# Patient Record
Sex: Female | Born: 1935 | Race: White | Hispanic: No | Smoking: Former smoker
Health system: Southern US, Community
[De-identification: ages and names within clinical notes are randomized; demographics above are authoritative.]

## PROBLEM LIST (undated history)

## (undated) DIAGNOSIS — I4891 Unspecified atrial fibrillation: Secondary | ICD-10-CM

## (undated) DIAGNOSIS — M1990 Unspecified osteoarthritis, unspecified site: Secondary | ICD-10-CM

## (undated) DIAGNOSIS — I10 Essential (primary) hypertension: Secondary | ICD-10-CM

## (undated) DIAGNOSIS — C801 Malignant (primary) neoplasm, unspecified: Secondary | ICD-10-CM

## (undated) DIAGNOSIS — F32A Depression, unspecified: Secondary | ICD-10-CM

## (undated) DIAGNOSIS — L900 Lichen sclerosus et atrophicus: Secondary | ICD-10-CM

## (undated) HISTORY — DX: Unspecified osteoarthritis, unspecified site: M19.90

## (undated) HISTORY — PX: APPENDECTOMY: SHX54

## (undated) HISTORY — PX: HERNIA REPAIR: SHX51

## (undated) HISTORY — DX: Malignant (primary) neoplasm, unspecified: C80.1

## (undated) HISTORY — DX: Lichen sclerosus et atrophicus: L90.0

## (undated) HISTORY — PX: TONSILLECTOMY: SUR1361

## (undated) HISTORY — PX: BOWEL RESECTION: SHX1257

---

## 1998-10-13 ENCOUNTER — Ambulatory Visit (HOSPITAL_COMMUNITY): Admission: RE | Admit: 1998-10-13 | Discharge: 1998-10-13 | Payer: Self-pay | Admitting: Obstetrics & Gynecology

## 2015-04-01 DIAGNOSIS — R928 Other abnormal and inconclusive findings on diagnostic imaging of breast: Secondary | ICD-10-CM | POA: Diagnosis not present

## 2015-04-19 DIAGNOSIS — H25813 Combined forms of age-related cataract, bilateral: Secondary | ICD-10-CM | POA: Diagnosis not present

## 2015-04-19 DIAGNOSIS — H3531 Nonexudative age-related macular degeneration: Secondary | ICD-10-CM | POA: Diagnosis not present

## 2015-06-18 DIAGNOSIS — R350 Frequency of micturition: Secondary | ICD-10-CM | POA: Diagnosis not present

## 2015-06-18 DIAGNOSIS — N390 Urinary tract infection, site not specified: Secondary | ICD-10-CM | POA: Diagnosis not present

## 2015-06-18 DIAGNOSIS — A499 Bacterial infection, unspecified: Secondary | ICD-10-CM | POA: Diagnosis not present

## 2015-06-18 DIAGNOSIS — J018 Other acute sinusitis: Secondary | ICD-10-CM | POA: Diagnosis not present

## 2015-08-16 DIAGNOSIS — L900 Lichen sclerosus et atrophicus: Secondary | ICD-10-CM | POA: Diagnosis not present

## 2015-08-16 DIAGNOSIS — L570 Actinic keratosis: Secondary | ICD-10-CM | POA: Diagnosis not present

## 2015-09-04 DIAGNOSIS — C801 Malignant (primary) neoplasm, unspecified: Secondary | ICD-10-CM

## 2015-09-04 HISTORY — PX: BREAST LUMPECTOMY: SHX2

## 2015-09-04 HISTORY — DX: Malignant (primary) neoplasm, unspecified: C80.1

## 2015-09-22 DIAGNOSIS — L570 Actinic keratosis: Secondary | ICD-10-CM | POA: Diagnosis not present

## 2015-09-22 DIAGNOSIS — L821 Other seborrheic keratosis: Secondary | ICD-10-CM | POA: Diagnosis not present

## 2015-11-01 DIAGNOSIS — H353132 Nonexudative age-related macular degeneration, bilateral, intermediate dry stage: Secondary | ICD-10-CM | POA: Diagnosis not present

## 2015-11-01 DIAGNOSIS — H25813 Combined forms of age-related cataract, bilateral: Secondary | ICD-10-CM | POA: Diagnosis not present

## 2015-11-30 DIAGNOSIS — H2512 Age-related nuclear cataract, left eye: Secondary | ICD-10-CM | POA: Diagnosis not present

## 2015-12-26 DIAGNOSIS — H2512 Age-related nuclear cataract, left eye: Secondary | ICD-10-CM | POA: Diagnosis not present

## 2015-12-26 DIAGNOSIS — H25812 Combined forms of age-related cataract, left eye: Secondary | ICD-10-CM | POA: Diagnosis not present

## 2016-01-03 DIAGNOSIS — H2511 Age-related nuclear cataract, right eye: Secondary | ICD-10-CM | POA: Diagnosis not present

## 2016-01-23 DIAGNOSIS — H2511 Age-related nuclear cataract, right eye: Secondary | ICD-10-CM | POA: Diagnosis not present

## 2016-01-23 DIAGNOSIS — H25811 Combined forms of age-related cataract, right eye: Secondary | ICD-10-CM | POA: Diagnosis not present

## 2016-04-30 DIAGNOSIS — N63 Unspecified lump in breast: Secondary | ICD-10-CM | POA: Diagnosis not present

## 2016-04-30 DIAGNOSIS — N6459 Other signs and symptoms in breast: Secondary | ICD-10-CM | POA: Diagnosis not present

## 2016-04-30 DIAGNOSIS — R928 Other abnormal and inconclusive findings on diagnostic imaging of breast: Secondary | ICD-10-CM | POA: Diagnosis not present

## 2016-04-30 DIAGNOSIS — R921 Mammographic calcification found on diagnostic imaging of breast: Secondary | ICD-10-CM | POA: Diagnosis not present

## 2016-04-30 DIAGNOSIS — Z09 Encounter for follow-up examination after completed treatment for conditions other than malignant neoplasm: Secondary | ICD-10-CM | POA: Diagnosis not present

## 2016-04-30 DIAGNOSIS — N6489 Other specified disorders of breast: Secondary | ICD-10-CM | POA: Diagnosis not present

## 2016-05-11 DIAGNOSIS — C50911 Malignant neoplasm of unspecified site of right female breast: Secondary | ICD-10-CM | POA: Diagnosis not present

## 2016-05-11 DIAGNOSIS — R928 Other abnormal and inconclusive findings on diagnostic imaging of breast: Secondary | ICD-10-CM | POA: Diagnosis not present

## 2016-05-11 DIAGNOSIS — D0511 Intraductal carcinoma in situ of right breast: Secondary | ICD-10-CM | POA: Diagnosis not present

## 2016-05-16 DIAGNOSIS — C50211 Malignant neoplasm of upper-inner quadrant of right female breast: Secondary | ICD-10-CM | POA: Diagnosis not present

## 2016-05-21 DIAGNOSIS — Z803 Family history of malignant neoplasm of breast: Secondary | ICD-10-CM | POA: Diagnosis not present

## 2016-05-21 DIAGNOSIS — C50211 Malignant neoplasm of upper-inner quadrant of right female breast: Secondary | ICD-10-CM | POA: Diagnosis not present

## 2016-05-21 DIAGNOSIS — Z8249 Family history of ischemic heart disease and other diseases of the circulatory system: Secondary | ICD-10-CM | POA: Diagnosis not present

## 2016-05-21 DIAGNOSIS — Z806 Family history of leukemia: Secondary | ICD-10-CM | POA: Diagnosis not present

## 2016-05-21 DIAGNOSIS — Z87891 Personal history of nicotine dependence: Secondary | ICD-10-CM | POA: Diagnosis not present

## 2016-05-21 DIAGNOSIS — Z171 Estrogen receptor negative status [ER-]: Secondary | ICD-10-CM | POA: Diagnosis not present

## 2016-05-21 DIAGNOSIS — Z801 Family history of malignant neoplasm of trachea, bronchus and lung: Secondary | ICD-10-CM | POA: Diagnosis not present

## 2016-05-23 DIAGNOSIS — R928 Other abnormal and inconclusive findings on diagnostic imaging of breast: Secondary | ICD-10-CM | POA: Diagnosis not present

## 2016-05-23 DIAGNOSIS — C50919 Malignant neoplasm of unspecified site of unspecified female breast: Secondary | ICD-10-CM | POA: Diagnosis not present

## 2016-05-23 DIAGNOSIS — C50911 Malignant neoplasm of unspecified site of right female breast: Secondary | ICD-10-CM | POA: Diagnosis not present

## 2016-05-23 DIAGNOSIS — C50211 Malignant neoplasm of upper-inner quadrant of right female breast: Secondary | ICD-10-CM | POA: Diagnosis not present

## 2016-05-24 DIAGNOSIS — D360 Benign neoplasm of lymph nodes: Secondary | ICD-10-CM | POA: Diagnosis not present

## 2016-05-24 DIAGNOSIS — C50911 Malignant neoplasm of unspecified site of right female breast: Secondary | ICD-10-CM | POA: Diagnosis not present

## 2016-05-24 DIAGNOSIS — Z170 Estrogen receptor positive status [ER+]: Secondary | ICD-10-CM | POA: Diagnosis not present

## 2016-06-01 DIAGNOSIS — M79671 Pain in right foot: Secondary | ICD-10-CM | POA: Diagnosis not present

## 2016-06-01 DIAGNOSIS — M19042 Primary osteoarthritis, left hand: Secondary | ICD-10-CM | POA: Diagnosis not present

## 2016-06-01 DIAGNOSIS — M159 Polyosteoarthritis, unspecified: Secondary | ICD-10-CM | POA: Diagnosis not present

## 2016-06-01 DIAGNOSIS — M79641 Pain in right hand: Secondary | ICD-10-CM | POA: Diagnosis not present

## 2016-06-01 DIAGNOSIS — M19041 Primary osteoarthritis, right hand: Secondary | ICD-10-CM | POA: Diagnosis not present

## 2016-06-01 DIAGNOSIS — M19072 Primary osteoarthritis, left ankle and foot: Secondary | ICD-10-CM | POA: Diagnosis not present

## 2016-06-01 DIAGNOSIS — M79642 Pain in left hand: Secondary | ICD-10-CM | POA: Diagnosis not present

## 2016-06-01 DIAGNOSIS — M4004 Postural kyphosis, thoracic region: Secondary | ICD-10-CM | POA: Diagnosis not present

## 2016-06-01 DIAGNOSIS — M19071 Primary osteoarthritis, right ankle and foot: Secondary | ICD-10-CM | POA: Diagnosis not present

## 2016-06-01 DIAGNOSIS — M79672 Pain in left foot: Secondary | ICD-10-CM | POA: Diagnosis not present

## 2016-06-19 DIAGNOSIS — Z170 Estrogen receptor positive status [ER+]: Secondary | ICD-10-CM | POA: Diagnosis not present

## 2016-06-19 DIAGNOSIS — C50411 Malignant neoplasm of upper-outer quadrant of right female breast: Secondary | ICD-10-CM | POA: Diagnosis not present

## 2016-07-04 DIAGNOSIS — C50211 Malignant neoplasm of upper-inner quadrant of right female breast: Secondary | ICD-10-CM | POA: Diagnosis not present

## 2016-07-04 DIAGNOSIS — Z79811 Long term (current) use of aromatase inhibitors: Secondary | ICD-10-CM | POA: Diagnosis not present

## 2016-07-04 DIAGNOSIS — Z87891 Personal history of nicotine dependence: Secondary | ICD-10-CM | POA: Diagnosis not present

## 2016-07-04 DIAGNOSIS — M1990 Unspecified osteoarthritis, unspecified site: Secondary | ICD-10-CM | POA: Diagnosis not present

## 2016-07-04 DIAGNOSIS — Z170 Estrogen receptor positive status [ER+]: Secondary | ICD-10-CM | POA: Diagnosis not present

## 2016-07-04 DIAGNOSIS — Z780 Asymptomatic menopausal state: Secondary | ICD-10-CM | POA: Diagnosis not present

## 2016-07-04 DIAGNOSIS — Z803 Family history of malignant neoplasm of breast: Secondary | ICD-10-CM | POA: Diagnosis not present

## 2016-07-31 DIAGNOSIS — C50211 Malignant neoplasm of upper-inner quadrant of right female breast: Secondary | ICD-10-CM | POA: Diagnosis not present

## 2016-07-31 DIAGNOSIS — M85852 Other specified disorders of bone density and structure, left thigh: Secondary | ICD-10-CM | POA: Diagnosis not present

## 2016-07-31 DIAGNOSIS — Z780 Asymptomatic menopausal state: Secondary | ICD-10-CM | POA: Diagnosis not present

## 2016-07-31 DIAGNOSIS — M810 Age-related osteoporosis without current pathological fracture: Secondary | ICD-10-CM | POA: Diagnosis not present

## 2016-10-09 DIAGNOSIS — Z803 Family history of malignant neoplasm of breast: Secondary | ICD-10-CM | POA: Diagnosis not present

## 2016-10-09 DIAGNOSIS — Z170 Estrogen receptor positive status [ER+]: Secondary | ICD-10-CM | POA: Diagnosis not present

## 2016-10-09 DIAGNOSIS — Z79811 Long term (current) use of aromatase inhibitors: Secondary | ICD-10-CM | POA: Diagnosis not present

## 2016-10-09 DIAGNOSIS — C50211 Malignant neoplasm of upper-inner quadrant of right female breast: Secondary | ICD-10-CM | POA: Diagnosis not present

## 2016-10-09 DIAGNOSIS — Z87891 Personal history of nicotine dependence: Secondary | ICD-10-CM | POA: Diagnosis not present

## 2017-03-26 DIAGNOSIS — H25813 Combined forms of age-related cataract, bilateral: Secondary | ICD-10-CM | POA: Diagnosis not present

## 2017-03-26 DIAGNOSIS — H26493 Other secondary cataract, bilateral: Secondary | ICD-10-CM | POA: Diagnosis not present

## 2017-03-26 DIAGNOSIS — H353132 Nonexudative age-related macular degeneration, bilateral, intermediate dry stage: Secondary | ICD-10-CM | POA: Diagnosis not present

## 2017-06-27 DIAGNOSIS — R928 Other abnormal and inconclusive findings on diagnostic imaging of breast: Secondary | ICD-10-CM | POA: Diagnosis not present

## 2017-06-27 DIAGNOSIS — N6489 Other specified disorders of breast: Secondary | ICD-10-CM | POA: Diagnosis not present

## 2017-06-27 DIAGNOSIS — C50211 Malignant neoplasm of upper-inner quadrant of right female breast: Secondary | ICD-10-CM | POA: Diagnosis not present

## 2017-07-04 DIAGNOSIS — Z79811 Long term (current) use of aromatase inhibitors: Secondary | ICD-10-CM | POA: Diagnosis not present

## 2017-07-04 DIAGNOSIS — C50211 Malignant neoplasm of upper-inner quadrant of right female breast: Secondary | ICD-10-CM | POA: Diagnosis not present

## 2017-07-04 DIAGNOSIS — R232 Flushing: Secondary | ICD-10-CM | POA: Diagnosis not present

## 2017-07-04 DIAGNOSIS — Z483 Aftercare following surgery for neoplasm: Secondary | ICD-10-CM | POA: Diagnosis not present

## 2017-07-04 DIAGNOSIS — Z170 Estrogen receptor positive status [ER+]: Secondary | ICD-10-CM | POA: Diagnosis not present

## 2017-07-04 DIAGNOSIS — M8580 Other specified disorders of bone density and structure, unspecified site: Secondary | ICD-10-CM | POA: Diagnosis not present

## 2017-08-26 DIAGNOSIS — H9191 Unspecified hearing loss, right ear: Secondary | ICD-10-CM | POA: Diagnosis not present

## 2017-08-26 DIAGNOSIS — H6121 Impacted cerumen, right ear: Secondary | ICD-10-CM | POA: Diagnosis not present

## 2017-10-08 DIAGNOSIS — M19011 Primary osteoarthritis, right shoulder: Secondary | ICD-10-CM | POA: Diagnosis not present

## 2017-10-08 DIAGNOSIS — R52 Pain, unspecified: Secondary | ICD-10-CM | POA: Diagnosis not present

## 2018-01-16 DIAGNOSIS — Z79811 Long term (current) use of aromatase inhibitors: Secondary | ICD-10-CM | POA: Diagnosis not present

## 2018-01-16 DIAGNOSIS — M8580 Other specified disorders of bone density and structure, unspecified site: Secondary | ICD-10-CM | POA: Diagnosis not present

## 2018-01-16 DIAGNOSIS — C50211 Malignant neoplasm of upper-inner quadrant of right female breast: Secondary | ICD-10-CM | POA: Diagnosis not present

## 2018-01-16 DIAGNOSIS — N951 Menopausal and female climacteric states: Secondary | ICD-10-CM | POA: Diagnosis not present

## 2018-01-16 DIAGNOSIS — Z170 Estrogen receptor positive status [ER+]: Secondary | ICD-10-CM | POA: Diagnosis not present

## 2018-01-16 DIAGNOSIS — Z853 Personal history of malignant neoplasm of breast: Secondary | ICD-10-CM | POA: Diagnosis not present

## 2018-01-16 DIAGNOSIS — Z87891 Personal history of nicotine dependence: Secondary | ICD-10-CM | POA: Diagnosis not present

## 2018-03-19 NOTE — Progress Notes (Signed)
[place] at [facility] [address] [place], [place] [address] [phone]  Date:  03/20/2018   Name:  Wendy Hebert   DOB:  Nov 30, 1932   MRN:  [id number]  PCP:  Darreld Mclean, MD    Chief Complaint: New Patient (Initial Visit) (mobility issue, trouble walking for about a year now)   History of Present Illness:  Wendy Hebert is a 84 y.o. very pleasant female patient who presents with the following:  She is from [place]. Moved to this area to attend [employer].  Moved away for a few years, but has spent most of her life here  She is married to Wendy Hebert They have 1 son who lives in [place] and a 108 yo grandson She works part time for the [employer]   She had a localized breast cancer and did a lumpectomy in 2017 No radiation or chemo needed   Her oncologist is Dr. Humphrey Rolls She does really see any other specialists  She has been in "super great health" until the last year.  Over the last year she has felt like walking is becoming more difficult for her She has had a hard time taking any estrogen inhibitors due to difficulty walking- she feels "like I just can't move" but has a hard time describing this as stiffness or pain   Apparently she was told that she had spine arthritis years ago Current sx have been going on for 8-9 months. She stopped all oncology meds on 6/10 She has not noted any improvement since she stopped the meds She is not sure if she is getting worse She does yoga once a week, and did a post- cancer fitness program as well in the past but stopped over the winter and never went back  She is not really using NSAIDs or other OTC meds on a regular basis as she is afraid of SE She will use aleve when "I'm really desperate"   She did have some labs done in May of this years- CMP, CBC- minor decrease in her GFR  She did see ortho back in February and had a shot in her shoulder but it did not seem to help    She does have lichen sclerosis on her pubic area - she uses clobetasol topically for this.  She was getting this from her GYN but wonders if I can refill it which is fine   Former smoker Not a heavy drinker   Patient Active Problem List   Diagnosis Date Noted  . Gait difficulty 03/20/2018  . Lichen sclerosus 32/95/1884  . History of right breast cancer 03/20/2018    Past Medical History:  Diagnosis Date  . Arthritis   . Cancer ([place]) 2017   1.6 lumpectomy  . Lichen sclerosus     Past Surgical History:  Procedure Laterality Date  . BOWEL RESECTION    . BREAST LUMPECTOMY  2017  . CESAREAN SECTION    . HERNIA REPAIR    . TONSILLECTOMY      Social History   Tobacco Use  . Smoking status: Former Research scientist (life sciences)  . Smokeless tobacco: Never Used  . Tobacco comment: 30+ years  Substance Use Topics  . Alcohol use: Yes    Comment: occasionally  . Drug use: Never    History reviewed. No pertinent family history.  Not on File  Medication list has been reviewed and updated.  Current Outpatient Medications on File  Prior to Visit  Medication Sig Dispense Refill  . Calcium 600-200 MG-UNIT tablet Take 1 tablet by mouth daily.    . Cholecalciferol (D3-50 PO) Take by mouth.    . DOCOSAHEXAENOIC ACID PO Take 1 g by mouth.    . Multiple Vitamin (MULTI-VITAMINS) TABS Take by mouth.    . Multiple Vitamins-Minerals (ICAPS AREDS 2 PO) Take by mouth.     No current facility-administered medications on file prior to visit.     Review of Systems:  As per HPI- otherwise negative. No fever or chills No CP or sob Denies any falls or any serious fear of falling    Physical Examination: Vitals:   03/20/18 1408  BP: 136/78  Pulse: 79  Resp: 16  SpO2: 98%   Vitals:   03/20/18 1408  Weight: 148 lb (67.1 kg)  Height: 5' 1.75" (1.568 m)   Body mass index is 27.29 kg/m. Ideal Body Weight: Weight in (lb) to have BMI = 25: 135.3  GEN: WDWN, NAD, Non-toxic, A & O x 3, well  appearing older lady HEENT: Atraumatic, Normocephalic. Neck supple. No masses, No LAD.  Bilateral TM wnl, oropharynx normal.  PEERL,EOMI.   Ears and Nose: No external deformity. CV: RRR, No M/G/R. No JVD. No thrill. No extra heart sounds. PULM: CTA B, no wheezes, crackles, rhonchi. No retractions. No resp. distress. No accessory muscle use. ABD: S, NT, ND EXTR: No c/c/e NEURO her gait is slow but otherwise appears normal Normal strength of her extremities She has difficulty abducting her right shoulder due to shoulder injury  PSYCH: Normally interactive. Conversant. Not depressed or anxious appearing.  Calm demeanor.    Assessment and Plan: Gait difficulty - Plan: Ambulatory referral to Neurology  Lichen sclerosus  History of right breast cancer  Encounter for medical examination to establish care  Establishing care today History of breast cancer s/p treatment.  Pt attributes onset of her current gait difficulty with use of post- cancer medications.  We are not sure if there is really a connection. May be due to deconditioning and concern about falls.  In any case will refer to neurology for an opinion regarding her gait.  In the meantime encouraged her to be as active and mobile as possible and she will try   Signed Lamar Blinks, MD

## 2018-03-20 ENCOUNTER — Ambulatory Visit: Payer: Medicare Other | Admitting: Family Medicine

## 2018-03-20 ENCOUNTER — Encounter: Payer: Self-pay | Admitting: Family Medicine

## 2018-03-20 VITALS — BP 136/78 | HR 79 | Resp 16 | Ht 61.75 in | Wt 148.0 lb

## 2018-03-20 DIAGNOSIS — Z0000 Encounter for general adult medical examination without abnormal findings: Secondary | ICD-10-CM

## 2018-03-20 DIAGNOSIS — Z853 Personal history of malignant neoplasm of breast: Secondary | ICD-10-CM | POA: Insufficient documentation

## 2018-03-20 DIAGNOSIS — L900 Lichen sclerosus et atrophicus: Secondary | ICD-10-CM | POA: Diagnosis not present

## 2018-03-20 DIAGNOSIS — R269 Unspecified abnormalities of gait and mobility: Secondary | ICD-10-CM | POA: Diagnosis not present

## 2018-03-20 NOTE — Patient Instructions (Addendum)
I am going to refer you to neurology to help [place] figure out why your gait has slowed.  Also, I think you are ok to use tylenol as needed, and also naprosyn for more severe pain- be more sparing with this med  Please go back to your cancer fit exercise program  Also continue to do your yoga program and try to keep moving as much as you are able   Please come and see me in about 4 months to check in, sooner if you need anything

## 2018-03-21 ENCOUNTER — Encounter: Payer: Self-pay | Admitting: Family Medicine

## 2018-03-26 ENCOUNTER — Encounter: Payer: Self-pay | Admitting: Neurology

## 2018-04-01 DIAGNOSIS — H353132 Nonexudative age-related macular degeneration, bilateral, intermediate dry stage: Secondary | ICD-10-CM | POA: Diagnosis not present

## 2018-04-01 DIAGNOSIS — H25813 Combined forms of age-related cataract, bilateral: Secondary | ICD-10-CM | POA: Diagnosis not present

## 2018-04-01 DIAGNOSIS — H26493 Other secondary cataract, bilateral: Secondary | ICD-10-CM | POA: Diagnosis not present

## 2018-05-21 NOTE — Progress Notes (Signed)
NEUROLOGY CONSULTATION NOTE  Wendy Hebert MRN: [id number] DOB: 1933/01/21  Referring provider: Lamar Blinks, MD Primary care provider: Lamar Blinks, MD  Reason for consult:  Abnormal gait  HISTORY OF PRESENT ILLNESS: Wendy Hebert is an 84 year old female with arthritis and history of breast cancer who presents for abnormal gait.  She is accompanied by her husband who supplements history.  History also supplemented by referring provider's note.  She was treated for breast cancer in late 2017 and started anastrozole.  She reports difficulty with ambulating since .  She just couldn't move her legs.  She reports associated pain but she couldn't move due to immobility,not due to the pain.  She denies dizziness, visual disturbance, muscle weakness or fatigue.  She denies tremor.  She thinks it is due to her hormone therapy.  PAST MEDICAL HISTORY: Past Medical History:  Diagnosis Date  . Arthritis   . Cancer ([place]) 2017   1.6 lumpectomy  . Lichen sclerosus     PAST SURGICAL HISTORY: Past Surgical History:  Procedure Laterality Date  . BOWEL RESECTION    . BREAST LUMPECTOMY  2017  . CESAREAN SECTION    . HERNIA REPAIR    . TONSILLECTOMY      MEDICATIONS: Current Outpatient Medications on File Prior to Visit  Medication Sig Dispense Refill  . Calcium 600-200 MG-UNIT tablet Take 1 tablet by mouth daily.    . Cholecalciferol (D3-50 PO) Take by mouth.    . DOCOSAHEXAENOIC ACID PO Take 1 g by mouth.    . Multiple Vitamin (MULTI-VITAMINS) TABS Take by mouth.    . Multiple Vitamins-Minerals (ICAPS AREDS 2 PO) Take by mouth.     No current facility-administered medications on file prior to visit.     ALLERGIES: Not on File  FAMILY HISTORY: No family history on file.   SOCIAL HISTORY: Social History   Socioeconomic History  . Marital status: Married    Spouse name: Not on file  . Number of children: Not on file  . Years of education: Not on file  . Highest  education level: Not on file  Occupational History  . Not on file  Social Needs  . Financial resource strain: Not on file  . Food insecurity:    Worry: Not on file    Inability: Not on file  . Transportation needs:    Medical: Not on file    Non-medical: Not on file  Tobacco Use  . Smoking status: Former Research scientist (life sciences)  . Smokeless tobacco: Never Used  . Tobacco comment: 30+ years  Substance and Sexual Activity  . Alcohol use: Yes    Comment: occasionally  . Drug use: Never  . Sexual activity: Not on file  Lifestyle  . Physical activity:    Days per week: Not on file    Minutes per session: Not on file  . Stress: Not on file  Relationships  . Social connections:    Talks on phone: Not on file    Gets together: Not on file    Attends religious service: Not on file    Active member of club or organization: Not on file    Attends meetings of clubs or organizations: Not on file    Relationship status: Not on file  . Intimate partner violence:    Fear of current or ex partner: Not on file    Emotionally abused: Not on file    Physically abused: Not on file    Forced sexual  activity: Not on file  Other Topics Concern  . Not on file  Social History Narrative  . Not on file    REVIEW OF SYSTEMS: Constitutional: No fevers, chills, or sweats, no generalized fatigue, change in appetite Eyes: No visual changes, double vision, eye pain Ear, nose and throat: No hearing loss, ear pain, nasal congestion, sore throat Cardiovascular: No chest pain, palpitations Respiratory:  No shortness of breath at rest or with exertion, wheezes GastrointestinaI: No nausea, vomiting, diarrhea, abdominal pain, fecal incontinence Genitourinary:  No dysuria, urinary retention or frequency Musculoskeletal:  No neck pain, back pain Integumentary: No rash, pruritus, skin lesions Neurological: as above Psychiatric: No depression, insomnia, anxiety Endocrine: No palpitations, fatigue, diaphoresis, mood  swings, change in appetite, change in weight, increased thirst Hematologic/Lymphatic:  No purpura, petechiae. Allergic/Immunologic: no itchy/runny eyes, nasal congestion, recent allergic reactions, rashes  PHYSICAL EXAM: Blood pressure 132/70, pulse 84, height 5' 1.5" (1.562 m), weight 148 lb (67.1 kg), SpO2 97 %. General: No acute distress.  Patient appears well-groomed.   Head:  Normocephalic/atraumatic Eyes:  fundi examined but not visualized Neck: supple, no paraspinal tenderness, full range of motion Back: No paraspinal tenderness Heart: regular rate and rhythm Lungs: Clear to auscultation bilaterally. Vascular: No carotid bruits. Neurological Exam: Mental status: alert and oriented to person, place, and time, recent and remote memory intact, fund of knowledge intact, attention and concentration intact, speech fluent and not dysarthric, language intact. Cranial nerves: CN I: not tested CN II: pupils equal, round and reactive to light, visual fields intact CN III, IV, VI:  full range of motion, no nystagmus, no ptosis CN V: facial sensation intact CN VII: upper and lower face symmetric CN VIII: hearing intact CN IX, X: gag intact, uvula midline CN XI: sternocleidomastoid and trapezius muscles intact CN XII: tongue midline Bulk & Tone: normal, no fasciculations. Motor:  5/5 throughout, no bradykinesia Sensation:  pinprick and vibration sensation intact. Deep Tendon Reflexes:  2+ throughout, toes downgoing.  Finger to nose testing:  Without dysmetria.  Heel to shin:  Without dysmetria.  Gait:  Mildly wide-based but overall normal station and stride.  Able to turn and tandem walk. Romberg negative.  IMPRESSION: Abnormal gait.  Specifically she reports at times not able to move her legs, possibly describing freezing.  She does not exhibit any freezing or parkinsonism on exam.  She does not exhibit any focal or symmetric weakness, neuropathy, radiculopathy, lumbar stenosis, or signs  of myelopathy.  Her neurologic exam is unremarkable.  PLAN: As her exam is unremarkable, I have no further recommendations.  She participates in yoga and I encourage her to continue.  If she should have any worsening symptoms, I advised her to make a follow up appointment for re-evaluation.  Thank you for allowing me to take part in the care of this patient.  40 minutes spent face to face with patient, over 50% spent discussing management.  Metta Clines, DO  CC: Lamar Blinks, MD

## 2018-05-22 ENCOUNTER — Ambulatory Visit: Payer: Medicare Other | Admitting: Neurology

## 2018-05-22 ENCOUNTER — Encounter

## 2018-05-22 ENCOUNTER — Encounter: Payer: Self-pay | Admitting: Neurology

## 2018-05-22 VITALS — BP 132/70 | HR 84 | Ht 61.5 in | Wt 148.0 lb

## 2018-05-22 DIAGNOSIS — R269 Unspecified abnormalities of gait and mobility: Secondary | ICD-10-CM

## 2018-05-22 NOTE — Patient Instructions (Signed)
I don't appreciate any concerns on your exam.  If symptoms worsen, please follow up for re-evaluation

## 2018-06-04 DIAGNOSIS — Z170 Estrogen receptor positive status [ER+]: Secondary | ICD-10-CM | POA: Diagnosis not present

## 2018-06-04 DIAGNOSIS — M8580 Other specified disorders of bone density and structure, unspecified site: Secondary | ICD-10-CM | POA: Diagnosis not present

## 2018-06-04 DIAGNOSIS — C50211 Malignant neoplasm of upper-inner quadrant of right female breast: Secondary | ICD-10-CM | POA: Diagnosis not present

## 2018-06-04 DIAGNOSIS — M791 Myalgia, unspecified site: Secondary | ICD-10-CM | POA: Diagnosis not present

## 2018-06-04 DIAGNOSIS — R52 Pain, unspecified: Secondary | ICD-10-CM | POA: Diagnosis not present

## 2018-06-12 DIAGNOSIS — M47816 Spondylosis without myelopathy or radiculopathy, lumbar region: Secondary | ICD-10-CM | POA: Diagnosis not present

## 2018-06-12 DIAGNOSIS — M2550 Pain in unspecified joint: Secondary | ICD-10-CM | POA: Diagnosis not present

## 2018-06-12 DIAGNOSIS — M1990 Unspecified osteoarthritis, unspecified site: Secondary | ICD-10-CM | POA: Diagnosis not present

## 2018-06-12 DIAGNOSIS — R29898 Other symptoms and signs involving the musculoskeletal system: Secondary | ICD-10-CM | POA: Diagnosis not present

## 2018-07-03 LAB — CBC AND DIFFERENTIAL
HEMATOCRIT: 41 (ref 36–46)
Hemoglobin: 14.1 (ref 12.0–16.0)
Platelets: 271 (ref 150–399)
WBC: 6.7

## 2018-07-03 LAB — LIPID PANEL
CHOLESTEROL: 188 (ref 0–200)
HDL: 71 — AB (ref 35–70)
LDL Cholesterol: 93
TRIGLYCERIDES: 120 (ref 40–160)

## 2018-07-03 LAB — BASIC METABOLIC PANEL
BUN: 16 (ref 4–21)
CREATININE: 1.1 (ref 0.5–1.1)
Glucose: 100
POTASSIUM: 4.5 (ref 3.4–5.3)
SODIUM: 141 (ref 137–147)

## 2018-07-03 LAB — HEPATIC FUNCTION PANEL
ALT: 20 (ref 7–35)
AST: 21 (ref 13–35)
Alkaline Phosphatase: 83 (ref 25–125)
BILIRUBIN, TOTAL: 0.5

## 2018-07-03 LAB — VITAMIN D 25 HYDROXY (VIT D DEFICIENCY, FRACTURES): Vit D, 25-Hydroxy: 38.5

## 2018-07-03 LAB — VITAMIN B12: VITAMIN B 12: 1501

## 2018-07-15 ENCOUNTER — Encounter: Payer: Self-pay | Admitting: Family Medicine

## 2018-07-15 DIAGNOSIS — Z9889 Other specified postprocedural states: Secondary | ICD-10-CM | POA: Diagnosis not present

## 2018-07-15 DIAGNOSIS — Z79811 Long term (current) use of aromatase inhibitors: Secondary | ICD-10-CM | POA: Diagnosis not present

## 2018-07-15 DIAGNOSIS — Z170 Estrogen receptor positive status [ER+]: Secondary | ICD-10-CM | POA: Diagnosis not present

## 2018-07-15 DIAGNOSIS — C50211 Malignant neoplasm of upper-inner quadrant of right female breast: Secondary | ICD-10-CM | POA: Diagnosis not present

## 2018-07-15 DIAGNOSIS — R928 Other abnormal and inconclusive findings on diagnostic imaging of breast: Secondary | ICD-10-CM | POA: Diagnosis not present

## 2018-07-15 DIAGNOSIS — Z0000 Encounter for general adult medical examination without abnormal findings: Secondary | ICD-10-CM | POA: Diagnosis not present

## 2018-07-20 NOTE — Progress Notes (Signed)
[place] at [facility] [address], [place], [place] [address] [phone]  Date:  07/21/2018   Name:  Wendy Hebert   DOB:  07/09/1933   MRN:  [id number]  PCP:  Darreld Mclean, MD    Chief Complaint: Gait Difficulty (4 month follow up)   History of Present Illness:  Wendy Hebert is a 84 y.o. very pleasant female patient who presents with the following:  Periodic follow-up visit today Last seen here in July as a new patient at which time she was having a hard time walking: She had a localized breast cancer and did a lumpectomy in 2017 No radiation or chemo needed Her oncologist is Dr. Humphrey Rolls She does really see any other specialists  She has been in "super great health" until the last year.  Over the last year she has felt like walking is becoming more difficult for her She has had a hard time taking any estrogen inhibitors due to difficulty walking- she feels "like I just can't move" but has a hard time describing this as stiffness or pain  Apparently she was told that she had spine arthritis years ago Current sx have been going on for 8-9 months. She stopped all oncology meds on 6/10 She has not noted any improvement since she stopped the meds She is not sure if she is getting worse She does yoga once a week, and did a post- cancer fitness program as well in the past but stopped over the winter and never went back//////////////////////////////////////// History of breast cancer s/p treatment.  Pt attributes onset of her current gait difficulty with use of post- cancer medications.  We are not sure if there is really a connection. May be due to deconditioning and concern about falls.  In any case will refer to neurology for an opinion regarding her gait.  In the meantime encouraged her to be as active and mobile as possible and she will try   She did see neurology, Dr. Tomi Likens- from their visit in  September: IMPRESSION: Abnormal gait.  Specifically she reports at times not able to move her legs, possibly describing freezing.  She does not exhibit any freezing or parkinsonism on exam.  She does not exhibit any focal or symmetric weakness, neuropathy, radiculopathy, lumbar stenosis, or signs of myelopathy.  Her neurologic exam is unremarkable. PLAN: As her exam is unremarkable, I have no further recommendations.  She participates in yoga and I encourage her to continue.  If she should have any worsening symptoms, I advised her to make a follow up appointment for re-evaluation.  shingrix- she is not interested in doing this   She is part of a study regarding her mobility through [place] She takes naprosyn as needed  She does have pains in her joints, but this is not the entire problem - she may also just fee like "my legs are made of lead"   Recent mammogram was normal  She is not taking any estrogen inhibitor currently - she is not interested in doing any further therapy at this time Per most recent oncology note: Assessment and Plan: 84 year old female with 1. 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 Final pathology revealed a T1 disease. Nodes were negative. Patient's tumor was ER +99% PR +99% HER-2/neu was equivocal. Oncotype DX revealed a low risk score. Patient was offered adjuvant radiation therapy but she has declined. Patient had been  on tamoxifen 20 mg daily. But now she is declining any further therapy. She was having vaginal discharge because of the tamoxifen. She did not notice any blood. But she is very concerned about the discharge and all of the side effects associated. Extensive discussion regarding the need for adjuvant therapy since she has not had adjuvant radiation therapy and her risk of recurrence. 2. Myalgias and arthralgias: Unclear etiology. We will refer her to rheumatology 3. Bone health: Bone density scan in November 2017 showed  osteopenia. Patient declines bisphosphonates. 4. Follow-up: Per patient wishes she would like to come in once a year. She will call us if she needs to be seen sooner Patient states she understands and agrees with today's plan. I answered all questions. She knows to call with any questions or concerns should they arise prior to the next scheduled appointment. Time spent in appointment 25 minutes with greater than 50% of the time spent in counseling and coordination of care. Thank you very much for referral of this delightful lady. I look forward to working with you.  She is also seeing rheumatology at [facility]  Flu shot is done  Bone density:  Done in 2017, she does not wish to repeat today   She did have pneumonia vaccine 4-5 years ago   [place]- nothing  Patient Active Problem List   Diagnosis Date Noted  . Gait difficulty 03/20/2018  . Lichen sclerosus 82/42/3536  . History of right breast cancer 03/20/2018    Past Medical History:  Diagnosis Date  . Arthritis   . Cancer ([place]) 2017   1.6 lumpectomy  . Lichen sclerosus     Past Surgical History:  Procedure Laterality Date  . BOWEL RESECTION    . BREAST LUMPECTOMY  2017  . CESAREAN SECTION    . HERNIA REPAIR    . TONSILLECTOMY      Social History   Tobacco Use  . Smoking status: Former Research scientist (life sciences)  . Smokeless tobacco: Never Used  . Tobacco comment: 30+ years  Substance Use Topics  . Alcohol use: Yes    Comment: occasionally  . Drug use: Never    History reviewed. No pertinent family history.  Not on File  Medication list has been reviewed and updated.  Current Outpatient Medications on File Prior to Visit  Medication Sig Dispense Refill  . Ascorbic Acid (VITAMIN C) 1000 MG tablet Take 1,000 mg by mouth daily.    . Calcium 600-200 MG-UNIT tablet Take 1 tablet by mouth daily.    . Cholecalciferol (D3-50 PO) Take by mouth.    . Multiple Vitamin (MULTI-VITAMINS) TABS Take by mouth.    . Omega-3 Fatty Acids (RA FISH OIL)  1000 MG CAPS Take 3 capsules by mouth daily.      No current facility-administered medications on file prior to visit.     Review of Systems:  As per HPI- otherwise negative.   Physical Examination: Vitals:   07/21/18 1411  BP: 136/60  Pulse: 86  Resp: 16  SpO2: 99%   Vitals:   07/21/18 1411  Weight: 150 lb (68 kg)  Height: 5' 1.5" (1.562 m)   Body mass index is 27.88 kg/m. Ideal Body Weight: Weight in (lb) to have BMI = 25: 134.2  GEN: WDWN, NAD, Non-toxic, A & O x 3, overweight, looks well  HEENT: Atraumatic, Normocephalic. Neck supple. No masses, No LAD. Ears and Nose: No external deformity. CV: RRR, No M/G/R. No JVD. No thrill. No extra heart sounds. PULM: CTA  B, no wheezes, crackles, rhonchi. No retractions. No resp. distress. No accessory muscle use. ABD: S, NT, ND EXTR: No c/c/e NEURO Normal gait PSYCH: Normally interactive. Conversant. Not depressed or anxious appearing.  Calm demeanor.  Pt also mentiones vaginal discharge thought due to tamoxifen use- no bleeding Non- speculum exam today is normal for age. Does show atrophy  AK on her left shin x2 and on her left brow x1 VC obtained LN cryotherapy to AK x3 each Pt tolerated well, no blood loss and no complications   Assessment and Plan: Arthralgia, unspecified joint - Plan: traMADol (ULTRAM) 50 MG tablet, DISCONTINUED: traMADol (ULTRAM) 50 MG tablet  Actinic keratitis, unspecified laterality  Vaginal discharge  Following up today She notes diffuse joint pains and stiffness Will have her try tramadol as needed-cautioned regarding sedation, use sparingly for worst pain days  She plans to see GYN if her vaginal discharge does not resolve now that she is off tamoxifen    Signed Lamar Blinks, MD

## 2018-07-21 ENCOUNTER — Ambulatory Visit (INDEPENDENT_AMBULATORY_CARE_PROVIDER_SITE_OTHER): Payer: Medicare Other | Admitting: Family Medicine

## 2018-07-21 ENCOUNTER — Encounter: Payer: Self-pay | Admitting: Family Medicine

## 2018-07-21 VITALS — BP 136/60 | HR 86 | Resp 16 | Ht 61.5 in | Wt 150.0 lb

## 2018-07-21 DIAGNOSIS — N898 Other specified noninflammatory disorders of vagina: Secondary | ICD-10-CM | POA: Diagnosis not present

## 2018-07-21 DIAGNOSIS — H16139 Photokeratitis, unspecified eye: Secondary | ICD-10-CM | POA: Diagnosis not present

## 2018-07-21 DIAGNOSIS — M2550 Pain in unspecified joint: Secondary | ICD-10-CM

## 2018-07-21 MED ORDER — TRAMADOL HCL 50 MG PO TABS: 50.0000 mg | ORAL_TABLET | Freq: Two times a day (BID) | ORAL | 0 refills | Status: DC | PRN

## 2018-07-21 NOTE — Patient Instructions (Addendum)
It was good to see you today! Take care, let's plan to visit in about 6 months  I gave you an rx for tramadol to try for pain as needed Use this med sparingly as it can be sedating or habit forming

## 2018-07-22 ENCOUNTER — Encounter: Payer: Self-pay | Admitting: Family Medicine

## 2018-07-22 LAB — CHLORIDE: Chloride: 102

## 2018-07-22 LAB — ESTIMATED GFR: EGFR (Non-African Amer.): 47

## 2018-07-22 LAB — CO2, TOTAL: Carbon Dioxide, Total: 23

## 2018-07-22 LAB — CALCIUM: CALCIUM: 9.3

## 2018-07-22 LAB — PROTEIN, TOTAL: Protein: 6.6

## 2018-08-04 DIAGNOSIS — M159 Polyosteoarthritis, unspecified: Secondary | ICD-10-CM | POA: Diagnosis not present

## 2018-08-04 DIAGNOSIS — R29898 Other symptoms and signs involving the musculoskeletal system: Secondary | ICD-10-CM | POA: Diagnosis not present

## 2018-09-17 DIAGNOSIS — Z124 Encounter for screening for malignant neoplasm of cervix: Secondary | ICD-10-CM | POA: Diagnosis not present

## 2018-09-18 DIAGNOSIS — M1612 Unilateral primary osteoarthritis, left hip: Secondary | ICD-10-CM | POA: Diagnosis not present

## 2018-09-18 DIAGNOSIS — M25511 Pain in right shoulder: Secondary | ICD-10-CM | POA: Diagnosis not present

## 2018-09-18 DIAGNOSIS — M1712 Unilateral primary osteoarthritis, left knee: Secondary | ICD-10-CM | POA: Diagnosis not present

## 2018-09-18 DIAGNOSIS — M25562 Pain in left knee: Secondary | ICD-10-CM | POA: Diagnosis not present

## 2018-09-18 DIAGNOSIS — M25552 Pain in left hip: Secondary | ICD-10-CM | POA: Diagnosis not present

## 2018-09-19 DIAGNOSIS — M1712 Unilateral primary osteoarthritis, left knee: Secondary | ICD-10-CM | POA: Diagnosis not present

## 2018-09-21 DIAGNOSIS — M6080 Other myositis, unspecified site: Secondary | ICD-10-CM | POA: Diagnosis not present

## 2018-09-21 DIAGNOSIS — W19XXXA Unspecified fall, initial encounter: Secondary | ICD-10-CM | POA: Diagnosis not present

## 2018-09-21 DIAGNOSIS — M546 Pain in thoracic spine: Secondary | ICD-10-CM | POA: Diagnosis not present

## 2018-09-23 DIAGNOSIS — M5126 Other intervertebral disc displacement, lumbar region: Secondary | ICD-10-CM | POA: Diagnosis not present

## 2018-09-23 DIAGNOSIS — R52 Pain, unspecified: Secondary | ICD-10-CM | POA: Diagnosis not present

## 2018-09-23 DIAGNOSIS — J90 Pleural effusion, not elsewhere classified: Secondary | ICD-10-CM | POA: Diagnosis not present

## 2018-09-23 DIAGNOSIS — S2241XA Multiple fractures of ribs, right side, initial encounter for closed fracture: Secondary | ICD-10-CM | POA: Diagnosis not present

## 2018-09-23 DIAGNOSIS — N390 Urinary tract infection, site not specified: Secondary | ICD-10-CM | POA: Diagnosis not present

## 2018-09-23 DIAGNOSIS — R109 Unspecified abdominal pain: Secondary | ICD-10-CM | POA: Diagnosis not present

## 2018-09-23 DIAGNOSIS — W19XXXA Unspecified fall, initial encounter: Secondary | ICD-10-CM | POA: Diagnosis not present

## 2018-09-23 DIAGNOSIS — S3991XA Unspecified injury of abdomen, initial encounter: Secondary | ICD-10-CM | POA: Diagnosis not present

## 2018-09-23 DIAGNOSIS — R1011 Right upper quadrant pain: Secondary | ICD-10-CM | POA: Diagnosis not present

## 2018-09-25 ENCOUNTER — Ambulatory Visit (INDEPENDENT_AMBULATORY_CARE_PROVIDER_SITE_OTHER): Payer: Medicare Other | Admitting: Family Medicine

## 2018-09-25 ENCOUNTER — Encounter: Payer: Self-pay | Admitting: Family Medicine

## 2018-09-25 ENCOUNTER — Telehealth: Payer: Self-pay | Admitting: Cardiovascular Disease

## 2018-09-25 ENCOUNTER — Ambulatory Visit (HOSPITAL_BASED_OUTPATIENT_CLINIC_OR_DEPARTMENT_OTHER)
Admission: RE | Admit: 2018-09-25 | Discharge: 2018-09-25 | Disposition: A | Discharge status: Home / Self Care | Payer: Medicare Other | Source: Ambulatory Visit | Attending: Family Medicine | Admitting: Family Medicine

## 2018-09-25 VITALS — BP 130/80 | HR 100 | Temp 97.6°F | Resp 16 | Ht 61.5 in | Wt 145.0 lb

## 2018-09-25 DIAGNOSIS — S2241XA Multiple fractures of ribs, right side, initial encounter for closed fracture: Secondary | ICD-10-CM

## 2018-09-25 DIAGNOSIS — K5903 Drug induced constipation: Secondary | ICD-10-CM | POA: Insufficient documentation

## 2018-09-25 DIAGNOSIS — I499 Cardiac arrhythmia, unspecified: Secondary | ICD-10-CM | POA: Diagnosis not present

## 2018-09-25 DIAGNOSIS — S2231XA Fracture of one rib, right side, initial encounter for closed fracture: Secondary | ICD-10-CM | POA: Diagnosis not present

## 2018-09-25 DIAGNOSIS — I4891 Unspecified atrial fibrillation: Secondary | ICD-10-CM

## 2018-09-25 DIAGNOSIS — S3991XA Unspecified injury of abdomen, initial encounter: Secondary | ICD-10-CM | POA: Diagnosis not present

## 2018-09-25 MED ORDER — METOPROLOL TARTRATE 25 MG PO TABS: 25.0000 mg | ORAL_TABLET | Freq: Two times a day (BID) | ORAL | 5 refills | Status: DC

## 2018-09-25 MED ORDER — DOXYCYCLINE HYCLATE 100 MG PO CAPS: 100.0000 mg | ORAL_CAPSULE | Freq: Two times a day (BID) | ORAL | 0 refills | Status: DC

## 2018-09-25 MED ORDER — APIXABAN 5 MG PO TABS: 5.0000 mg | ORAL_TABLET | Freq: Two times a day (BID) | ORAL | 5 refills | Status: DC

## 2018-09-25 NOTE — Patient Instructions (Addendum)
You might try a stool softener such as colace If not effective you might try some miralax Let me know if this does not get better   You are in atrial fibrillation- this means your heart is beating too fast and not in a regular rhythm We are going to use metoprolol to slow down your heart rate eliquis to prevent a clot  We are going to set you up to see cardiology for your atrial fib as well  You may be getting pneumonia- doxycycline to treat this  Try to take deep breaths several times a day to keep your lungs open   We will check your thyroid today to make sure this has not triggered the atrial fibrillation

## 2018-09-25 NOTE — Telephone Encounter (Signed)
Dr Oval Linsey spoke with Dr Edilia Bo regarding patient and new onset Afib. Per Dr Oval Linsey patient to be seen within 1 week. Message has been sent to Afib clinic to see about arranging appointment.

## 2018-09-25 NOTE — Progress Notes (Signed)
[place] at [facility] [address], [place], [place] [address] [phone]  Date:  09/25/2018   Name:  Wendy Hebert   DOB:  1933/02/28   MRN:  [id number]  PCP:  Darreld Mclean, MD    Chief Complaint: Constipation (have no used bathroom since 1/19) and Fall (fell saturday twice, rib pain, went to ER at [place], had CT)   History of Present Illness:  Wendy Hebert is a 85 y.o. very pleasant female patient who presents with the following:  Patient with history of breast cancer status post lumpectomy in 2017.  Otherwise generally in good health  Here today with a concern of recent fall, rib fracture, and constipation Today is Thursday Thursday one week ago she went to [facility] and they gave her oxycodone for knee pain She saw her ortho on Friday and got a shot in her knee-this did seem to help some  On Saturday she had a fall at home. She actually fell twice-  fell the first time in the garage.  She is not really sure what caused her to fall.  She did not have any neurologic symptoms to suggest a TIA or stroke.  She does not think she lost consciousness, she hit her right side/ribs but did not really seem to be hurt.  Then later that afternoon she started to fall while walking up some steps but her husband caught her. Later that night she noted pain in her right side but was not too worried about it, figured she had bruised ribs  The next day-Sunday-she went to [facility] again as the pain seemed to move form her back to her right ribs and intensified She had x-rays and they did not see a fracture on plain film  Then Monday night/early Tuesday she went to the [facility] regional ER because she was having more pain in her side- she got concerned that she might have a "kink in my bowel"  They did a CT which showed rib fractures but not any bowel abnl  IMPRESSION: 1. Nondisplaced right posterior tenth and  eleventh rib fractures. 2. Small, low-density sympathetic right pleural effusion with atelectasis. 3. No evidence of intra-abdominal injury. 4. Large left paracentral to foraminal extrusion at L3-4.  She was given IM Rocephin, Vicodin, asked to see me and also ortho regarding her bulging disc She was also given keflex for possible UTI; she is taking 500 mg TID currently  (She did have a fall about 28 years ago, and then developed "a kink" in her bowel and had a partial bowel resection.  We think this was small bowel, but I don't have records as this was so long ago.  At that time she was told her obstruction developed because of adhesions from a cesarean section)  [place] is concerned that she could develop a bowel obstruction again, she has not had a bowel movement in about 4 days She did take one laxative pill a few days ago but it not sure what it was - did not really seem to help  No belly pain She is eating normally   Creat was 1.06 at the ER  Comprehensive Metabolic Panel  Result Value Ref Range  Sodium 136 135 - 146 MMOL/L  Potassium 4.4 3.5 - 5.3 MMOL/L  Chloride 105 98 - 110 MMOL/L  CO2 22 (L) 23 - 30 MMOL/L  BUN 22 8 - 24 MG/DL  Glucose 122 (H) 70 - 99 MG/DL  Creatinine 1.06 0.50 - 1.50 MG/DL  Calcium 8.8 8.5 - 10.5 MG/DL  Total Protein 7.0 6.0 - 8.3 G/DL  Albumin 4.2 3.5 - 5.0 G/DL  Total Bilirubin 0.6 0.1 - 1.2 MG/DL  Alkaline Phosphatase 87 25 - 125 IU/L or U/L  AST (SGOT) 21 5 - 40 IU/L or U/L  ALT (SGPT) 41 5 - 50 IU/L or U/L  Anion Gap 9 4 - 14 MMOL/L  Est. GFR Non-African American 49 (L) >=60 ML/MIN/1.73 M*2  Est. GFR African American 56 (L) >=60 ML/MIN/1.73 M*2    Patient Active Problem List   Diagnosis Date Noted  . Gait difficulty 03/20/2018  . Lichen sclerosus 35/36/1443  . History of right breast cancer 03/20/2018    Past Medical History:  Diagnosis Date  . Arthritis   . Cancer ([place]) 2017   1.6 lumpectomy  . Lichen sclerosus     Past Surgical  History:  Procedure Laterality Date  . BOWEL RESECTION    . BREAST LUMPECTOMY  2017  . CESAREAN SECTION    . HERNIA REPAIR    . TONSILLECTOMY      Social History   Tobacco Use  . Smoking status: Former Research scientist (life sciences)  . Smokeless tobacco: Never Used  . Tobacco comment: 30+ years  Substance Use Topics  . Alcohol use: Yes    Comment: occasionally  . Drug use: Never    No family history on file.  Not on File  Medication list has been reviewed and updated.  Current Outpatient Medications on File Prior to Visit  Medication Sig Dispense Refill  . Ascorbic Acid (VITAMIN C) 1000 MG tablet Take 1,000 mg by mouth daily.    . Calcium 600-200 MG-UNIT tablet Take 1 tablet by mouth daily.    . Cholecalciferol (D3-50 PO) Take by mouth.    . Multiple Vitamin (MULTI-VITAMINS) TABS Take by mouth.    . Omega-3 Fatty Acids (RA FISH OIL) 1000 MG CAPS Take 3 capsules by mouth daily.     Marland Kitchen oxyCODONE-acetaminophen (PERCOCET/ROXICET) 5-325 MG tablet Take by mouth.    . traMADol (ULTRAM) 50 MG tablet Take 1 tablet (50 mg total) by mouth every 12 (twelve) hours as needed. 30 tablet 0   No current facility-administered medications on file prior to visit.     Review of Systems:  As per HPI- otherwise negative. No fever noted   Physical Examination: Vitals:   09/25/18 1414  BP: 130/80  Pulse: 100  Resp: 16  Temp: 97.6 F (36.4 C)  SpO2: 99%   Vitals:   09/25/18 1414  Weight: 145 lb (65.8 kg)  Height: 5' 1.5" (1.562 m)   Body mass index is 26.95 kg/m. Ideal Body Weight: Weight in (lb) to have BMI = 25: 134.2  GEN: WDWN, NAD, Non-toxic, A & O x 3, minimal overweight, looks well HEENT: Atraumatic, Normocephalic. Neck supple. No masses, No LAD. Bilateral TM wnl, oropharynx normal.  PEERL,EOMI.   Ears and Nose: No external deformity. CV: Irregular rate and rhythm noted today, No M/G/R. No JVD. No thrill. No extra heart sounds. PULM: CTA B, no wheezes, crackles, rhonchi. No retractions. No  resp. distress. No accessory muscle use. ABD: S, NT, ND, +BS. No rebound. No HSM.  Belly is benign to exam She is tender over the right lower ribs, but no bruises noted EXTR: No c/c/e NEURO using a wheelchair today due to pain in her ribs PSYCH: Normally interactive. Conversant. Not depressed or  anxious appearing.  Calm demeanor.   Dg Chest 2 View  Result Date: 09/25/2018 CLINICAL DATA:  Fall.  Right lower sided rib fractures. EXAM: CHEST - 2 VIEW COMPARISON:  None. FINDINGS: The heart, hila, and mediastinum are normal. No pneumothorax. Opacity in the right base may represent atelectasis or infiltrate. There is a small right pleural effusion. The reported rib fractures are not seen on this study. No other acute abnormalities. IMPRESSION: 1. The patient's reported right rib fractures are not identified on this study. 2. There is opacity in the right base which could represent infiltrate such as pneumonia versus atelectasis. Recommend clinical correlation. 3. Small right pleural effusion. 4. No pneumothorax. Electronically Signed   By: Dorise Bullion III M.D   On: 09/25/2018 15:30   Dg Abd 2 Views  Result Date: 09/25/2018 CLINICAL DATA:  Fall several days ago with abdominal distension. EXAM: ABDOMEN - 2 VIEW COMPARISON:  09/23/2018 FINDINGS: Scattered large and small bowel gas is noted. Retained fecal material is noted primarily within the right colon consistent with the given clinical history of constipation. No obstructive changes are seen. No free air is noted. Degenerative changes of lumbar spine are noted. No free air is seen. No mass lesion is noted. IMPRESSION: Changes consistent with mild constipation. Electronically Signed   By: Inez Catalina M.D.   On: 09/25/2018 15:31    Her EKG today also shows a fib with a rate in the lower 100s  She has never had a fib in the past  She does not have a cardiologist  Assessment and Plan: Atrial fibrillation, unspecified type ([place]) - Plan: metoprolol  tartrate (LOPRESSOR) 25 MG tablet, apixaban (ELIQUIS) 5 MG TABS tablet, TSH  Irregularly irregular pulse rhythm - Plan: EKG 12-Lead  Closed fracture of multiple ribs of right side, initial encounter - Plan: DG Chest 2 View, doxycycline (VIBRAMYCIN) 100 MG capsule  Drug-induced constipation - Plan: DG Abd 2 Views  Fairy is here today for a follow-up visit.  She had a fall over the weekend, and broke 2 ribs.  She was seen in the emergency room late Monday/early Tuesday, and diagnosed with a possible UTI.  A CT scan at that time showed broken ribs, but no bowel obstruction.  She apparently had a bowel obstruction many years ago, and required a partial bowel resection.  She is concerned as no bowel movement in the last several days.  Today [place] does not have any abdominal pain, and she has active bowel sounds.  Abdominal film shows constipation but no evidence of obstruction.  I have encouraged her to continue drinking fluids, and she may eat as she feels hungry.  She will use Colace and/or MiraLAX as needed to help herself with a bowel movement.  Constipation is likely caused by narcotic pain medication.  Chest films today did not demonstrate rib fractures, these were already seen on CT scan. She has some fluid in the right lung base which may be a developing pneumonia.  She is on Keflex already, but this will not cover chest infection.  We will add doxycycline to her regimen  Finally, Caelan is noted to be in atrial fibrillation today.  This is a new finding I called and discussed with cardiology Dr. of the day.  She agrees that [place] is in atrial fibrillation.  She suggested metoprolol, and anticoagulation.  She will also help arrange follow-up in the atrial fibrillation clinic Her eliquis dose is 5 BID as her weight is over 60kg and creat <  1.5 Meds ordered this encounter  Medications  . metoprolol tartrate (LOPRESSOR) 25 MG tablet    Sig: Take 1 tablet (25 mg total) by mouth 2 (two)  times daily.    Dispense:  60 tablet    Refill:  5  . doxycycline (VIBRAMYCIN) 100 MG capsule    Sig: Take 1 capsule (100 mg total) by mouth 2 (two) times daily.    Dispense:  20 capsule    Refill:  0  . apixaban (ELIQUIS) 5 MG TABS tablet    Sig: Take 1 tablet (5 mg total) by mouth 2 (two) times daily.    Dispense:  60 tablet    Refill:  [place] wonders what to do if she runs out of pain medication, I have advised her that she may contact me about this issue.  She has an appointment to see me next week for recheck  >60 minutes spent in face to face time with patient, >50% spent in counselling or coordination of care  Signed Lamar Blinks, MD  I was able to locate her urine culture result from the ER, as follows Urine culture ID >3 species present, probable contamination. No work up indicated.   As culture is negative, I will have my assistant call [facility] tomorrow.  She can stop taking the Keflex

## 2018-09-26 ENCOUNTER — Telehealth: Payer: Self-pay

## 2018-09-26 LAB — TSH: TSH: 2.66 u[IU]/mL (ref 0.35–4.50)

## 2018-09-26 NOTE — Telephone Encounter (Signed)
Pt has been scheduled in the afib clinic 09/29/2018 @ 2:00. Directions and parking code has been provided to the pt. Pt thanked me for the call.

## 2018-09-26 NOTE — Telephone Encounter (Signed)
Spoke with patient, Verbalized understanding.

## 2018-09-26 NOTE — Telephone Encounter (Signed)
-----   Message from Darreld Mclean, MD sent at 09/25/2018  9:33 PM EST ----- Hi Azarie Coriz,Can you please call Roby today?  She is on Keflex/cephalexin for a UTI, prescribed by the ER.  However her urine culture came back negative for infection.  Would you please let her know she can stop this antibiotic.  She can continue the doxycycline for her chest  Thank you JC

## 2018-09-26 NOTE — Telephone Encounter (Signed)
-----   Message from Sherran Needs, NP sent at 09/26/2018  8:36 AM EST ----- Please call and schedule appointment

## 2018-09-27 NOTE — Progress Notes (Signed)
[place] at [facility] [address] [place], [place] [address] [phone]  Date:  10/01/2018   Name:  Wendy Hebert   DOB:  August 06, 1933   MRN:  [id number]  PCP:  Darreld Mclean, MD    Chief Complaint: Hospitalization Follow-up (pneumonia, broken ribs) and Leg Pain (herniated disc/sciatica related? left leg, groin to knee)   History of Present Illness:  Wendy Hebert is a 85 y.o. very pleasant female patient who presents with the following:  Here today for hospital follow-up visit-I actually saw her already last week, we had her keep this visit for close recheck. Today is Monday, I saw her last Thursday.  At that time we followed up on 2 broken ribs that had occurred in a fall, with associated pneumonia or pleural effusion.  She was also noted to have new onset of atrial fibrillation with somewhat rapid ventricular response.  We start her on Eliquis and metoprolol, as well as doxycycline for potential pneumonia  She was seen in atrial fibrillation clinic this past Monday To increased her metoprolol dose to 1-1/2 tablets in the morning, 1 tab in the p.m. They continue Eliquis, and ordered an echocardiogram Plan to follow-up in 2 weeks, and have the echo the same day  She is able to go to the bathroom ok She was having a lot of issues with constipation at her last, but this seems to resolved  She is almost out of her pain medication which she is still using for her broken ribs as needed  She is currently using the hydrocodone she got from the emergency room She would like to have more pain medication for her rib pain, would like me to send a stop from Rx  Overall [place] feels like she is making progress.  Her rib pain is minimal, and less she is moving around quite a bit.  She feels that her breathing is no worse than last visit.  She does have a known small pleural effusion  09/23/2018  2   09/23/2018   Hydrocodone-Acetamin 5-325 MG  20.00 5 Carroll Sage   [id number]   Wal ([id number])   0  20.00 MME  Comm Ins   [place]  09/18/2018  2   09/18/2018  Oxycodone-Acetaminophen 5-325  20.00 5 Ni Coo   [id number]   [facility] ([id number])   0  30.00 MME  Comm Ins   [place]  07/23/2018  1   07/21/2018  Tramadol Hcl 50 MG Tablet  14.00 7 Je Cop   [id number]   Opt ([id number])   0  10.00 MME       Dg Chest 2 View  Result Date: 09/25/2018 CLINICAL DATA:  Fall.  Right lower sided rib fractures. EXAM: CHEST - 2 VIEW COMPARISON:  None. FINDINGS: The heart, hila, and mediastinum are normal. No pneumothorax. Opacity in the right base may represent atelectasis or infiltrate. There is a small right pleural effusion. The reported rib fractures are not seen on this study. No other acute abnormalities. IMPRESSION: 1. The patient's reported right rib fractures are not identified on this study. 2. There is opacity in the right base which could represent infiltrate such as pneumonia versus atelectasis. Recommend clinical correlation. 3. Small right pleural effusion. 4. No pneumothorax. Electronically Signed   By: Dorise Bullion III M.D   On: 09/25/2018 15:30   Dg Abd 2 Views  Result Date: 09/25/2018 CLINICAL DATA:  Fall several days ago  with abdominal distension. EXAM: ABDOMEN - 2 VIEW COMPARISON:  09/23/2018 FINDINGS: Scattered large and small bowel gas is noted. Retained fecal material is noted primarily within the right colon consistent with the given clinical history of constipation. No obstructive changes are seen. No free air is noted. Degenerative changes of lumbar spine are noted. No free air is seen. No mass lesion is noted. IMPRESSION: Changes consistent with mild constipation. Electronically Signed   By: Inez Catalina M.D.   On: 09/25/2018 15:31    Patient Active Problem List   Diagnosis Date Noted  . Atrial fibrillation ([place]) 10/01/2018  . Gait difficulty 03/20/2018  . Lichen sclerosus 16/06/9603  . History of right breast cancer 03/20/2018    Past  Medical History:  Diagnosis Date  . Arthritis   . Cancer ([place]) 2017   1.6 lumpectomy  . Lichen sclerosus     Past Surgical History:  Procedure Laterality Date  . BOWEL RESECTION    . BREAST LUMPECTOMY  2017  . CESAREAN SECTION    . HERNIA REPAIR    . TONSILLECTOMY      Social History   Tobacco Use  . Smoking status: Former Research scientist (life sciences)  . Smokeless tobacco: Never Used  . Tobacco comment: 30+ years  Substance Use Topics  . Alcohol use: Yes    Comment: occasionally  . Drug use: Never    History reviewed. No pertinent family history.  No Known Allergies  Medication list has been reviewed and updated.  Current Outpatient Medications on File Prior to Visit  Medication Sig Dispense Refill  . apixaban (ELIQUIS) 5 MG TABS tablet Take 1 tablet (5 mg total) by mouth 2 (two) times daily. 60 tablet 5  . Ascorbic Acid (VITAMIN C) 1000 MG tablet Take 1,000 mg by mouth daily.    . Calcium 600-200 MG-UNIT tablet Take 1 tablet by mouth daily.    . Cholecalciferol (D3-50 PO) Take by mouth.    . doxycycline (VIBRAMYCIN) 100 MG capsule Take 1 capsule (100 mg total) by mouth 2 (two) times daily. 20 capsule 0  . metoprolol tartrate (LOPRESSOR) 25 MG tablet Take 1.5tab in the AM and 1 tab in the PM 60 tablet 5  . Multiple Vitamin (MULTI-VITAMINS) TABS Take by mouth.    . Omega-3 Fatty Acids (RA FISH OIL) 1000 MG CAPS Take 3 capsules by mouth daily.     Marland Kitchen oxyCODONE-acetaminophen (PERCOCET/ROXICET) 5-325 MG tablet Take by mouth.     No current facility-administered medications on file prior to visit.     Review of Systems:  As per HPI- otherwise negative.   Physical Examination: Vitals:   10/01/18 1137  BP: 122/70  Pulse: 81  Resp: 16  Temp: 97.7 F (36.5 C)  SpO2: 96%   Vitals:   10/01/18 1137  Weight: 145 lb (65.8 kg)  Height: 5' 1.5" (1.562 m)   Body mass index is 26.95 kg/m. Ideal Body Weight: Weight in (lb) to have BMI = 25: 134.2  GEN: WDWN, NAD, Non-toxic, A & O x 3,  elderly lady in no acute distress HEENT: Atraumatic, Normocephalic. Neck supple. No masses, No LAD. Ears and Nose: No external deformity. CV: Still in A. fib but rate is now controlled, No M/G/R. No JVD. No thrill. No extra heart sounds. PULM: CTA B, no wheezes, crackles, rhonchi. No retractions. No resp. distress. No accessory muscle use. EXTR: No c/c/e NEURO sitting in wheelchair PSYCH: Normally interactive. Conversant. Not depressed or anxious appearing.  Calm demeanor.  Assessment and Plan: Atrial fibrillation, unspecified type ([place])  Closed fracture of multiple ribs of right side, initial encounter - Plan: HYDROcodone-acetaminophen (NORCO/VICODIN) 5-325 MG tablet, DG Ribs Unilateral Right, DG Chest 2 View Following up on atrial fibrillation, rib fractures today.  She is going to the A. fib clinic and is being treated appropriately.  She will follow-up with them in 2 weeks Her rib fractures are less symptomatic now.  Refilled her pain medication, she is using this judiciously.  She has no longer constipated.  Ordered repeat chest and rib films to be complete in about 1 month.  She does have known pleural effusion associate with her rib fractures.  At this time her breathing is stable, cautioned her that if this gets worse she should have a repeat chest x-ray right away   Signed Lamar Blinks, MD

## 2018-09-29 ENCOUNTER — Ambulatory Visit (HOSPITAL_COMMUNITY)
Admission: RE | Admit: 2018-09-29 | Discharge: 2018-09-29 | Disposition: A | Discharge status: Home / Self Care | Payer: Medicare Other | Source: Ambulatory Visit | Attending: Nurse Practitioner | Admitting: Nurse Practitioner

## 2018-09-29 DIAGNOSIS — I4891 Unspecified atrial fibrillation: Secondary | ICD-10-CM

## 2018-09-29 DIAGNOSIS — Z87891 Personal history of nicotine dependence: Secondary | ICD-10-CM | POA: Diagnosis not present

## 2018-09-29 DIAGNOSIS — Z79899 Other long term (current) drug therapy: Secondary | ICD-10-CM | POA: Insufficient documentation

## 2018-09-29 DIAGNOSIS — Z7901 Long term (current) use of anticoagulants: Secondary | ICD-10-CM | POA: Insufficient documentation

## 2018-09-29 DIAGNOSIS — M25569 Pain in unspecified knee: Secondary | ICD-10-CM | POA: Diagnosis not present

## 2018-09-29 DIAGNOSIS — W19XXXD Unspecified fall, subsequent encounter: Secondary | ICD-10-CM | POA: Insufficient documentation

## 2018-09-29 DIAGNOSIS — M1990 Unspecified osteoarthritis, unspecified site: Secondary | ICD-10-CM | POA: Insufficient documentation

## 2018-09-29 MED ORDER — METOPROLOL TARTRATE 25 MG PO TABS: ORAL_TABLET | ORAL | 5 refills | Status: DC

## 2018-09-29 MED ORDER — METOPROLOL TARTRATE 25 MG PO TABS: 37.5000 mg | ORAL_TABLET | Freq: Two times a day (BID) | ORAL | 5 refills | Status: DC

## 2018-09-29 NOTE — Patient Instructions (Addendum)
Increase metoprolol to 1.5 tab in the Am and 1 tab in the PM

## 2018-09-30 NOTE — Progress Notes (Signed)
Primary Care Physician: Darreld Mclean, MD Referring Physician: as above.    Wendy Hebert is a 85 y.o. female with a h/o recent falls x 2 that was seen at PCP 1/23 for f/u of same and was found to be in afib, asymptomatic She did suffer rib fx x 2 as result of the fall. Was seen and treated at [facility].  She was started on BB and DOAC by PCP on f/u and asked to f/u here. Today she remains in afib. She is not aware. Very sedentary for ambulation/back/ knee issues. She had increased knee pain intitally and she was given oxycodone and a cortisone shot in the knee. No significant lifestyle issues that may be contributing.  Today, she denies symptoms of palpitations, chest pain, shortness of breath, orthopnea, PND, lower extremity edema, dizziness, presyncope, syncope, or neurologic sequela.+ for being sedentary. The patient is tolerating medications without difficulties and is otherwise without complaint today.   Past Medical History:  Diagnosis Date  . Arthritis   . Cancer ([place]) 2017   1.6 lumpectomy  . Lichen sclerosus    Past Surgical History:  Procedure Laterality Date  . BOWEL RESECTION    . BREAST LUMPECTOMY  2017  . CESAREAN SECTION    . HERNIA REPAIR    . TONSILLECTOMY      Current Outpatient Medications  Medication Sig Dispense Refill  . apixaban (ELIQUIS) 5 MG TABS tablet Take 1 tablet (5 mg total) by mouth 2 (two) times daily. 60 tablet 5  . doxycycline (VIBRAMYCIN) 100 MG capsule Take 1 capsule (100 mg total) by mouth 2 (two) times daily. 20 capsule 0  . metoprolol tartrate (LOPRESSOR) 25 MG tablet Take 1.5tab in the AM and 1 tab in the PM 60 tablet 5  . Multiple Vitamin (MULTI-VITAMINS) TABS Take by mouth.    . oxyCODONE-acetaminophen (PERCOCET/ROXICET) 5-325 MG tablet Take by mouth.    . Ascorbic Acid (VITAMIN C) 1000 MG tablet Take 1,000 mg by mouth daily.    . Calcium 600-200 MG-UNIT tablet Take 1 tablet by mouth daily.    . Cholecalciferol (D3-50 PO) Take by  mouth.    . Omega-3 Fatty Acids (RA FISH OIL) 1000 MG CAPS Take 3 capsules by mouth daily.      No current facility-administered medications for this encounter.     No Known Allergies  Social History   Socioeconomic History  . Marital status: Married    Spouse name: joe  . Number of children: 6  . Years of education: Not on file  . Highest education level: Bachelor's degree (e.g., BA, AB, BS)  Occupational History  . 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 Financial resource strain: Not on file  . Food insecurity:    Worry: Not on file    Inability: Not on file  . Transportation needs:    Medical: Not on file    Non-medical: Not on file  Tobacco Use  . Smoking status: Former Research scientist (life sciences)  . Smokeless tobacco: Never Used  . Tobacco comment: 30+ years  Substance and Sexual Activity  . Alcohol use: Yes    Comment: occasionally  . Drug use: Never  . Sexual activity: Not on file  Lifestyle  . Physical activity:    Days per week: Not on file    Minutes per session: Not on file  . Stress: Not on file  Relationships  . Social connections:    Talks  on phone: Not on file    Gets together: Not on file    Attends religious service: Not on file    Active member of club or organization: Not on file    Attends meetings of clubs or organizations: Not on file    Relationship status: Not on file  . Intimate partner violence:    Fear of current or ex partner: Not on file    Emotionally abused: Not on file    Physically abused: Not on file    Forced sexual activity: Not on file  Other Topics Concern  . Not on file  Social History Narrative   Patient is right-handed.She lives with her husband in a one level house, several steps to enter. She drinks 4 cups of coffee and 4-5 glasses of tea a day.    No family history on file.  ROS- All systems are reviewed and negative except as per the HPI above  Physical Exam: Vitals:   09/29/18 1407  BP: 110/66    Pulse: (!) 113  Weight: 65.8 kg  Height: 5' 1.5" (1.562 m)   Wt Readings from Last 3 Encounters:  09/29/18 65.8 kg  09/25/18 65.8 kg  07/21/18 68 kg    Labs: Lab Results  Component Value Date   NA 141 07/03/2018   K 4.5 07/03/2018   CL 102 07/03/2018   CO2 23 07/03/2018   BUN 16 07/03/2018   CREATININE 1.1 07/03/2018   CALCIUM 9.3 07/03/2018   No results found for: INR Lab Results  Component Value Date   CHOL 188 07/03/2018   HDL 71 (A) 07/03/2018   LDLCALC 93 07/03/2018   TRIG 120 07/03/2018     GEN- The patient is well appearing, alert and oriented x 3 today.   Head- normocephalic, atraumatic Eyes-  Sclera clear, conjunctiva pink Ears- hearing intact Oropharynx- clear Neck- supple, no JVP Lymph- no cervical lymphadenopathy Lungs- Clear to ausculation bilaterally, normal work of breathing Heart- irregular rate and rhythm, no murmurs, rubs or gallops, PMI not laterally displaced GI- soft, NT, ND, + BS Extremities- no clubbing, cyanosis, or edema MS- no significant deformity or atrophy Skin- no rash or lesion Psych- euthymic mood, full affect Neuro- strength and sensation are intact  EKG-afib at 113 bpm Echo- pending   Assessment and Plan: 1. New onset afib, pt asymptomatic General education re afib Will increase Metoprolol to 1/1/2 tabs in the am and keep at 1 tab in the pm for better rate control  Echo ordered  2. CHA2DS2VASc score of 3 Even though pt had recent falls there is not a history pf same  Bleeding  precautions discussed  Continue eliquis 5 mg bid  Fall precautions discussed  F/u here in 2 weeks with echo ordered the same day  Butch Penny C. Jahzaria Vary, [facility] [address] [place], [place] [address] [phone]

## 2018-10-01 ENCOUNTER — Encounter: Payer: Self-pay | Admitting: Family Medicine

## 2018-10-01 ENCOUNTER — Ambulatory Visit (INDEPENDENT_AMBULATORY_CARE_PROVIDER_SITE_OTHER): Payer: Medicare Other | Admitting: Family Medicine

## 2018-10-01 VITALS — BP 122/70 | HR 81 | Temp 97.7°F | Resp 16 | Ht 61.5 in | Wt 145.0 lb

## 2018-10-01 DIAGNOSIS — S2241XA Multiple fractures of ribs, right side, initial encounter for closed fracture: Secondary | ICD-10-CM | POA: Diagnosis not present

## 2018-10-01 DIAGNOSIS — I4891 Unspecified atrial fibrillation: Secondary | ICD-10-CM | POA: Diagnosis not present

## 2018-10-01 MED ORDER — HYDROCODONE-ACETAMINOPHEN 5-325 MG PO TABS: 1.0000 | ORAL_TABLET | Freq: Four times a day (QID) | ORAL | 0 refills | Status: DC | PRN

## 2018-10-01 NOTE — Patient Instructions (Signed)
It was good to see you today, it seems that you are making normal progress with your rib fractures I sent a prescription for your pain medication to [facility] for you  I ordered repeat chest and rib films.  Please have these done in about 1 month.  You are welcome to see me for a visit, or you may also have these done at the imaging department here at your convenience without seeing me  As we discussed, you do have some fluid in your lungs.  This is likely from where you broke your ribs.  This will take some time to resolve, but if you feel that you are breathing is getting worse please let me know  I would recommend that you avoid NSAID medications such as ibuprofen or Aleve while you are on the blood thinner

## 2018-10-13 ENCOUNTER — Ambulatory Visit (HOSPITAL_COMMUNITY)
Admission: RE | Admit: 2018-10-13 | Discharge: 2018-10-13 | Disposition: A | Discharge status: Home / Self Care | Payer: Medicare Other | Source: Ambulatory Visit | Attending: Nurse Practitioner | Admitting: Nurse Practitioner

## 2018-10-13 ENCOUNTER — Encounter (HOSPITAL_COMMUNITY): Payer: Self-pay | Admitting: Nurse Practitioner

## 2018-10-13 ENCOUNTER — Other Ambulatory Visit: Payer: Self-pay

## 2018-10-13 ENCOUNTER — Ambulatory Visit (HOSPITAL_BASED_OUTPATIENT_CLINIC_OR_DEPARTMENT_OTHER)
Admission: RE | Admit: 2018-10-13 | Discharge: 2018-10-13 | Disposition: A | Discharge status: Home / Self Care | Payer: Medicare Other | Source: Ambulatory Visit | Attending: Nurse Practitioner | Admitting: Nurse Practitioner

## 2018-10-13 DIAGNOSIS — Z853 Personal history of malignant neoplasm of breast: Secondary | ICD-10-CM | POA: Insufficient documentation

## 2018-10-13 DIAGNOSIS — Z79899 Other long term (current) drug therapy: Secondary | ICD-10-CM | POA: Insufficient documentation

## 2018-10-13 DIAGNOSIS — I4891 Unspecified atrial fibrillation: Secondary | ICD-10-CM

## 2018-10-13 DIAGNOSIS — Z87891 Personal history of nicotine dependence: Secondary | ICD-10-CM | POA: Diagnosis not present

## 2018-10-13 DIAGNOSIS — Z7901 Long term (current) use of anticoagulants: Secondary | ICD-10-CM | POA: Insufficient documentation

## 2018-10-13 MED ORDER — APIXABAN 5 MG PO TABS: 5.0000 mg | ORAL_TABLET | Freq: Two times a day (BID) | ORAL | 2 refills | Status: DC

## 2018-10-13 MED ORDER — METOPROLOL TARTRATE 25 MG PO TABS: ORAL_TABLET | ORAL | 2 refills | Status: DC

## 2018-10-13 NOTE — Progress Notes (Addendum)
Primary Care Physician: Darreld Mclean, MD Referring Physician: as above.    Wendy Hebert is a 85 y.o. female with a h/o recent falls x 2 that was seen at PCP 1/23 for f/u of same and was found to be in afib, asymptomatic She did suffer rib fx x 2 as result of the fall. Was seen and treated at [facility] ER.  She was started on BB and DOAC by PCP on f/u and asked to f/u here. Today she remains in afib. She is not aware. Very sedentary for ambulation/back/ knee issues. She had increased knee pain intitally and she was given oxycodone and a cortisone shot in the knee. No significant lifestyle issues that may be contributing.  F/u in afib clinic, 10/13/18. She is now better rate controlled. She states that she is not aware of the afib. She is having issues with her left knee causing a lot of pain for the last 3 months and is more concerned about this than her afib. She had an echo prior to this appointment but results are not available at this time.   Today, she denies symptoms of palpitations, chest pain, shortness of breath, orthopnea, PND, lower extremity edema, dizziness, presyncope, syncope, or neurologic sequela.+ for being sedentary. The patient is tolerating medications without difficulties and is otherwise without complaint today.   Past Medical History:  Diagnosis Date  . Arthritis   . Cancer ([place]) 2017   1.6 lumpectomy  . Lichen sclerosus    Past Surgical History:  Procedure Laterality Date  . BOWEL RESECTION    . BREAST LUMPECTOMY  2017  . CESAREAN SECTION    . HERNIA REPAIR    . TONSILLECTOMY      Current Outpatient Medications  Medication Sig Dispense Refill  . apixaban (ELIQUIS) 5 MG TABS tablet Take 1 tablet (5 mg total) by mouth 2 (two) times daily. 60 tablet 5  . Cholecalciferol (D3-50 PO) Take by mouth.    . metoprolol tartrate (LOPRESSOR) 25 MG tablet Take 1.5tab in the AM and 1 tab in the PM 60 tablet 5  . Multiple Vitamins-Minerals (EYE VITAMINS & MINERALS PO)  Take by mouth.    . oxyCODONE-acetaminophen (PERCOCET/ROXICET) 5-325 MG tablet Take by mouth.     No current facility-administered medications for this encounter.     No Known Allergies  Social History   Socioeconomic History  . Marital status: Married    Spouse name: joe  . Number of children: 6  . Years of education: Not on file  . Highest education level: Bachelor's degree (e.g., BA, AB, BS)  Occupational History  . 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 Financial resource strain: Not on file  . Food insecurity:    Worry: Not on file    Inability: Not on file  . Transportation needs:    Medical: Not on file    Non-medical: Not on file  Tobacco Use  . Smoking status: Former Research scientist (life sciences)  . Smokeless tobacco: Never Used  . Tobacco comment: 30+ years  Substance and Sexual Activity  . Alcohol use: Yes    Comment: occasionally  . Drug use: Never  . Sexual activity: Not on file  Lifestyle  . Physical activity:    Days per week: Not on file    Minutes per session: Not on file  . Stress: Not on file  Relationships  . Social connections:    Talks on phone: Not on  file    Gets together: Not on file    Attends religious service: Not on file    Active member of club or organization: Not on file    Attends meetings of clubs or organizations: Not on file    Relationship status: Not on file  . Intimate partner violence:    Fear of current or ex partner: Not on file    Emotionally abused: Not on file    Physically abused: Not on file    Forced sexual activity: Not on file  Other Topics Concern  . Not on file  Social History Narrative   Patient is right-handed.She lives with her husband in a one level house, several steps to enter. She drinks 4 cups of coffee and 4-5 glasses of tea a day.    No family history on file.  ROS- All systems are reviewed and negative except as per the HPI above  Physical Exam: Vitals:   10/13/18 1448  Weight:  65.3 kg  Height: 5' 1.5" (1.562 m)   Wt Readings from Last 3 Encounters:  10/13/18 65.3 kg  10/01/18 65.8 kg  09/29/18 65.8 kg    Labs: Lab Results  Component Value Date   NA 141 07/03/2018   K 4.5 07/03/2018   CL 102 07/03/2018   CO2 23 07/03/2018   BUN 16 07/03/2018   CREATININE 1.1 07/03/2018   CALCIUM 9.3 07/03/2018   No results found for: INR Lab Results  Component Value Date   CHOL 188 07/03/2018   HDL 71 (A) 07/03/2018   LDLCALC 93 07/03/2018   TRIG 120 07/03/2018     GEN- The patient is well appearing, alert and oriented x 3 today.   Head- normocephalic, atraumatic Eyes-  Sclera clear, conjunctiva pink Ears- hearing intact Oropharynx- clear Neck- supple, no JVP Lymph- no cervical lymphadenopathy Lungs- Clear to ausculation bilaterally, normal work of breathing Heart- irregular rate and rhythm, no murmurs, rubs or gallops, PMI not laterally displaced GI- soft, NT, ND, + BS Extremities- no clubbing, cyanosis, or edema MS- no significant deformity or atrophy Skin- no rash or lesion Psych- euthymic mood, full affect Neuro- strength and sensation are intact  EKG-afib at 86 bpm Echo- pending   Assessment and Plan: 1. New onset afib, pt asymptomatic General education re afib Continue metoprolol to 1/1/2 tabs in the am and keep at 1 tab in the pm for better rate control  Echo results pending Discussed pursuing cardioversion, pt is unsure at this point if she wants to do this She would be eligible for DCCV after 3 weeks uninterrupted use, after 2/15, states she has not missed any doses of eliquis  She will think about it and when we call results of echo she will let us know if she does want to pursue.  2. CHA2DS2VASc score of 3 Even though pt had recent falls there is not a history pf same  Bleeding  precautions discussed  Continue eliquis 5 mg bid  Fall precautions discussed  3. Left knee pain She states that this is very painful for her  Plans to  discuss further with PCP  Will discuss her desire to pursue cardioversion later this week  Addendum: 11/10/2018- Pt is in today for labs as she called the office after the last appointment and did want to go forward with cardioversion. She states that she has not missed any of her anticoagulation since starting drug early February.  Geroge Baseman Carroll, [facility] [address] [place], [place] [address] [phone]

## 2018-10-13 NOTE — H&P (View-Only) (Signed)
Primary Care Physician: Darreld Mclean, MD Referring Physician: as above.    Wendy Hebert is a 85 y.o. female with a h/o recent falls x 2 that was seen at PCP 1/23 for f/u of same and was found to be in afib, asymptomatic She did suffer rib fx x 2 as result of the fall. Was seen and treated at [facility] ER.  She was started on BB and DOAC by PCP on f/u and asked to f/u here. Today she remains in afib. She is not aware. Very sedentary for ambulation/back/ knee issues. She had increased knee pain intitally and she was given oxycodone and a cortisone shot in the knee. No significant lifestyle issues that may be contributing.  F/u in afib clinic, 10/13/18. She is now better rate controlled. She states that she is not aware of the afib. She is having issues with her left knee causing a lot of pain for the last 3 months and is more concerned about this than her afib. She had an echo prior to this appointment but results are not available at this time.   Today, she denies symptoms of palpitations, chest pain, shortness of breath, orthopnea, PND, lower extremity edema, dizziness, presyncope, syncope, or neurologic sequela.+ for being sedentary. The patient is tolerating medications without difficulties and is otherwise without complaint today.   Past Medical History:  Diagnosis Date  . Arthritis   . Cancer ([place]) 2017   1.6 lumpectomy  . Lichen sclerosus    Past Surgical History:  Procedure Laterality Date  . BOWEL RESECTION    . BREAST LUMPECTOMY  2017  . CESAREAN SECTION    . HERNIA REPAIR    . TONSILLECTOMY      Current Outpatient Medications  Medication Sig Dispense Refill  . apixaban (ELIQUIS) 5 MG TABS tablet Take 1 tablet (5 mg total) by mouth 2 (two) times daily. 60 tablet 5  . Cholecalciferol (D3-50 PO) Take by mouth.    . metoprolol tartrate (LOPRESSOR) 25 MG tablet Take 1.5tab in the AM and 1 tab in the PM 60 tablet 5  . Multiple Vitamins-Minerals (EYE VITAMINS & MINERALS PO)  Take by mouth.    . oxyCODONE-acetaminophen (PERCOCET/ROXICET) 5-325 MG tablet Take by mouth.     No current facility-administered medications for this encounter.     No Known Allergies  Social History   Socioeconomic History  . Marital status: Married    Spouse name: joe  . Number of children: 6  . Years of education: Not on file  . Highest education level: Bachelor's degree (e.g., BA, AB, BS)  Occupational History  . 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 Financial resource strain: Not on file  . Food insecurity:    Worry: Not on file    Inability: Not on file  . Transportation needs:    Medical: Not on file    Non-medical: Not on file  Tobacco Use  . Smoking status: Former Research scientist (life sciences)  . Smokeless tobacco: Never Used  . Tobacco comment: 30+ years  Substance and Sexual Activity  . Alcohol use: Yes    Comment: occasionally  . Drug use: Never  . Sexual activity: Not on file  Lifestyle  . Physical activity:    Days per week: Not on file    Minutes per session: Not on file  . Stress: Not on file  Relationships  . Social connections:    Talks on phone: Not on  file    Gets together: Not on file    Attends religious service: Not on file    Active member of club or organization: Not on file    Attends meetings of clubs or organizations: Not on file    Relationship status: Not on file  . Intimate partner violence:    Fear of current or ex partner: Not on file    Emotionally abused: Not on file    Physically abused: Not on file    Forced sexual activity: Not on file  Other Topics Concern  . Not on file  Social History Narrative   Patient is right-handed.She lives with her husband in a one level house, several steps to enter. She drinks 4 cups of coffee and 4-5 glasses of tea a day.    No family history on file.  ROS- All systems are reviewed and negative except as per the HPI above  Physical Exam: Vitals:   10/13/18 1448  Weight:  65.3 kg  Height: 5' 1.5" (1.562 m)   Wt Readings from Last 3 Encounters:  10/13/18 65.3 kg  10/01/18 65.8 kg  09/29/18 65.8 kg    Labs: Lab Results  Component Value Date   NA 141 07/03/2018   K 4.5 07/03/2018   CL 102 07/03/2018   CO2 23 07/03/2018   BUN 16 07/03/2018   CREATININE 1.1 07/03/2018   CALCIUM 9.3 07/03/2018   No results found for: INR Lab Results  Component Value Date   CHOL 188 07/03/2018   HDL 71 (A) 07/03/2018   LDLCALC 93 07/03/2018   TRIG 120 07/03/2018     GEN- The patient is well appearing, alert and oriented x 3 today.   Head- normocephalic, atraumatic Eyes-  Sclera clear, conjunctiva pink Ears- hearing intact Oropharynx- clear Neck- supple, no JVP Lymph- no cervical lymphadenopathy Lungs- Clear to ausculation bilaterally, normal work of breathing Heart- irregular rate and rhythm, no murmurs, rubs or gallops, PMI not laterally displaced GI- soft, NT, ND, + BS Extremities- no clubbing, cyanosis, or edema MS- no significant deformity or atrophy Skin- no rash or lesion Psych- euthymic mood, full affect Neuro- strength and sensation are intact  EKG-afib at 86 bpm Echo- pending   Assessment and Plan: 1. New onset afib, pt asymptomatic General education re afib Continue metoprolol to 1/1/2 tabs in the am and keep at 1 tab in the pm for better rate control  Echo results pending Discussed pursuing cardioversion, pt is unsure at this point if she wants to do this She would be eligible for DCCV after 3 weeks uninterrupted use, after 2/15, states she has not missed any doses of eliquis  She will think about it and when we call results of echo she will let us know if she does want to pursue.  2. CHA2DS2VASc score of 3 Even though pt had recent falls there is not a history pf same  Bleeding  precautions discussed  Continue eliquis 5 mg bid  Fall precautions discussed  3. Left knee pain She states that this is very painful for her  Plans to  discuss further with PCP  Will discuss her desire to pursue cardioversion later this week  Addendum: 11/10/2018- Pt is in today for labs as she called the office after the last appointment and did want to go forward with cardioversion. She states that she has not missed any of her anticoagulation since starting drug early February.  Geroge Baseman Rayane Gallardo, [facility] [address]  [address] [place], [place] [address] [phone]

## 2018-10-13 NOTE — Progress Notes (Signed)
  Echocardiogram 2D Echocardiogram has been performed.  Wendy Hebert 10/13/2018, 2:50 PM

## 2018-10-16 ENCOUNTER — Telehealth (HOSPITAL_COMMUNITY): Payer: Self-pay | Admitting: *Deleted

## 2018-10-16 NOTE — Telephone Encounter (Signed)
Patient notified of echo results. States she would like to think about cardioversion (pt still unaware of afib) and discuss with PCP before making decision. She will call back with decision if she decides to proceed with cardioversion.

## 2018-10-17 ENCOUNTER — Emergency Department (HOSPITAL_BASED_OUTPATIENT_CLINIC_OR_DEPARTMENT_OTHER)
Admission: EM | Admit: 2018-10-17 | Discharge: 2018-10-18 | Disposition: A | Discharge status: Home / Self Care | Payer: Medicare Other | Attending: Emergency Medicine | Admitting: Emergency Medicine

## 2018-10-17 ENCOUNTER — Other Ambulatory Visit: Payer: Self-pay

## 2018-10-17 ENCOUNTER — Encounter (HOSPITAL_BASED_OUTPATIENT_CLINIC_OR_DEPARTMENT_OTHER): Payer: Self-pay | Admitting: Emergency Medicine

## 2018-10-17 DIAGNOSIS — Z87891 Personal history of nicotine dependence: Secondary | ICD-10-CM | POA: Diagnosis not present

## 2018-10-17 DIAGNOSIS — Z7901 Long term (current) use of anticoagulants: Secondary | ICD-10-CM | POA: Diagnosis not present

## 2018-10-17 DIAGNOSIS — R079 Chest pain, unspecified: Secondary | ICD-10-CM | POA: Insufficient documentation

## 2018-10-17 DIAGNOSIS — Z79899 Other long term (current) drug therapy: Secondary | ICD-10-CM | POA: Diagnosis not present

## 2018-10-17 DIAGNOSIS — Z853 Personal history of malignant neoplasm of breast: Secondary | ICD-10-CM | POA: Insufficient documentation

## 2018-10-17 DIAGNOSIS — R0789 Other chest pain: Secondary | ICD-10-CM | POA: Diagnosis not present

## 2018-10-17 NOTE — ED Triage Notes (Signed)
Pt states she is having some chest tightness and some shortness of breath  Pt states it started about 9pm tonight

## 2018-10-18 ENCOUNTER — Emergency Department (HOSPITAL_BASED_OUTPATIENT_CLINIC_OR_DEPARTMENT_OTHER): Payer: Medicare Other

## 2018-10-18 DIAGNOSIS — R0789 Other chest pain: Secondary | ICD-10-CM | POA: Diagnosis not present

## 2018-10-18 LAB — BASIC METABOLIC PANEL
Anion gap: 12 (ref 5–15)
BUN: 22 mg/dL (ref 8–23)
CALCIUM: 8.6 mg/dL — AB (ref 8.9–10.3)
CO2: 21 mmol/L — ABNORMAL LOW (ref 22–32)
CREATININE: 0.97 mg/dL (ref 0.44–1.00)
Chloride: 103 mmol/L (ref 98–111)
GFR calc Af Amer: >60 mL/min (ref >60)
GFR calc non Af Amer: 54 mL/min — ABNORMAL LOW (ref >60)
Glucose, Bld: 104 mg/dL — ABNORMAL HIGH (ref 70–99)
Potassium: 3.7 mmol/L (ref 3.5–5.1)
Sodium: 136 mmol/L (ref 135–145)

## 2018-10-18 LAB — CBC WITH DIFFERENTIAL/PLATELET
Abs Immature Granulocytes: 0.04 10*3/uL (ref 0.00–0.07)
Basophils Absolute: 0.1 10*3/uL (ref 0.0–0.1)
Basophils Relative: 1 %
EOS ABS: 0.4 10*3/uL (ref 0.0–0.5)
Eosinophils Relative: 3 %
HCT: 43.8 % (ref 36.0–46.0)
Hemoglobin: 14.1 g/dL (ref 12.0–15.0)
Immature Granulocytes: 0 %
Lymphocytes Relative: 25 %
Lymphs Abs: 2.7 10*3/uL (ref 0.7–4.0)
MCH: 30.8 pg (ref 26.0–34.0)
MCHC: 32.2 g/dL (ref 30.0–36.0)
MCV: 95.6 fL (ref 80.0–100.0)
Monocytes Absolute: 0.8 10*3/uL (ref 0.1–1.0)
Monocytes Relative: 8 %
NRBC: 0 % (ref 0.0–0.2)
Neutro Abs: 6.8 10*3/uL (ref 1.7–7.7)
Neutrophils Relative %: 63 %
PLATELETS: 308 10*3/uL (ref 150–400)
RBC: 4.58 MIL/uL (ref 3.87–5.11)
RDW: 13 % (ref 11.5–15.5)
WBC: 10.8 10*3/uL — ABNORMAL HIGH (ref 4.0–10.5)

## 2018-10-18 LAB — TROPONIN I
Troponin I: <0.03 ng/mL (ref <0.03)
Troponin I: <0.03 ng/mL (ref <0.03)

## 2018-10-18 MED ORDER — MORPHINE SULFATE (PF) 2 MG/ML IV SOLN: 2.0000 mg | Freq: Once | INTRAVENOUS | Status: DC

## 2018-10-18 NOTE — Discharge Instructions (Addendum)
Return to the emergency department if you develop worsening pain, difficulty breathing, high fever, or other new and concerning symptoms.  Follow-up with your cardiologist this week if symptoms or not improving.

## 2018-10-18 NOTE — ED Provider Notes (Signed)
[facility] Provider Note   CSN: [id number] Arrival date & time: 10/17/18  2325     History   Chief Complaint Chief Complaint  Patient presents with  . Chest Pain  . Shortness of Breath    HPI Wendy Hebert is a 85 y.o. female.  Patient is an 85 year old female with past medical history of breast cancer, atrial fibrillation which was recently diagnosed and was started on Eliquis.  She presents today for evaluation of chest discomfort.  This began at approximately 9:00 this evening while she was talking with a friend on the telephone.  She describes this as a tight band around her chest just below her breasts.  She denies any shortness of breath or nausea.  She denies any fevers, chills, or cough.  The history is provided by the patient.  Chest Pain  Pain location:  Substernal area Pain quality: tightness   Pain radiates to:  Does not radiate Pain severity:  Mild Onset quality:  Sudden Duration:  3 hours Timing:  Constant Progression:  Unchanged Chronicity:  New Relieved by:  Nothing Worsened by:  Nothing Ineffective treatments:  None tried Associated symptoms: shortness of breath   Shortness of Breath  Associated symptoms: chest pain     Past Medical History:  Diagnosis Date  . Arthritis   . Cancer ([place]) 2017   1.6 lumpectomy  . Lichen sclerosus     Patient Active Problem List   Diagnosis Date Noted  . Atrial fibrillation ([place]) 10/01/2018  . Gait difficulty 03/20/2018  . Lichen sclerosus 61/95/0932  . History of right breast cancer 03/20/2018    Past Surgical History:  Procedure Laterality Date  . BOWEL RESECTION    . BREAST LUMPECTOMY  2017  . CESAREAN SECTION    . HERNIA REPAIR    . TONSILLECTOMY       OB History   No obstetric history on file.      Home Medications    Prior to Admission medications   Medication Sig Start Date End Date Taking? Authorizing Provider  apixaban (ELIQUIS) 5 MG TABS tablet Take  1 tablet (5 mg total) by mouth 2 (two) times daily. 10/13/18   Sherran Needs, NP  Cholecalciferol (D3-50 PO) Take by mouth.    [provider]  metoprolol tartrate (LOPRESSOR) 25 MG tablet Take 1.5tab in the AM and 1 tab in the PM 10/13/18   Sherran Needs, NP  Multiple Vitamins-Minerals (EYE VITAMINS & MINERALS PO) Take by mouth.    [provider]  oxyCODONE-acetaminophen (PERCOCET/ROXICET) 5-325 MG tablet Take by mouth. 09/18/18   [provider]    Family History History reviewed. No pertinent family history.  Social History Social History   Tobacco Use  . Smoking status: Former Research scientist (life sciences)  . Smokeless tobacco: Never Used  . Tobacco comment: 30+ years  Substance Use Topics  . Alcohol use: Yes    Comment: occasionally  . Drug use: Never     Allergies   Patient has no known allergies.   Review of Systems Review of Systems  Respiratory: Positive for shortness of breath.   Cardiovascular: Positive for chest pain.  All other systems reviewed and are negative.    Physical Exam Updated Vital Signs BP (!) 149/90 (BP Location: Right Arm)   Pulse 93   Temp 97.9 F (36.6 C) (Oral)   Ht 5' 1.5" (1.562 m)   Wt 65.3 kg   SpO2 95%   BMI 26.77 kg/m  Physical Exam Vitals signs and nursing note reviewed.  Constitutional:      General: She is not in acute distress.    Appearance: She is well-developed. She is not diaphoretic.  HENT:     Head: Normocephalic and atraumatic.  Neck:     Musculoskeletal: Normal range of motion and neck supple.  Cardiovascular:     Rate and Rhythm: Normal rate and regular rhythm.     Heart sounds: No murmur. No friction rub. No gallop.   Pulmonary:     Effort: Pulmonary effort is normal. No respiratory distress.     Breath sounds: Normal breath sounds. No wheezing.  Abdominal:     General: Bowel sounds are normal. There is no distension.     Palpations: Abdomen is soft.     Tenderness: There is no abdominal  tenderness.  Musculoskeletal: Normal range of motion.     Right lower leg: She exhibits no tenderness. No edema.     Left lower leg: She exhibits no tenderness. No edema.  Skin:    General: Skin is warm and dry.  Neurological:     Mental Status: She is alert and oriented to person, place, and time.      ED Treatments / Results  Labs (all labs ordered are listed, but only abnormal results are displayed) Labs Reviewed  CBC WITH DIFFERENTIAL/PLATELET - Abnormal; Notable for the following components:      Result Value   WBC 10.8 (*)    All other components within normal limits  BASIC METABOLIC PANEL - Abnormal; Notable for the following components:   CO2 21 (*)    Glucose, Bld 104 (*)    Calcium 8.6 (*)    GFR calc non Af Amer 54 (*)    All other components within normal limits  TROPONIN I  TROPONIN I    EKG EKG Interpretation  Date/Time:  Friday October 17 2018 23:40:45 EST Ventricular Rate:  93 PR Interval:    QRS Duration: 84 QT Interval:  340 QTC Calculation: 423 R Axis:   49 Text Interpretation:  Atrial fibrillation Ventricular premature complex Low voltage, precordial leads Probable anteroseptal infarct, old Confirmed by Veryl Speak [phone]) on 10/18/2018 1:24:17 AM   Radiology Dg Chest 2 View  Result Date: 10/18/2018 CLINICAL DATA:  Chest tightness. EXAM: CHEST - 2 VIEW COMPARISON:  September 25, 2018 FINDINGS: The heart size and mediastinal contours are within normal limits. Both lungs are clear. The visualized skeletal structures are stable. IMPRESSION: No active cardiopulmonary disease. Electronically Signed   By: Abelardo Diesel M.D.   On: 10/18/2018 01:09    Procedures Procedures (including critical care time)  Medications Ordered in ED Medications - No data to display   Initial Impression / Assessment and Plan / ED Course  I have reviewed the triage vital signs and the nursing notes.  Pertinent labs & imaging results that were available during my care  of the patient were reviewed by me and considered in my medical decision making (see chart for details).  Patient presents with discomfort in her chest that she describes as a "band around her lower chest".  Her work-up is unremarkable including EKG and troponin x2.  She was recently diagnosed with atrial fibrillation and started on Eliquis.  I doubt PE.  Patient was kept in the ER for a repeat troponin which has also returned as negative.  Patient given the option of discharge versus admission and is adamant about being discharged.  She has also declined  pain medication while here in the ER.  She understands that her heart has not completely been ruled out as the problem and assures me she will return if her symptoms worsen.  She does have an appointment upcoming with her cardiologist regarding her A. Fib..  Final Clinical Impressions(s) / ED Diagnoses   Final diagnoses:  None    ED Discharge Orders    None       Veryl Speak, MD 10/18/18 [phone]

## 2018-10-21 ENCOUNTER — Ambulatory Visit: Payer: Self-pay

## 2018-10-21 ENCOUNTER — Encounter: Payer: Self-pay | Admitting: Family

## 2018-10-21 ENCOUNTER — Ambulatory Visit (INDEPENDENT_AMBULATORY_CARE_PROVIDER_SITE_OTHER): Payer: Medicare Other | Admitting: Family

## 2018-10-21 VITALS — BP 138/85 | HR 86 | Temp 98.5°F | Resp 16

## 2018-10-21 DIAGNOSIS — R197 Diarrhea, unspecified: Secondary | ICD-10-CM

## 2018-10-21 NOTE — Progress Notes (Signed)
Subjective:    Patient ID: Wendy Hebert, female    DOB: Jan 24, 1933, 85 y.o.   MRN: [id number]  HPI  Patient is an 85 yr old female who presents today with chief complaint of diarrhea.  Reports that diarrhea started on 10/17/18.  Reports that she went to the ED on 2/14 and had neg work up.  Having 3-4 loose stools a day.  No nausea/vomitting.  Appetite is somewhat down. Reports fair PO intake.  Reports that she did take abx in the end of January.   Review of Systems    see HPI  Past Medical History:  Diagnosis Date  . Arthritis   . Cancer ([place]) 2017   1.6 lumpectomy  . Lichen sclerosus      Social History   Socioeconomic History  . Marital status: Married    Spouse name: joe  . Number of children: 6  . Years of education: Not on file  . Highest education level: Bachelor's degree (e.g., BA, AB, BS)  Occupational History  . 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 Financial resource strain: Not on file  . Food insecurity:    Worry: Not on file    Inability: Not on file  . Transportation needs:    Medical: Not on file    Non-medical: Not on file  Tobacco Use  . Smoking status: Former Research scientist (life sciences)  . Smokeless tobacco: Never Used  . Tobacco comment: 30+ years  Substance and Sexual Activity  . Alcohol use: Yes    Comment: occasionally  . Drug use: Never  . Sexual activity: Not on file  Lifestyle  . Physical activity:    Days per week: Not on file    Minutes per session: Not on file  . Stress: Not on file  Relationships  . Social connections:    Talks on phone: Not on file    Gets together: Not on file    Attends religious service: Not on file    Active member of club or organization: Not on file    Attends meetings of clubs or organizations: Not on file    Relationship status: Not on file  . Intimate partner violence:    Fear of current or ex partner: Not on file    Emotionally abused: Not on file    Physically abused: Not on file     Forced sexual activity: Not on file  Other Topics Concern  . Not on file  Social History Narrative   Patient is right-handed.She lives with her husband in a one level house, several steps to enter. She drinks 4 cups of coffee and 4-5 glasses of tea a day.    Past Surgical History:  Procedure Laterality Date  . BOWEL RESECTION    . BREAST LUMPECTOMY  2017  . CESAREAN SECTION    . HERNIA REPAIR    . TONSILLECTOMY      No family history on file.  No Known Allergies  Current Outpatient Medications on File Prior to Visit  Medication Sig Dispense Refill  . apixaban (ELIQUIS) 5 MG TABS tablet Take 1 tablet (5 mg total) by mouth 2 (two) times daily. 180 tablet 2  . metoprolol tartrate (LOPRESSOR) 25 MG tablet Take 1.5tab in the AM and 1 tab in the PM 225 tablet 2   No current facility-administered medications on file prior to visit.     BP 138/85 (BP Location: Left Arm, Patient Position: Sitting, Cuff  Size: Small)   Pulse 86   Temp 98.5 F (36.9 C) (Oral)   Resp 16   SpO2 98%    Objective:   Physical Exam Constitutional:      Appearance: She is well-developed.  Neck:     Musculoskeletal: Neck supple.     Thyroid: No thyromegaly.  Cardiovascular:     Rate and Rhythm: Normal rate and regular rhythm.     Heart sounds: Normal heart sounds. No murmur.  Pulmonary:     Effort: Pulmonary effort is normal. No respiratory distress.     Breath sounds: Normal breath sounds. No wheezing.  Abdominal:     General: There is no distension.     Palpations: Abdomen is soft.     Tenderness: There is no abdominal tenderness.  Skin:    General: Skin is warm and dry.  Neurological:     Mental Status: She is alert and oriented to person, place, and time.  Psychiatric:        Behavior: Behavior normal.        Thought Content: Thought content normal.        Judgment: Judgment normal.           Assessment & Plan:  Diarrhea- most likely gastroenteritis, but need to rule out c  diff in the setting of recent abx.  Pt declined blood work.  She is agreeable to complete stool studies.  She is advised as follows:  Please return stool specimen at your earliest convenience. Go to the ER if you develop worsening diarrhea, fever, abdominal pain.   Call if symptoms are not improved in 3-4 days. Stay well hydrated.

## 2018-10-21 NOTE — Patient Instructions (Addendum)
Please return stool specimen at your earliest convenience. Go to the ER if you develop worsening diarrhea, fever, abdominal pain.   Call if symptoms are not improved in 3-4 days. Stay well hydrated.

## 2018-10-21 NOTE — Telephone Encounter (Signed)
Patient called in with c/o "diarrhea." She says "it started on Thursday and I read that this is a side effect from Metoprolol. I've been on that for a month or longer. I had three diarrhea stools today and about 4 on yesterday, real loose and sometimes watery loose. I don't have any pain, no blood in my stools. I don't drink enough, but I do drink. I probably don't urinate like I should, but I went about 1 hour ago." I asked about antibiotic use, she says "yes in the last month or so when I broke my ribs." She denies fever. According to protocol, see PCP within 24 hours. No availability with PCP. She says "I only want to see Dr. Charlett Blake." I advised she is not available until Monday and that is too far out for an appointment with having diarrhea as much as she's having it every day. She says "ok, I'll come whenever you tell me too." Appointment scheduled for today at [facility] with Debbrah Alar, FNP, care advice given, patient verbalized understanding.   Reason for Disposition . [1] MODERATE diarrhea (e.g., 4-6 times / day more than normal) AND [2] age > 70 years  Answer Assessment - Initial Assessment Questions 1. DIARRHEA SEVERITY: "How bad is the diarrhea?" "How many extra stools have you had in the past 24 hours than normal?"    - NO DIARRHEA (SCALE 0)   - MILD (SCALE 1-3): Few loose or mushy BMs; increase of 1-3 stools over normal daily number of stools; mild increase in ostomy output.   -  MODERATE (SCALE 4-7): Increase of 4-6 stools daily over normal; moderate increase in ostomy output. * SEVERE (SCALE 8-10; OR 'WORST POSSIBLE'): Increase of 7 or more stools daily over normal; moderate increase in ostomy output; incontinence.     Three times today and maybe 4 times yesterday 2. ONSET: "When did the diarrhea begin?"      Thursday 3. BM CONSISTENCY: "How loose or watery is the diarrhea?"      Loose 4. VOMITING: "Are you also vomiting?" If so, ask: "How many times in the past 24 hours?"       No 5. ABDOMINAL PAIN: "Are you having any abdominal pain?" If yes: "What does it feel like?" (e.g., crampy, dull, intermittent, constant)      No 6. ABDOMINAL PAIN SEVERITY: If present, ask: "How bad is the pain?"  (e.g., Scale 1-10; mild, moderate, or severe)   - MILD (1-3): doesn't interfere with normal activities, abdomen soft and not tender to touch    - MODERATE (4-7): interferes with normal activities or awakens from sleep, tender to touch    - SEVERE (8-10): excruciating pain, doubled over, unable to do any normal activities       No pain 7. ORAL INTAKE: If vomiting, "Have you been able to drink liquids?" "How much fluids have you had in the past 24 hours?"     N/A 8. HYDRATION: "Any signs of dehydration?" (e.g., dry mouth [not just dry lips], too weak to stand, dizziness, new weight loss) "When did you last urinate?"     No 9. EXPOSURE: "Have you traveled to a foreign country recently?" "Have you been exposed to anyone with diarrhea?" "Could you have eaten any food that was spoiled?"     No 10. ANTIBIOTIC USE: "Are you taking antibiotics now or have you taken antibiotics in the past 2 months?"      Yes in the past 2 months 11. OTHER SYMPTOMS: "Do you  have any other symptoms?" (e.g., fever, blood in stool)       No 12. PREGNANCY: "Is there any chance you are pregnant?" "When was your last menstrual period?"       No  Protocols used: DIARRHEA-A-AH

## 2018-10-22 ENCOUNTER — Other Ambulatory Visit: Payer: Medicare Other

## 2018-10-22 DIAGNOSIS — R197 Diarrhea, unspecified: Secondary | ICD-10-CM | POA: Diagnosis not present

## 2018-10-23 ENCOUNTER — Telehealth: Payer: Self-pay

## 2018-10-23 ENCOUNTER — Ambulatory Visit: Payer: Self-pay | Admitting: *Deleted

## 2018-10-23 NOTE — Telephone Encounter (Signed)
Copied from [place] [id number]. Topic: General - Other >> Oct 23, 2018  3:18 PM Windy Kalata wrote: Reason for CRM: Patient calling for lab results  Best call back [phone]

## 2018-10-23 NOTE — Telephone Encounter (Signed)
Summary: call back    Patient concerned that she has not received her lab results yet and she is still having August she has questions      Patient has been having diarrhea for a week now- she is still awaiting results. She is not taking anything to control her diarrhea.  Patient is concerned about her results and how to control her symptoms.  Patient has Pepto Bismol in the house and she can try that again. Per [facility] there is no interaction with her medications. Patient is going to try the Pepto to see if it helps her symptoms.  Please let her know any other advisement.    Reason for Disposition . [1] MODERATE diarrhea (e.g., 4-6 times / day more than normal) AND [2] age > 70 years    Patient is awaiting results of test-C diff. Patient is still having frequent diarrhea.  Answer Assessment - Initial Assessment Questions 1. DIARRHEA SEVERITY: "How bad is the diarrhea?" "How many extra stools have you had in the past 24 hours than normal?"    - NO DIARRHEA (SCALE 0)   - MILD (SCALE 1-3): Few loose or mushy BMs; increase of 1-3 stools over normal daily number of stools; mild increase in ostomy output.   -  MODERATE (SCALE 4-7): Increase of 4-6 stools daily over normal; moderate increase in ostomy output. * SEVERE (SCALE 8-10; OR 'WORST POSSIBLE'): Increase of 7 or more stools daily over normal; moderate increase in ostomy output; incontinence.     4 times in total/day 2. ONSET: "When did the diarrhea begin?"      1 week 3. BM CONSISTENCY: "How loose or watery is the diarrhea?"      watery 4. VOMITING: "Are you also vomiting?" If so, ask: "How many times in the past 24 hours?"      no 5. ABDOMINAL PAIN: "Are you having any abdominal pain?" If yes: "What does it feel like?" (e.g., crampy, dull, intermittent, constant)      No- pressure when patient has to go 6. ABDOMINAL PAIN SEVERITY: If present, ask: "How bad is the pain?"  (e.g., Scale 1-10; mild, moderate, or severe)   - MILD  (1-3): doesn't interfere with normal activities, abdomen soft and not tender to touch    - MODERATE (4-7): interferes with normal activities or awakens from sleep, tender to touch    - SEVERE (8-10): excruciating pain, doubled over, unable to do any normal activities       n/a 7. ORAL INTAKE: If vomiting, "Have you been able to drink liquids?" "How much fluids have you had in the past 24 hours?"     no 8. HYDRATION: "Any signs of dehydration?" (e.g., dry mouth [not just dry lips], too weak to stand, dizziness, new weight loss) "When did you last urinate?"     Lost a pound or 2 to less eating- patient is hydrating and drinking water 9. EXPOSURE: "Have you traveled to a foreign country recently?" "Have you been exposed to anyone with diarrhea?" "Could you have eaten any food that was spoiled?"     n/a 10. ANTIBIOTIC USE: "Are you taking antibiotics now or have you taken antibiotics in the past 2 months?"       Yes- within past 2 months- patient is awaiting results of C diff 11. OTHER SYMPTOMS: "Do you have any other symptoms?" (e.g., fever, blood in stool)       no 12. PREGNANCY: "Is there any chance you are pregnant?" "When  was your last menstrual period?"       n/a  Protocols used: DIARRHEA-A-AH

## 2018-10-23 NOTE — Telephone Encounter (Signed)
Called patient to advised her results are not ready yet.

## 2018-10-23 NOTE — Telephone Encounter (Signed)
Patient saw [name].

## 2018-10-24 LAB — CLOSTRIDIUM DIFFICILE BY PCR: Toxigenic C. Difficile by PCR: NEGATIVE

## 2018-10-24 NOTE — Telephone Encounter (Signed)
FYI Patient advised c diff was negative and culture is still pending. She reports she is still having diarrhea.

## 2018-10-24 NOTE — Telephone Encounter (Signed)
Pt calling to get results.  She states it's been over 48 hours and she needs to know what is going on. Pt can be reached at [phone]

## 2018-10-25 NOTE — Progress Notes (Signed)
[place] at [facility] [address], [place],  [address] [phone]  Date:  10/29/2018   Name:  Wendy Hebert   DOB:  [birth year]   MRN:  [id number]  PCP:  Darreld Mclean, MD    Chief Complaint: Follow-up (pt stated doing better and lay on the side.)   History of Present Illness:  Wendy Hebert is a 85 y.o. very pleasant female patient who presents with the following:  Here today to follow-up on broken ribs, atrial fib History of atrial fibrillation and lichen sclerosis, breast cancer  I last saw her in January 29, she had fallen and broken 2 ribs and had associated pneumonia or pleural effusion.  She was also noted to have newly diagnosed atrial fib with RVR  She has been seen in atrial fib clinic, is taking Eliquis twice daily and metoprolol for rate control.  They are considering cardioversion later-Devota is not entirely convinced she wants to do this, we talked about today.  I think she will try it  She was seen in the ER on the 14th, with complaint of chest pain; she ruled out and was released to home And saw Melissa on the 18th, with diarrhea She had been on antibiotics recently, so a C. difficile test was performed.  This was negative She still has diarrhea- she has tried to eat more potatoes; when she eats potatoes her stools are better Stool culture was also negative for any pathologic bacteria  Chest film from January 23 mentions a right base infiltrate, and small bilateral pleural effusions Recent repeat chest x-ray from February 15 is clear She also actually had rib and chest films done prior to our visit today, we had already planned this prior to her ER visit  She notes that her baseline SOB may be a bit worse still - she was SOB chronically (attributed to deconditioning and overweight) prior to her fall 6 weeks ago, it got worse with her rib fractures and pleural effusion.  Now improved, but thinks  she is not quite back to her baseline yet I discussed this with her in detail.  She has had a fairly thorough evaluation, but we have not ruled out a pulmonary embolism.  Offered to do a d-dimer or CT angiogram.  At this time she declines both of these  She is back on her multivitamin which I think is fine  She would like to see dermatology for a general skin exam, which is fine  Accompanied by her husband today who contributes to the history.  He also asked several questions, some of which are unrelated to his wife's care. Patient Active Problem List   Diagnosis Date Noted  . Atrial fibrillation ([place]) 10/01/2018  . Gait difficulty 03/20/2018  . Lichen sclerosus 34/28/7681  . History of right breast cancer 03/20/2018    Past Medical History:  Diagnosis Date  . Arthritis   . Cancer ([place]) 2017   1.6 lumpectomy  . Lichen sclerosus     Past Surgical History:  Procedure Laterality Date  . BOWEL RESECTION    . BREAST LUMPECTOMY  2017  . CESAREAN SECTION    . HERNIA REPAIR    . TONSILLECTOMY      Social History   Tobacco Use  . Smoking status: Former Research scientist (life sciences)  . Smokeless tobacco: Never Used  . Tobacco comment: 30+ years  Substance Use Topics  . Alcohol use: Yes  Comment: occasionally  . Drug use: Never    History reviewed. No pertinent family history.  No Known Allergies  Medication list has been reviewed and updated.  Current Outpatient Medications on File Prior to Visit  Medication Sig Dispense Refill  . apixaban (ELIQUIS) 5 MG TABS tablet Take 1 tablet (5 mg total) by mouth 2 (two) times daily. 180 tablet 2  . metoprolol tartrate (LOPRESSOR) 25 MG tablet Take 1.5tab in the AM and 1 tab in the PM 225 tablet 2   No current facility-administered medications on file prior to visit.     Review of Systems:  As per HPI- otherwise negative. No fever chills, no chest pain or shortness of breath  Physical Examination: Vitals:   10/29/18 1358  BP: 108/74  Pulse:  88  Resp: 14  SpO2: 95%   Vitals:   10/29/18 1358  Weight: 144 lb (65.3 kg)  Height: 5' 1.5" (1.562 m)   Body mass index is 26.77 kg/m. Ideal Body Weight: Weight in (lb) to have BMI = 25: 134.2  GEN: WDWN, NAD, Non-toxic, A & O x 3, mild overweight, looks well HEENT: Atraumatic, Normocephalic. Neck supple. No masses, No LAD. Ears and Nose: No external deformity. CV: RRR, No M/G/R. No JVD. No thrill. No extra heart sounds. PULM: CTA B, no wheezes, crackles, rhonchi. No retractions. No resp. distress. No accessory muscle use. ABD: S, NT, ND, +BS. No rebound. No HSM.  Belly is benign EXTR: No c/c/e NEURO sitting in wheelchair today, did not test gait PSYCH: Normally interactive. Conversant. Not depressed or anxious appearing.  Calm demeanor.   Went over her films from today.  Advised her that her lungs look clear and they did not actually appreciate a rib fractures today.  It is possible that the findings on her CT scan were false.  However, her symptoms and history are consistent with a rib fracture Dg Chest 2 View  Result Date: 10/29/2018 CLINICAL DATA:  Closed fracture of multiple ribs of the right side, initial encounter. EXAM: CHEST - 2 VIEW; RIGHT RIBS - 2 VIEW COMPARISON:  Chest x-ray 10/18/2018. CT of the abdomen and pelvis 09/23/2018. FINDINGS: Heart size is normal. Atherosclerotic changes are noted at the arch. Lungs are hyperinflated. There is no edema or effusion. No focal airspace disease is present. Dedicated views of the right ribs demonstrate no acute or healing fractures. There is no pneumothorax. CT findings may be artifactual. IMPRESSION: 1. No acute cardiopulmonary abnormality. 2. No acute or healing fracture. 3. Chronic changes of COPD. Electronically Signed   By: San Morelle M.D.   On: 10/29/2018 14:11   Dg Chest 2 View  Result Date: 10/18/2018 CLINICAL DATA:  Chest tightness. EXAM: CHEST - 2 VIEW COMPARISON:  September 25, 2018 FINDINGS: The heart size and  mediastinal contours are within normal limits. Both lungs are clear. The visualized skeletal structures are stable. IMPRESSION: No active cardiopulmonary disease. Electronically Signed   By: Abelardo Diesel M.D.   On: 10/18/2018 01:09   Dg Ribs Unilateral Right  Result Date: 10/29/2018 CLINICAL DATA:  Closed fracture of multiple ribs of the right side, initial encounter. EXAM: CHEST - 2 VIEW; RIGHT RIBS - 2 VIEW COMPARISON:  Chest x-ray 10/18/2018. CT of the abdomen and pelvis 09/23/2018. FINDINGS: Heart size is normal. Atherosclerotic changes are noted at the arch. Lungs are hyperinflated. There is no edema or effusion. No focal airspace disease is present. Dedicated views of the right ribs demonstrate no acute or healing fractures.  There is no pneumothorax. CT findings may be artifactual. IMPRESSION: 1. No acute cardiopulmonary abnormality. 2. No acute or healing fracture. 3. Chronic changes of COPD. Electronically Signed   By: San Morelle M.D.   On: 10/29/2018 14:11     Assessment and Plan: Closed fracture of multiple ribs of right side, initial encounter  Atrial fibrillation, unspecified type ([place])  Lichen sclerosus - Plan: Ambulatory referral to Dermatology  Following up on rib fractures today.  Her symptoms are better, her pain is improved.  She is able to move around more easily and lying in bed.  Still having some pain with movement, which is why she is in a wheelchair today She had repeat films which look good, as above. Discussed her atrial fibrillation and planned cardioversion. Diarrhea-may be related to recent antibiotic use, though her C. difficile is negative. I encouraged her to try a probiotic.  She might also explore with a fiber supplement She will let me know if this is not resolving  Discussed her shortness of breath in detail.  I have offered to do a d-dimer, and or a pulmonary CT.  At this time she declines, feels that her shortness of breath is really more  chronic.  She will let me know if she changes her mind  Asked her to see me in 4 months for a physical  Signed Lamar Blinks, MD

## 2018-10-26 LAB — STOOL CULTURE
MICRO NUMBER:: 215017
MICRO NUMBER:: 215018
MICRO NUMBER:: 215019
SHIGA RESULT:: NOT DETECTED
SPECIMEN QUALITY:: ADEQUATE
SPECIMEN QUALITY:: ADEQUATE
SPECIMEN QUALITY:: ADEQUATE

## 2018-10-26 NOTE — Telephone Encounter (Signed)
I contacted patient and she tells me that she is having about 4 loose stools a day.  Notes that stool was more firm this AM. She scheduled a follow up apt with Dr. Lorelei Pont for Wednesday. I advised her to keep this follow up appointment.

## 2018-10-27 ENCOUNTER — Telehealth: Payer: Self-pay | Admitting: *Deleted

## 2018-10-27 NOTE — Telephone Encounter (Signed)
Received Lab Report results from [facility]; forwarded to provider/SLS 02/24

## 2018-10-29 ENCOUNTER — Ambulatory Visit (HOSPITAL_BASED_OUTPATIENT_CLINIC_OR_DEPARTMENT_OTHER)
Admission: RE | Admit: 2018-10-29 | Discharge: 2018-10-29 | Disposition: A | Discharge status: Home / Self Care | Payer: Medicare Other | Source: Ambulatory Visit | Attending: Family Medicine | Admitting: Family Medicine

## 2018-10-29 ENCOUNTER — Ambulatory Visit (INDEPENDENT_AMBULATORY_CARE_PROVIDER_SITE_OTHER): Payer: Medicare Other | Admitting: Family Medicine

## 2018-10-29 ENCOUNTER — Encounter: Payer: Self-pay | Admitting: Family Medicine

## 2018-10-29 VITALS — BP 108/74 | HR 88 | Resp 14 | Ht 61.5 in | Wt 144.0 lb

## 2018-10-29 DIAGNOSIS — S2241XA Multiple fractures of ribs, right side, initial encounter for closed fracture: Secondary | ICD-10-CM

## 2018-10-29 DIAGNOSIS — I4891 Unspecified atrial fibrillation: Secondary | ICD-10-CM | POA: Diagnosis not present

## 2018-10-29 DIAGNOSIS — L900 Lichen sclerosus et atrophicus: Secondary | ICD-10-CM | POA: Diagnosis not present

## 2018-10-29 DIAGNOSIS — R197 Diarrhea, unspecified: Secondary | ICD-10-CM

## 2018-10-29 NOTE — Patient Instructions (Addendum)
You might try an OTC probiotic for diarrhea Also a fiber supplement might be helpful-worth trying  Your stool culture and C diff test were both negative  I think a cardioversion is worth a try- please discuss with your cardiology team   Let me know if you want me to do any further eval for your shortness of breath   Please see me in about 4 months for a physical exam/ full labs/ immunization review

## 2018-10-30 ENCOUNTER — Other Ambulatory Visit (HOSPITAL_COMMUNITY): Payer: Self-pay | Admitting: *Deleted

## 2018-11-10 ENCOUNTER — Other Ambulatory Visit: Payer: Self-pay

## 2018-11-10 ENCOUNTER — Encounter (HOSPITAL_COMMUNITY)
Admission: RE | Disposition: A | Discharge status: Home / Self Care | Payer: Self-pay | Source: Home / Self Care | Attending: Cardiovascular Disease

## 2018-11-10 ENCOUNTER — Encounter (HOSPITAL_COMMUNITY): Payer: Self-pay | Admitting: *Deleted

## 2018-11-10 ENCOUNTER — Ambulatory Visit (HOSPITAL_COMMUNITY)
Admission: RE | Admit: 2018-11-10 | Discharge: 2018-11-10 | Disposition: A | Discharge status: Home / Self Care | Payer: Medicare Other | Source: Ambulatory Visit | Attending: Nurse Practitioner | Admitting: Nurse Practitioner

## 2018-11-10 ENCOUNTER — Ambulatory Visit (HOSPITAL_COMMUNITY)
Admission: RE | Admit: 2018-11-10 | Discharge: 2018-11-10 | Disposition: A | Discharge status: Home / Self Care | Payer: Medicare Other | Attending: Cardiovascular Disease | Admitting: Cardiovascular Disease

## 2018-11-10 ENCOUNTER — Ambulatory Visit (HOSPITAL_COMMUNITY): Payer: Medicare Other | Admitting: Anesthesiology

## 2018-11-10 DIAGNOSIS — Z7901 Long term (current) use of anticoagulants: Secondary | ICD-10-CM | POA: Insufficient documentation

## 2018-11-10 DIAGNOSIS — Z87891 Personal history of nicotine dependence: Secondary | ICD-10-CM | POA: Diagnosis not present

## 2018-11-10 DIAGNOSIS — M25562 Pain in left knee: Secondary | ICD-10-CM | POA: Diagnosis not present

## 2018-11-10 DIAGNOSIS — I4891 Unspecified atrial fibrillation: Secondary | ICD-10-CM | POA: Insufficient documentation

## 2018-11-10 DIAGNOSIS — Z79899 Other long term (current) drug therapy: Secondary | ICD-10-CM | POA: Diagnosis not present

## 2018-11-10 DIAGNOSIS — I4819 Other persistent atrial fibrillation: Secondary | ICD-10-CM | POA: Diagnosis not present

## 2018-11-10 HISTORY — PX: CARDIOVERSION: SHX1299

## 2018-11-10 LAB — CBC
HCT: 45.2 % (ref 36.0–46.0)
Hemoglobin: 14.8 g/dL (ref 12.0–15.0)
MCH: 31.1 pg (ref 26.0–34.0)
MCHC: 32.7 g/dL (ref 30.0–36.0)
MCV: 95 fL (ref 80.0–100.0)
NRBC: 0 % (ref 0.0–0.2)
Platelets: 305 10*3/uL (ref 150–400)
RBC: 4.76 MIL/uL (ref 3.87–5.11)
RDW: 13.5 % (ref 11.5–15.5)
WBC: 8.4 10*3/uL (ref 4.0–10.5)

## 2018-11-10 LAB — BASIC METABOLIC PANEL
Anion gap: 9 (ref 5–15)
BUN: 12 mg/dL (ref 8–23)
CO2: 26 mmol/L (ref 22–32)
Calcium: 9 mg/dL (ref 8.9–10.3)
Chloride: 103 mmol/L (ref 98–111)
Creatinine, Ser: 1.05 mg/dL — ABNORMAL HIGH (ref 0.44–1.00)
GFR calc non Af Amer: 49 mL/min — ABNORMAL LOW (ref >60)
GFR, EST AFRICAN AMERICAN: 57 mL/min — AB (ref >60)
Glucose, Bld: 103 mg/dL — ABNORMAL HIGH (ref 70–99)
Potassium: 4.1 mmol/L (ref 3.5–5.1)
Sodium: 138 mmol/L (ref 135–145)

## 2018-11-10 SURGERY — CARDIOVERSION
Anesthesia: General

## 2018-11-10 MED ORDER — PROPOFOL 10 MG/ML IV BOLUS
INTRAVENOUS | Status: DC | PRN
  Administered 2018-11-10: 50 mg via INTRAVENOUS

## 2018-11-10 MED ORDER — SODIUM CHLORIDE 0.9 % IV SOLN
INTRAVENOUS | Status: DC | PRN
  Administered 2018-11-10: 11:00:00 via INTRAVENOUS

## 2018-11-10 MED ORDER — LIDOCAINE HCL (CARDIAC) PF 100 MG/5ML IV SOSY
PREFILLED_SYRINGE | INTRAVENOUS | Status: DC | PRN
  Administered 2018-11-10: 60 mg via INTRAVENOUS

## 2018-11-10 NOTE — Interval H&P Note (Signed)
History and Physical Interval Note:  11/10/2018 11:01 AM  Wendy Hebert  has presented today for surgery, with the diagnosis of afib.  The various methods of treatment have been discussed with the patient and family. After consideration of risks, benefits and other options for treatment, the patient has consented to  Procedure(s): CARDIOVERSION (N/A) as a surgical intervention.  The patient's history has been reviewed, patient examined, no change in status, stable for surgery.  I have reviewed the patient's chart and labs.  Questions were answered to the patient's satisfaction.     [name], MD

## 2018-11-10 NOTE — CV Procedure (Signed)
Electrical Cardioversion Procedure Note Wendy Hebert [id number] 1933-05-11  Procedure: Electrical Cardioversion Indications:  Atrial Fibrillation  Procedure Details Consent: Risks of procedure as well as the alternatives and risks of each were explained to the (patient/caregiver).  Consent for procedure obtained. Time Out: Verified patient identification, verified procedure, site/side was marked, verified correct patient position, special equipment/implants available, medications/allergies/relevent history reviewed, required imaging and test results available.  Performed  Patient placed on cardiac monitor, pulse oximetry, supplemental oxygen as necessary.  Sedation given: propofol Pacer pads placed anterior and posterior chest.  Cardioverted 1 time(s).  Cardioverted at 150J.  Evaluation Findings: Post procedure EKG shows: sinus rhythm with PACs Complications: None Patient did tolerate procedure well.   [name], MD 11/10/2018, 11:06 AM

## 2018-11-10 NOTE — Discharge Instructions (Signed)
Electrical Cardioversion, Care After °This sheet gives you information about how to care for yourself after your procedure. Your health care provider may also give you more specific instructions. If you have problems or questions, contact your health care provider. °What can I expect after the procedure? °After the procedure, it is common to have: °· Some redness on the skin where the shocks were given. °Follow these instructions at home: ° °· Do not drive for 24 hours if you were given a medicine to help you relax (sedative). °· Take over-the-counter and prescription medicines only as told by your health care provider. °· Ask your health care provider how to check your pulse. Check it often. °· Rest for 48 hours after the procedure or as told by your health care provider. °· Avoid or limit your caffeine use as told by your health care provider. °Contact a health care provider if: °· You feel like your heart is beating too quickly or your pulse is not regular. °· You have a serious muscle cramp that does not go away. °Get help right away if: ° °· You have discomfort in your chest. °· You are dizzy or you feel faint. °· You have trouble breathing or you are short of breath. °· Your speech is slurred. °· You have trouble moving an arm or leg on one side of your body. °· Your fingers or toes turn cold or blue. °This information is not intended to replace advice given to you by your health care provider. Make sure you discuss any questions you have with your health care provider. °Document Released: 06/10/2013 Document Revised: 03/23/2016 Document Reviewed: 02/24/2016 °Elsevier Interactive Patient Education © 2019 Else[employer] Inc. ° °

## 2018-11-10 NOTE — Anesthesia Postprocedure Evaluation (Signed)
Anesthesia Post Note  Patient: Wendy Hebert  Procedure(s) Performed: CARDIOVERSION (N/A )     Patient location during evaluation: PACU Anesthesia Type: General Level of consciousness: awake and alert Pain management: pain level controlled Vital Signs Assessment: post-procedure vital signs reviewed and stable Respiratory status: spontaneous breathing, nonlabored ventilation and respiratory function stable Cardiovascular status: blood pressure returned to baseline and stable Postop Assessment: no apparent nausea or vomiting Anesthetic complications: no    Last Vitals:  Vitals:   11/10/18 1130 11/10/18 1140  BP: (!) 99/50 110/73  Pulse: (!) 55 (!) 54  Resp: 16 11  Temp:    SpO2: 97% 98%    Last Pain:  Vitals:   11/10/18 1140  TempSrc:   PainSc: 0-No pain                 Audry Pili

## 2018-11-10 NOTE — Anesthesia Preprocedure Evaluation (Addendum)
Anesthesia Evaluation  Patient identified by MRN, date of birth, ID band Patient awake    Reviewed: Allergy & Precautions, NPO status , Patient's Chart, lab work & pertinent test results, reviewed documented beta blocker date and time   History of Anesthesia Complications Negative for: history of anesthetic complications  Airway Mallampati: II  TM Distance: >3 FB Neck ROM: Full    Dental  (+) Dental Advisory Given, Teeth Intact   Pulmonary former smoker,    breath sounds clear to auscultation       Cardiovascular + dysrhythmias Atrial Fibrillation  Rhythm:Irregular Rate:Tachycardia   '20 TTE - EF 60-65%. Left atrial size was mildly dilated.    Neuro/Psych negative neurological ROS  negative psych ROS   GI/Hepatic negative GI ROS, Neg liver ROS,   Endo/Other  negative endocrine ROS  Renal/GU negative Renal ROS     Musculoskeletal  (+) Arthritis ,   Abdominal   Peds  Hematology negative hematology ROS (+)   Anesthesia Other Findings   Reproductive/Obstetrics  Lumpectomy                            Anesthesia Physical Anesthesia Plan  ASA: III  Anesthesia Plan: General   Post-op Pain Management:    Induction: Intravenous  PONV Risk Score and Plan: 3 and Treatment may vary due to age or medical condition and Propofol infusion  Airway Management Planned: Mask and Natural Airway  Additional Equipment: None  Intra-op Plan:   Post-operative Plan:   Informed Consent: I have reviewed the patients History and Physical, chart, labs and discussed the procedure including the risks, benefits and alternatives for the proposed anesthesia with the patient or authorized representative who has indicated his/her understanding and acceptance.       Plan Discussed with: CRNA and Anesthesiologist  Anesthesia Plan Comments:        Anesthesia Quick Evaluation

## 2018-11-10 NOTE — Transfer of Care (Signed)
Immediate Anesthesia Transfer of Care Note  Patient: Wendy Hebert  Procedure(s) Performed: CARDIOVERSION (N/A )  Patient Location: Endoscopy Unit  Anesthesia Type:General  Level of Consciousness: drowsy and patient cooperative  Airway & Oxygen Therapy: Patient Spontanous Breathing and Patient connected to nasal cannula oxygen  Post-op Assessment: Report given to RN and Post -op Vital signs reviewed and stable  Post vital signs: Reviewed and stable  Last Vitals:  Vitals Value Taken Time  BP    Temp    Pulse    Resp    SpO2      Last Pain:  Vitals:   11/10/18 0950  TempSrc: Oral  PainSc:          Complications: No apparent anesthesia complications

## 2018-11-10 NOTE — Addendum Note (Signed)
Encounter addended by: Sherran Needs, NP on: 11/10/2018 9:14 AM  Actions taken: Clinical Note Signed

## 2018-11-12 ENCOUNTER — Encounter (HOSPITAL_COMMUNITY): Payer: Self-pay | Admitting: Cardiovascular Disease

## 2018-11-25 ENCOUNTER — Encounter (HOSPITAL_COMMUNITY): Payer: Self-pay

## 2018-11-25 ENCOUNTER — Ambulatory Visit (HOSPITAL_COMMUNITY)
Admission: RE | Admit: 2018-11-25 | Discharge: 2018-11-25 | Disposition: A | Discharge status: Home / Self Care | Payer: Medicare Other | Source: Ambulatory Visit | Attending: Nurse Practitioner | Admitting: Nurse Practitioner

## 2018-11-25 ENCOUNTER — Other Ambulatory Visit: Payer: Self-pay

## 2018-11-25 DIAGNOSIS — I4891 Unspecified atrial fibrillation: Secondary | ICD-10-CM | POA: Diagnosis not present

## 2018-11-25 NOTE — Progress Notes (Signed)
Electrophysiology TeleHealth Note   Due to national recommendations of social distancing due to [place] 19, Audio telehealth visit is felt to be most appropriate for this patient at this time.  See MyChart message/consent below  from today for patient consent regarding telehealth for the Atrial Fibrillation Clinic.    Date:  11/25/2018   ID:  Wendy Hebert, DOB 1933-08-26, MRN [id number]  Location: home  Provider location: [address] [place], [place] [address] Evaluation Performed: Follow up cardioversion  PCP:  Cop[place], Gay Filler, MD  Primary Cardiologist:   None  Primary Electrophysiologist: None  CC: afib   History of Present Illness: Wendy Hebert is a 85 y.o. female who presents via video conferencing for a telehealth visit today. The patient is being evaluated psot cardioversion.   Today, she denies symptoms of palpitations, chest pain, shortness of breath, orthopnea, PND, lower extremity edema, claudication, dizziness, presyncope, syncope, bleeding, or neurologic sequela. The patient is tolerating medications without difficulties and is otherwise without complaint today.   she denies symptoms of cough, fevers, chills, or new SOB worrisome for COVID 19.     Atrial Fibrillation Risk Factors:  she does not have symptoms or diagnosis of sleep apnea.  she has a BMI of There is no height or weight on file to calculate BMI.. There were no vitals filed for this visit.  Past Medical History:  Diagnosis Date  . Arthritis   . Cancer ([place]) 2017   1.6 lumpectomy  . Lichen sclerosus    Past Surgical History:  Procedure Laterality Date  . BOWEL RESECTION    . BREAST LUMPECTOMY  2017  . CARDIOVERSION N/A 11/10/2018   Procedure: CARDIOVERSION;  Surgeon: [name], MD;  Location: [place];  Service: Cardiovascular;  Laterality: N/A;  . CESAREAN SECTION    . HERNIA REPAIR    . TONSILLECTOMY       Current Outpatient Medications  Medication Sig Dispense  Refill  . ALPRAZolam (XANAX) 0.5 MG tablet Take 0.25 mg by mouth at bedtime as needed for sleep.    Marland Kitchen apixaban (ELIQUIS) 5 MG TABS tablet Take 1 tablet (5 mg total) by mouth 2 (two) times daily. 180 tablet 2  . Ascorbic Acid (VITAMIN C) 1000 MG tablet Take 1,000 mg by mouth every evening.    . Calcium Carb-Cholecalciferol (CALCIUM 600 + D PO) Take 1 tablet by mouth every evening.    . clobetasol cream (TEMOVATE) 6.81 % Apply 1 application topically daily.    . metoprolol tartrate (LOPRESSOR) 25 MG tablet Take 1.5tab in the AM and 1 tab in the PM (Patient taking differently: Take 25-37.5 mg by mouth See admin instructions. Take 37.5 mg in the morning and 25 mg in the evening) 225 tablet 2  . Multiple Vitamin (MULTIVITAMIN WITH MINERALS) TABS tablet Take 1 tablet by mouth every evening.    . Multiple Vitamins-Minerals (PRESERVISION AREDS 2) CAPS Take 2 capsules by mouth every evening.    . Probiotic CAPS Take 1 capsule by mouth every evening.     No current facility-administered medications for this encounter.     Allergies:   Patient has no known allergies.   Social History:  The patient  reports that she has quit smoking. She has never used smokeless tobacco. She reports current alcohol use. She reports that she does not use drugs.   Family History:  The patient's  family history is not on file.    ROS:  Please see the history  of present illness.   All other systems are personally reviewed and negative.   Exam: N/A  Recent Labs: 07/03/2018: ALT 20 09/25/2018: TSH 2.66 11/10/2018: BUN 12; Creatinine, Ser 1.05; Hemoglobin 14.8; Platelets 305; Potassium 4.1; Sodium 138  personally reviewed    Other studies personally reviewed: Additional studies/ records that were reviewed today include: Cardioversion report   The patient presents wearable device technology report for my review today.     ASSESSMENT AND PLAN:  1.  Persistent atrial fibrillation Successful cardioversion Pt  appears to be continuing in SR HR reported to be 76 bpm and regular Continue BB use without change  2. CHA2DS2VASc score is at least 3 This patients CHA2DS2-VASc Score and unadjusted Ischemic Stroke Rate (% per year) is equal to 3.2 % stroke rate/year from a score of 3   Above score calculated as 1 point each if present [CHF, HTN, DM, Vascular=MI/PAD/Aortic Plaque, Age if 65-74, or Female] Above score calculated as 2 points each if present [Age > 75, or Stroke/TIA/TE]  Reminded not to miss any anticoagualtion   COVID screen The patient does not have any symptoms that suggest any further testing/ screening at this time.    Follow-up: with PCP as scheduled afib clinic as needed  Current medicines are reviewed at length with the patient today.   The patient does not have concerns regarding her medicines.  The following changes were made today:  none  Labs/ tests ordered today include: none No orders of the defined types were placed in this encounter.   Patient Risk:  after full review of this patients clinical status, I feel that they are at moderate   risk at this time.   Today, I have spent 5-10 minutes with the patient with telehealth technology discussing afib.    Gwenlyn Perking PA-C 11/25/2018 2:17 PM  Afib [facility] [address] [place], [place] [address] [phone]   I hereby voluntarily request, consent and authorize the [facility] and its employed or contracted physicians, physician assistants, nurse practitioners or other licensed health care professionals (the Practitioner), to provide me with telemedicine health care services (the "Services") as deemed necessary by the treating Practitioner. I acknowledge and consent to receive the Services by the Practitioner via telemedicine. I understand that the telemedicine visit will involve communicating with the Practitioner through live audiovisual communication technology and the  disclosure of certain medical information by electronic transmission. I acknowledge that I have been given the opportunity to request an in-person assessment or other available alternative prior to the telemedicine visit and am voluntarily participating in the telemedicine visit.   I understand that I have the right to withhold or withdraw my consent to the use of telemedicine in the course of my care at any time, without affecting my right to future care or treatment, and that the Practitioner or I may terminate the telemedicine visit at any time. I understand that I have the right to inspect all information obtained and/or recorded in the course of the telemedicine visit and may receive copies of available information for a reasonable fee.  I understand that some of the potential risks of receiving the Services via telemedicine include:   Delay or interruption in medical evaluation due to technological equipment failure or disruption;  Information transmitted may not be sufficient (e.g. poor resolution of images) to allow for appropriate medical decision making by the Practitioner; and/or  In rare instances, security protocols could fail, causing a breach of  personal health information.   Furthermore, I acknowledge that it is my responsibility to provide information about my medical history, conditions and care that is complete and accurate to the best of my ability. I acknowledge that Practitioner's advice, recommendations, and/or decision may be based on factors not within their control, such as incomplete or inaccurate data provided by me or distortions of diagnostic images or specimens that may result from electronic transmissions. I understand that the practice of medicine is not an exact science and that Practitioner makes no warranties or guarantees regarding treatment outcomes. I acknowledge that I will receive a copy of this consent concurrently upon execution via email to the email address I last  provided but may also request a printed copy by calling the office of the [facility].  I understand that my insurance will be billed for this visit.   I have read or had this consent read to me.  I understand the contents of this consent, which adequately explains the benefits and risks of the Services being provided via telemedicine.  I have been provided ample opportunity to ask questions regarding this consent and the Services and have had my questions answered to my satisfaction.  I give my informed consent for the services to be provided through the use of telemedicine in my medical care  By participating in this telemedicine visit I agree to the above.

## 2018-12-03 ENCOUNTER — Encounter: Payer: Self-pay | Admitting: Family Medicine

## 2018-12-03 ENCOUNTER — Other Ambulatory Visit: Payer: Self-pay

## 2018-12-03 ENCOUNTER — Ambulatory Visit (INDEPENDENT_AMBULATORY_CARE_PROVIDER_SITE_OTHER): Payer: Medicare Other | Admitting: Family Medicine

## 2018-12-03 DIAGNOSIS — M10072 Idiopathic gout, left ankle and foot: Secondary | ICD-10-CM | POA: Diagnosis not present

## 2018-12-03 MED ORDER — HYDROCODONE-ACETAMINOPHEN 5-325 MG PO TABS: 1.0000 | ORAL_TABLET | Freq: Four times a day (QID) | ORAL | 0 refills | Status: DC | PRN

## 2018-12-03 MED ORDER — COLCHICINE 0.6 MG PO TABS: ORAL_TABLET | ORAL | 0 refills | Status: DC

## 2018-12-03 NOTE — Patient Instructions (Addendum)
It was good to talk with you over video today.  As we discussed, I do think you likely have gout.  I sent in a prescription for colchicine to your local pharmacy, and a refill of the pain medication to your mail away pharmacy.  The pain medication can cause drowsiness, do not combine with any other sedating substances or alprazolam.  Use the colchicine as directed for gout.  This should improve your situation fairly quickly, please let me know if you are not feeling better within a day or so. As we discussed, colchicine may cause loose stools. Please let me know if any other questions or concerns, or if you are not seeing improvement

## 2018-12-03 NOTE — Progress Notes (Signed)
Wendy Hebert [address] [place], [place] [address] [phone]  Date:  12/03/2018   Name:  Wendy Hebert   DOB:  November 11, 1932   MRN:  [id number]  PCP:  Darreld Mclean, MD    Chief Complaint: No chief complaint on file.   History of Present Illness:  Wendy Hebert is a 85 y.o. very pleasant female patient who presents with the following:  Pt ID confirmed with name and DOB Her husband suspects that she has gout-he has experienced with gout, and thinks that her symptoms are consistent.  Her left foot started being swollen and painful about 5 days ago  She has not had any injury or other known cause She has had more minor sx like this in the past and was suspected to have gout, but her symptoms had never been this severe It is hard to walk No fever noted  She has some hydrocodone left which she has used in the past for broken ribs, it did help.  She wonders if she could have some more to use as needed for pain relief We generally avoid NSAIDs due to her use of Eliquis for A. fib   Patient Active Problem List   Diagnosis Date Noted  . Atrial fibrillation ([facility]) 10/01/2018  . Gait difficulty 03/20/2018  . Lichen sclerosus 29/52/8413  . History of right breast cancer 03/20/2018    Past Medical History:  Diagnosis Date  . Arthritis   . Cancer ([facility]) 2017   1.6 lumpectomy  . Lichen sclerosus     Past Surgical History:  Procedure Laterality Date  . BOWEL RESECTION    . BREAST LUMPECTOMY  2017  . CARDIOVERSION N/A 11/10/2018   Procedure: CARDIOVERSION;  Surgeon: [name], MD;  Location: [facility];  Service: Cardiovascular;  Laterality: N/A;  . CESAREAN SECTION    . HERNIA REPAIR    . TONSILLECTOMY      Social History   Tobacco Use  . Smoking status: Former Research scientist (life sciences)  . Smokeless tobacco: Never Used  . Tobacco comment: 30+ years  Substance Use Topics  . Alcohol use: Yes    Comment:  occasionally  . Drug use: Never    No family history on file.  No Known Allergies  Medication list has been reviewed and updated.  Current Outpatient Medications on File Prior to Visit  Medication Sig Dispense Refill  . ALPRAZolam (XANAX) 0.5 MG tablet Take 0.25 mg by mouth at bedtime as needed for sleep.    Marland Kitchen apixaban (ELIQUIS) 5 MG TABS tablet Take 1 tablet (5 mg total) by mouth 2 (two) times daily. 180 tablet 2  . Ascorbic Acid (VITAMIN C) 1000 MG tablet Take 1,000 mg by mouth every evening.    . Calcium Carb-Cholecalciferol (CALCIUM 600 + D PO) Take 1 tablet by mouth every evening.    . clobetasol cream (TEMOVATE) 2.44 % Apply 1 application topically daily.    . metoprolol tartrate (LOPRESSOR) 25 MG tablet Take 1.5tab in the AM and 1 tab in the PM (Patient taking differently: Take 25-37.5 mg by mouth See admin instructions. Take 37.5 mg in the morning and 25 mg in the evening) 225 tablet 2  . Multiple Vitamin (MULTIVITAMIN WITH MINERALS) TABS tablet Take 1 tablet by mouth every evening.    . Multiple Vitamins-Minerals (PRESERVISION AREDS 2) CAPS Take 2 capsules by mouth every evening.    . Probiotic CAPS Take 1 capsule  by mouth every evening.     No current facility-administered medications on file prior to visit.     Review of Systems:  As per HPI No unusual SOB  Physical Examination: There were no vitals filed for this visit. There were no vitals filed for this visit. There is no height or weight on file to calculate BMI. Ideal Body Weight:    Wt Readings from Last 3 Encounters:  10/29/18 144 lb (65.3 kg)  10/17/18 144 lb (65.3 kg)  10/13/18 144 lb (65.3 kg)    Her BP was 117/79, pulse was 72 I was able to look at her foot on camera today -I do appreciate redness and swelling at the first MCP.  With the limitations of video visit, I do think she likely has gout. Assessment and Plan: Acute idiopathic gout involving toe of left foot - Plan: colchicine 0.6 MG tablet,  HYDROcodone-acetaminophen (NORCO/VICODIN) 5-325 MG tablet   Went visit today for likely gout in the left foot.  We will plan to use colchicine, have advised patient this may cause some loose stools.  She is currently using probiotics for this issue, which has been helpful.  We will also refill her hydrocodone to use as needed for pain Estimated creat clearance 40 [facility] to [facility], col[facility]icine to [facility]   Symptoms summary today's visit to her MyChart account  Meds ordered this encounter  Medications  . colchicine 0.6 MG tablet    Sig: Take 2 pills once then take one pill an hour later for acute gout.  May take 1 pill daily for 2-3 days more if needed    Dispense:  30 tablet    Refill:  0  . HYDROcodone-acetaminophen (NORCO/VICODIN) 5-325 MG tablet    Sig: Take 1 tablet by mouth every 6 (six) hours as needed for up to 5 days.    Dispense:  20 tablet    Refill:  0    Signed Lamar Blinks, MD

## 2018-12-15 ENCOUNTER — Telehealth: Payer: Self-pay | Admitting: Family Medicine

## 2018-12-15 NOTE — Telephone Encounter (Signed)
Gave her a call back She does not think this is gout like it was about 2 weeks ago She has "had foot trouble for 20 years" She does take naprosyn OTC - I advised her that this is an NSAID and can increase her bleeding risk in combination with eliquis  I suggested that she try tylenol and she will do so

## 2018-12-15 NOTE — Telephone Encounter (Signed)
Patient called and said that she is currently having foot pain.  Patient has been told in the past not to take NSAIDs.  She wants to know can she take naproxen for the pain before it gets worse.  Patient is requesting a phone call to discuss.    [phone]

## 2019-01-12 ENCOUNTER — Other Ambulatory Visit: Payer: Self-pay

## 2019-01-12 ENCOUNTER — Ambulatory Visit: Payer: Self-pay

## 2019-01-12 ENCOUNTER — Ambulatory Visit (INDEPENDENT_AMBULATORY_CARE_PROVIDER_SITE_OTHER): Payer: Medicare Other | Admitting: Family Medicine

## 2019-01-12 ENCOUNTER — Encounter: Payer: Self-pay | Admitting: Family Medicine

## 2019-01-12 DIAGNOSIS — R21 Rash and other nonspecific skin eruption: Secondary | ICD-10-CM | POA: Diagnosis not present

## 2019-01-12 MED ORDER — PREDNISONE 20 MG PO TABS: ORAL_TABLET | ORAL | 0 refills | Status: DC

## 2019-01-12 NOTE — Progress Notes (Signed)
[facility] at [facility] [address] [place], [place] [address] [phone]  Date:  01/12/2019   Name:  Wendy Hebert   DOB:  10-Apr-1933   MRN:  [id number]  PCP:  Darreld Mclean, MD    Chief Complaint: No chief complaint on file.   History of Present Illness:  Wendy Hebert is a 85 y.o. very pleasant female patient who presents with the following:  Virtual visit today for concern of rash Patient location is home Provider location is office Patient ID confirm the name and date of birth She does have history of lichen sclerosis on her pubic area, has used topical clobetasol.  Per my knowledge she has not seen a dermatologist for this  Married to [place], they have 1 son who lives in [place]  She notes an itchy rash- she thought it was maybe bug bites.  First noted this past weekend- 2 days ago She woke up with some itching just above her eyes  It now seems to have spread to her neck, trunk and elbow Is present on both sides of her body  No angioedema No SOB She did start on eliquis in January, but otherwise no new meds or foods  She has tried some OTC hydrocortisone which does help for a bit She did some gardening a week or so ago- she is not a big gardener however She has not been pulling weeds or vines   No fever or cough She does get sweats sometimes but this is not new  Patient Active Problem List   Diagnosis Date Noted  . Atrial fibrillation ([facility]) 10/01/2018  . Gait difficulty 03/20/2018  . Lichen sclerosus 62/69/4854  . History of right breast cancer 03/20/2018    Past Medical History:  Diagnosis Date  . Arthritis   . Cancer ([facility]) 2017   1.6 lumpectomy  . Lichen sclerosus     Past Surgical History:  Procedure Laterality Date  . BOWEL RESECTION    . BREAST LUMPECTOMY  2017  . CARDIOVERSION N/A 11/10/2018   Procedure: CARDIOVERSION;  Surgeon: [name], MD;  Location: [facility];   Service: Cardiovascular;  Laterality: N/A;  . CESAREAN SECTION    . HERNIA REPAIR    . TONSILLECTOMY      Social History   Tobacco Use  . Smoking status: Former Research scientist (life sciences)  . Smokeless tobacco: Never Used  . Tobacco comment: 30+ years  Substance Use Topics  . Alcohol use: Yes    Comment: occasionally  . Drug use: Never    No family history on file.  No Known Allergies  Medication list has been reviewed and updated.  Current Outpatient Medications on File Prior to Visit  Medication Sig Dispense Refill  . ALPRAZolam (XANAX) 0.5 MG tablet Take 0.25 mg by mouth at bedtime as needed for sleep.    Marland Kitchen apixaban (ELIQUIS) 5 MG TABS tablet Take 1 tablet (5 mg total) by mouth 2 (two) times daily. 180 tablet 2  . Ascorbic Acid (VITAMIN C) 1000 MG tablet Take 1,000 mg by mouth every evening.    . Calcium Carb-Cholecalciferol (CALCIUM 600 + D PO) Take 1 tablet by mouth every evening.    . clobetasol cream (TEMOVATE) 6.27 % Apply 1 application topically daily.    . colchicine 0.6 MG tablet Take 2 pills once then take one pill an hour later for acute gout.  May take 1 pill daily for 2-3  days more if needed 30 tablet 0  . metoprolol tartrate (LOPRESSOR) 25 MG tablet Take 1.5tab in the AM and 1 tab in the PM (Patient taking differently: Take 25-37.5 mg by mouth See admin instructions. Take 37.5 mg in the morning and 25 mg in the evening) 225 tablet 2  . Multiple Vitamin (MULTIVITAMIN WITH MINERALS) TABS tablet Take 1 tablet by mouth every evening.    . Multiple Vitamins-Minerals (PRESERVISION AREDS 2) CAPS Take 2 capsules by mouth every evening.    . Probiotic CAPS Take 1 capsule by mouth every evening.     No current facility-administered medications on file prior to visit.     Review of Systems:  As per HPI- otherwise negative. No fever cough  Physical Examination: There were no vitals filed for this visit. There were no vitals filed for this visit. There is no height or weight on file to  calculate BMI. Ideal Body Weight:    Pt observed on video today- she looks well No cough, wheezing, distress is noted She tried to show me her rash but it is hard to see on video.  It appears to be some type of contact dermatitis, appears to have discrete patches as opposed to a widespread rash She checks her blood pressure at home sometimes, reports it is low she does not recall the exact numbers  Assessment and Plan: Rash - Plan: predniSONE (DELTASONE) 20 MG tablet   [name] calls in with concern of a rash for 2 days today.  It is itchy, she thought it was bug bites.  However it has now spread, she was doing some gardening last weekend. No major contraindications to prednisone, so we will have her do a low-dose taper for itching I advised her that an in person visit may be necessary to properly evaluate this rash.  She is asked to contact me if not significantly better by Wednesday morning, in that case I can bring her in on Wednesday for evaluation Signed Lamar Blinks, MD

## 2019-01-12 NOTE — Telephone Encounter (Signed)
Pt c/o rash to bilateral eyelids, anterior elbow, bilateral jaw, neck. Pt stated the rash was red and bumpy. Pt denies any blisters. Pt stated the bumps are 1/8 of an inch in diameter. Rash started Saturday and first appeared on the eyelids and has since spread. Pt thinks she may be allergic to the Eliquis she is taking. Pt stated that she is having moderate itching and has tried OTC hydrocortisone cream. Pt stated she got some relief. Pt denies fever. Pt given care advice and verbalized understanding. Pt transferred to office to schedule virtual visit.  Reason for Disposition . [1] Severe localized itching AND [2] after 2 days of steroid cream  Answer Assessment - Initial Assessment Questions 1. APPEARANCE of RASH: "Describe the rash."      Red,bumpy 2. LOCATION: "Where is the rash located?"      Upper forearm right arm, under chin and neck, eyelids bilateral,  3. NUMBER: "How many spots are there?"     "I don't know" 4. SIZE: "How big are the spots?" (Inches, centimeters or compare to size of a coin)     Elbow:1/8 inch "good size bumps." 5. ONSET: "When did the rash start?"      Sat 6. ITCHING: "Does the rash itch?" If so, ask: "How bad is the itch?"  (Scale 1-10; or mild, moderate, severe)   moderate 7. PAIN: "Does the rash hurt?" If so, ask: "How bad is the pain?"  (Scale 1-10; or mild, moderate, severe)    no 8. OTHER SYMPTOMS: "Do you have any other symptoms?" (e.g., fever)    no 9. PREGNANCY: "Is there any chance you are pregnant?" "When was your last menstrual period?"     n/a  Protocols used: RASH OR REDNESS - LOCALIZED-A-AH

## 2019-01-12 NOTE — Patient Instructions (Signed)
It was nice to talk with you today, but I am sorry that you have have this annoying rash Please use the prednisone as directed, avoid NSAID medications such as ibuprofen or Aleve while on prednisone Tylenol is okay for aches and pains  As we discussed, please let me know if not significantly better by Wednesday morning.  If not better we can bring you into the office on Wednesday for evaluation

## 2019-01-14 NOTE — Progress Notes (Signed)
[facility] at [facility] [address], [place], [place] [address] [phone]  Date:  01/15/2019   Name:  Wendy Hebert   DOB:  Jan 04, 1933   MRN:  [id number]  PCP:  Darreld Mclean, MD    Chief Complaint: Rash (just showed up saturday on face, itching, redness)   History of Present Illness:  Wendy Hebert is a 85 y.o. very pleasant female patient who presents with the following:  History of atrial fibrillation, lichen sclerosus, breast cancer  Eliquis, Xanax, metoprolol, temporary prednisone Wendy Hebert is here today for an in person visit, due to rash-however, it seems that her rash is actually better and she wish to discuss a few other concerns We did a virtual visit on May 11, at that time I suspected some sort of contact dermatitis, question gardening We started her on prednisone, but I asked her to let us know if symptoms were not improved  Today she states that her rash is much better, and seems to be clearing up.  She is taking prednisone with no problems  She notes that after she gets out of the shower she may sweat.  This is not a new issue-she is wonders what might be causing it  She also needs a short term refill of her lopressor- she ran out of her mail away rx a couple of days early for some reason.  BP Readings from Last 3 Encounters:  01/15/19 126/72  11/10/18 110/73  10/29/18 108/74   She rarely checks her blood pressure at home, she notes that it was elevated last night, but it is normal now and was fine at last couple of office visits  She would like to transfer her oncology care from [facility] to [facility] health She was dx with breast cancer in 2017, she underwent lumpectomy and lymph node biopsy in September 2017.  Nodes were negative.  She did not have radiation, she is currently under active surveillance   She also notes a seborrheic keratosis on her left anterior chest wall, and a couple on her left temple  that she would like me to freeze today Patient Active Problem List   Diagnosis Date Noted  . Atrial fibrillation ([facility]) 10/01/2018  . Gait difficulty 03/20/2018  . Lichen sclerosus 03/55/9741  . History of right breast cancer 03/20/2018    Past Medical History:  Diagnosis Date  . Arthritis   . Cancer ([facility]) 2017   1.6 lumpectomy  . Lichen sclerosus     Past Surgical History:  Procedure Laterality Date  . BOWEL RESECTION    . BREAST LUMPECTOMY  2017  . CARDIOVERSION N/A 11/10/2018   Procedure: CARDIOVERSION;  Surgeon: [name], MD;  Location: [facility];  Service: Cardiovascular;  Laterality: N/A;  . CESAREAN SECTION    . HERNIA REPAIR    . TONSILLECTOMY      Social History   Tobacco Use  . Smoking status: Former Research scientist (life sciences)  . Smokeless tobacco: Never Used  . Tobacco comment: 30+ years  Substance Use Topics  . Alcohol use: Yes    Comment: occasionally  . Drug use: Never    History reviewed. No pertinent family history.  No Known Allergies  Medication list has been reviewed and updated.  Current Outpatient Medications on File Prior to Visit  Medication Sig Dispense Refill  . ALPRAZolam (XANAX) 0.5 MG tablet Take 0.25 mg by mouth at bedtime as needed for sleep.    Marland Kitchen  apixaban (ELIQUIS) 5 MG TABS tablet Take 1 tablet (5 mg total) by mouth 2 (two) times daily. 180 tablet 2  . Ascorbic Acid (VITAMIN C) 1000 MG tablet Take 1,000 mg by mouth every evening.    . Calcium Carb-Cholecalciferol (CALCIUM 600 + D PO) Take 1 tablet by mouth every evening.    . clobetasol cream (TEMOVATE) 4.40 % Apply 1 application topically daily.    . colchicine 0.6 MG tablet Take 2 pills once then take one pill an hour later for acute gout.  May take 1 pill daily for 2-3 days more if needed 30 tablet 0  . metoprolol tartrate (LOPRESSOR) 25 MG tablet Take 1.5tab in the AM and 1 tab in the PM (Patient taking differently: Take 25-37.5 mg by mouth See admin instructions. Take 37.5 mg in the  morning and 25 mg in the evening) 225 tablet 2  . Multiple Vitamin (MULTIVITAMIN WITH MINERALS) TABS tablet Take 1 tablet by mouth every evening.    . Multiple Vitamins-Minerals (PRESERVISION AREDS 2) CAPS Take 2 capsules by mouth every evening.    . predniSONE (DELTASONE) 20 MG tablet Take 40 mg my mouth daily for 3 days, then 20 mg a day for 4 days 10 tablet 0  . Probiotic CAPS Take 1 capsule by mouth every evening.     No current facility-administered medications on file prior to visit.     Review of Systems:  As per HPI- otherwise negative.  No fever or chills, no cough Physical Examination: Vitals:   01/15/19 1032  BP: 126/72  Pulse: 72  Resp: 18  Temp: 97.7 F (36.5 C)  SpO2: 95%   Vitals:   01/15/19 1032  Weight: 140 lb (63.5 kg)  Height: 5' 1.5" (1.562 m)   Body mass index is 26.02 kg/m. Ideal Body Weight: Weight in (lb) to have BMI = 25: 134.2  GEN: WDWN, NAD, Non-toxic, A & O x 3, minimal overweight, looks well HEENT: Atraumatic, Normocephalic. Neck supple. No masses, No LAD. Ears and Nose: No external deformity. CV: RRR, No M/G/R. No JVD. No thrill. No extra heart sounds. PULM: CTA B, no wheezes, crackles, rhonchi. No retractions. No resp. distress. No accessory muscle use. EXTR: No c/c/e NEURO Normal gait.  PSYCH: Normally interactive. Conversant. Not depressed or anxious appearing.  Calm demeanor.  Contact dermatitis on right arm, appears to be resolving  Sacha has seborrheic keratoses on her left anterior chest wall, and 2 at the left second hairline. Verbal consent obtained, liquid nitrogen x3 to all 3 lesions   Assessment and Plan: History of right breast cancer - Plan: Ambulatory referral to Hematology / Oncology  Atrial fibrillation, unspecified type ([facility]) - Plan: metoprolol tartrate (LOPRESSOR) 25 MG tablet  Seborrheic keratoses  Rash  Sweating abnormality  In office follow-up visit today.  I placed referral to [facility] health oncology per  patient request.  She wishes to transfer her breast cancer surveillance to this office. Refilled her metoprolol, as she has run a bit short.  She will get a temporary supply at her local [facility] notes that she was referred to dermatology in February, did not hear about this appointment.  I asked her to give them a call and provided phone number Cryotherapy to seborrheic keratosis as above Explained that I do not know exactly why she tends to have a sweat only after being in the shower.  I wonder if she may be getting too warm in the shower.  Suggested that she turn  the water to lukewarm for a moment or 2 before stepping out of the shower.  She will give this a try  Signed Wendy Blinks, MD

## 2019-01-15 ENCOUNTER — Ambulatory Visit (INDEPENDENT_AMBULATORY_CARE_PROVIDER_SITE_OTHER): Payer: Medicare Other | Admitting: Family Medicine

## 2019-01-15 ENCOUNTER — Other Ambulatory Visit: Payer: Self-pay

## 2019-01-15 ENCOUNTER — Encounter: Payer: Self-pay | Admitting: Family Medicine

## 2019-01-15 VITALS — BP 126/72 | HR 72 | Temp 97.7°F | Resp 18 | Ht 61.5 in | Wt 140.0 lb

## 2019-01-15 DIAGNOSIS — L821 Other seborrheic keratosis: Secondary | ICD-10-CM

## 2019-01-15 DIAGNOSIS — Z853 Personal history of malignant neoplasm of breast: Secondary | ICD-10-CM | POA: Diagnosis not present

## 2019-01-15 DIAGNOSIS — R21 Rash and other nonspecific skin eruption: Secondary | ICD-10-CM | POA: Diagnosis not present

## 2019-01-15 DIAGNOSIS — L749 Eccrine sweat disorder, unspecified: Secondary | ICD-10-CM | POA: Diagnosis not present

## 2019-01-15 DIAGNOSIS — I4891 Unspecified atrial fibrillation: Secondary | ICD-10-CM | POA: Diagnosis not present

## 2019-01-15 MED ORDER — METOPROLOL TARTRATE 25 MG PO TABS: ORAL_TABLET | ORAL | 0 refills | Status: DC

## 2019-01-15 NOTE — Patient Instructions (Signed)
Please give [facility] dermatology a call regarding your referral  Address: [address], [place], [place] [address] Phone: [phone]  I will send in a temporary refill of lopressor for you  Take care, let me know if any concerns!

## 2019-01-22 ENCOUNTER — Ambulatory Visit: Payer: Medicare Other | Admitting: Family Medicine

## 2019-01-22 ENCOUNTER — Other Ambulatory Visit: Payer: Self-pay

## 2019-01-22 ENCOUNTER — Telehealth: Payer: Self-pay

## 2019-01-22 ENCOUNTER — Encounter: Payer: Self-pay | Admitting: Family Medicine

## 2019-01-22 ENCOUNTER — Ambulatory Visit (INDEPENDENT_AMBULATORY_CARE_PROVIDER_SITE_OTHER): Payer: Medicare Other | Admitting: Family Medicine

## 2019-01-22 ENCOUNTER — Telehealth: Payer: Self-pay | Admitting: Oncology

## 2019-01-22 ENCOUNTER — Encounter: Payer: Medicare Other | Admitting: Family Medicine

## 2019-01-22 DIAGNOSIS — R39198 Other difficulties with micturition: Secondary | ICD-10-CM | POA: Diagnosis not present

## 2019-01-22 DIAGNOSIS — L239 Allergic contact dermatitis, unspecified cause: Secondary | ICD-10-CM | POA: Diagnosis not present

## 2019-01-22 NOTE — Telephone Encounter (Signed)
Started on neck-same "patch" looks like poison ivy but has not been in the grass. The rash does not look the same as last time. Itches and looks blisters. Treating calamine lotion and hydrocortisone lotion. Took benadril and nothing helps.   Urine flow is "different" which is possibly medication related.Placed her on your schedule this afternoon for virtual visit to discuss this with you.

## 2019-01-22 NOTE — Telephone Encounter (Signed)
A new patient appt has been scheduled for the pt to see Dr. Jana Hakim on 6/15 at 4pm. Pt has been made aware to arrive 15 minutes early.

## 2019-01-22 NOTE — Progress Notes (Signed)
[place] at [facility] [address], [place], [place] [address] [phone]  Date:  01/22/2019   Name:  Wendy Hebert   DOB:  [birth year]   MRN:  [id number]  PCP:  Darreld Mclean, MD    Chief Complaint: No chief complaint on file.   History of Present Illness:  Wendy Hebert is a 85 y.o. very pleasant female patient who presents with the following:  Virtual visit today to discuss rash Pt location is home Provider location is office Pt ID confirmed with 2 identifiers, she gives consent for virtual visit today  Last virtual visit on 5/11 at which time I rx prednisone for presumed contact derm I saw her in the office about one week ago; at that time her rash was resolved and we actually discussed other things  She finished out the prednisone about 3 days ago, and the rash seemed to return yesterday The rash has returned on her neck, but not on her face as much a last time She has tried some calamine and topical hydrocortisone  She has not been doing any gardening since we first talked about the rash The rash is not really that bothersome, she is just concerned about it.  It seems to be the same thing she had prior to using prednisone  She also has a change in her urine flow- over the last 3-4 days she notes that her bladder seems to be slow to empty, and if she waits at the end of her void she will get some more urine out No pain, no burning, no itching  No back pain, no abd pain, no hematuria No fever She does not feel like her bladder is overly full Patient Active Problem List   Diagnosis Date Noted  . Atrial fibrillation ([place]) 10/01/2018  . Gait difficulty 03/20/2018  . Lichen sclerosus 67/[birth year]/1937  . History of right breast cancer 03/20/2018    Past Medical History:  Diagnosis Date  . Arthritis   . Cancer ([place]) 2017   1.6 lumpectomy  . Lichen sclerosus     Past Surgical History:  Procedure Laterality  Date  . BOWEL RESECTION    . BREAST LUMPECTOMY  2017  . CARDIOVERSION N/A 11/10/2018   Procedure: CARDIOVERSION;  Surgeon: [name], MD;  Location: [place];  Service: Cardiovascular;  Laterality: N/A;  . CESAREAN SECTION    . HERNIA REPAIR    . TONSILLECTOMY      Social History   Tobacco Use  . Smoking status: Former Research scientist (life sciences)  . Smokeless tobacco: Never Used  . Tobacco comment: 30+ years  Substance Use Topics  . Alcohol use: Yes    Comment: occasionally  . Drug use: Never    No family history on file.  No Known Allergies  Medication list has been reviewed and updated.  Current Outpatient Medications on File Prior to Visit  Medication Sig Dispense Refill  . ALPRAZolam (XANAX) 0.5 MG tablet Take 0.25 mg by mouth at bedtime as needed for sleep.    Marland Kitchen apixaban (ELIQUIS) 5 MG TABS tablet Take 1 tablet (5 mg total) by mouth 2 (two) times daily. 180 tablet 2  . Ascorbic Acid (VITAMIN C) 1000 MG tablet Take 1,000 mg by mouth every evening.    . Calcium Carb-Cholecalciferol (CALCIUM 600 + D PO) Take 1 tablet by mouth every evening.    . clobetasol cream (TEMOVATE) 9.02 % Apply 1 application topically daily.    Marland Kitchen  colchicine 0.6 MG tablet Take 2 pills once then take one pill an hour later for acute gout.  May take 1 pill daily for 2-3 days more if needed 30 tablet 0  . metoprolol tartrate (LOPRESSOR) 25 MG tablet Take 1.5tab in the AM and 1 tab in the PM 50 tablet 0  . Multiple Vitamin (MULTIVITAMIN WITH MINERALS) TABS tablet Take 1 tablet by mouth every evening.    . Multiple Vitamins-Minerals (PRESERVISION AREDS 2) CAPS Take 2 capsules by mouth every evening.    . Probiotic CAPS Take 1 capsule by mouth every evening.     No current facility-administered medications on file prior to visit.     Review of Systems:  As per HPI- otherwise negative.   Physical Examination: There were no vitals filed for this visit. There were no vitals filed for this visit. There is no  height or weight on file to calculate BMI. Ideal Body Weight:    Spoke to pt on the phone  She reports recent vitals as follows: 139/88 recent BP, pulse 69 Sounds well, no cough, wheezing, distress  Assessment and Plan: Voiding difficulty - Plan: Urine Culture  Allergic contact dermatitis, unspecified trigger  Visit with patient about 10 days ago for what sounds like contact dermatitis on her skin.  We treated her with prednisone, when she finished the steroid the rash has returned somewhat.  I advised her that we could put her back on prednisone, but it might be best just to allow this to resolve.  It is not really bothering her that much right now, she just was not sure what to do.  She is okay with observing the rash for now, will use topicals as needed.  She will let me know if any concerns  She also notes a nonspecific change in her voiding function.  No pain or other symptoms.  Recent renal function was normal for age. She will bring in a urine sample so we can do a culture.  Patient knows to contact me if any concerns or if she is not feeling well. Phone 10:40 today  Signed Lamar Blinks, MD

## 2019-01-22 NOTE — Telephone Encounter (Signed)
Copied from [place] [id number]. Topic: General - Other >> Jan 22, 2019  9:56 AM Leward Quan A wrote: Reason for CRM: Patient called to inform Dr Lorelei Pont that the rash that she was seen for recently came back again. She also have concerns that he urine flow have slowed down some. She would like a call back from someone possibly to answer her questions and get her some help. Ph# [phone]

## 2019-01-26 DIAGNOSIS — M25572 Pain in left ankle and joints of left foot: Secondary | ICD-10-CM | POA: Diagnosis not present

## 2019-02-15 NOTE — Progress Notes (Signed)
[place]  [phone] [phone]    ID: EMRIE GAYLE DOB: September 30, 1932  MR#: [id number]  BUL#:[id number]  Patient Care Team: Darreld Mclean, MD as PCP - General (Family Medicine) Wendy Needs, NP as Nurse Practitioner (Nurse Practitioner) Hebert, Wendy Dad, MD as Consulting Physician (Oncology) Modesto Charon, PA-C as Physician Assistant (Obstetrics and Gynecology) Wendy Panning, MD as Referring Physician (Oncology) Wendy Medicus, MD as Consulting Physician (Internal Medicine) Wendy Pace, MD as Consulting Physician (Surgical Oncology) OTHER MD:   CHIEF COMPLAINT: Estrogen receptor positive breast cancer  CURRENT TREATMENT: Observation   HISTORY OF CURRENT ILLNESS: Wendy Hebert was referred by Dr. Janett Billow Hebert for continuing breast cancer surveillance.   The patient originally was found to have a right breast abnormality on screening mammography August 2017.  Additional views confirmed a focal distortion in the inner right breast and ultrasound found a 1.4 cm irregular hypoechoic mass at the 3 o'clock position 5 cm from the nipple. There were no abnormal right axillary lymph nodes noted.   Biopsy under Dr. Genoveva Hebert 05/11/2016 showed ([place] [id number]) invasive ductal carcinoma, grade 2, estrogen receptor and progesterone receptor both 99% positive with strong staining intensity, HER-2 equivocal at 2+, with FISH attempts failed x2.  The patient then proceeded to lumpectomy and sentinel lymph node biopsy (HPS [id number]) on 05/24/2016 for a 1.4 cm invasive ductal carcinoma, with negative although very close (less than 1 mm) margins, and with no HER-2 amplification, signals ratio being 1.0 and number per cell 1.6.  A single sentinel lymph node was removed.  Oncotype recurrence score of 12 predicted no benefit from adjuvant chemotherapy.  The patient did not receive adjuvant radiation--she does not recall it being offered.  Adjuvantly  she was treated with anastrozole and letrozole but had significant arthralgias and myalgias with and after switching to tamoxifen she developed a bothersome aginal discharge.  Accordingly she is being followed with observation alone  She had mammography at [facility] 07/15/2018 showing the breast density to be category B.  They noted lumpectomy changes in the medial right breast but no evidence of malignancy.  Note also a right breast ultrasound on 06/27/2017 for evaluation of a new erythematous lump on her right breast lumpectomy scar.  This showed mild skin thickening consistent with inflammation such as a suture granuloma or keloid formation.  There was no discrete mass and the underlying fibroglandular tissue was unremarkable.  The patient's subsequent history is as detailed below.    INTERVAL HISTORY: Wendy Hebert was evaluated in the breast cancer clinic on 02/16/2019.    REVIEW OF SYSTEMS: Wendy Hebert does not have a normal exercise routine due to her difficulty walking.  This is due to gout which she says has been a problem for the past 20 years.  She denies unusual headaches, visual changes, nausea, vomiting, stiff neck, or dizziness dizziness,. There has been no cough, phlegm production, or pleurisy, no chest pain or pressure, and no change in bowel or bladder habits. The patient denies fever, rash, bleeding despite being on apixaban, unexplained fatigue or unexplained weight loss. A detailed review of systems was otherwise entirely negative.   PAST MEDICAL HISTORY: Past Medical History:  Diagnosis Date  . Arthritis   . Cancer ([place]) 2017   1.6 lumpectomy  . Lichen sclerosus   Gout   PAST SURGICAL HISTORY: Past Surgical History:  Procedure Laterality Date  . APPENDECTOMY    . BOWEL RESECTION    . BREAST LUMPECTOMY  2017  .  CARDIOVERSION N/A 11/10/2018   Procedure: CARDIOVERSION;  Surgeon: [name], MD;  Location: [place];  Service: Cardiovascular;  Laterality: N/A;  .  CESAREAN SECTION    . HERNIA REPAIR    . TONSILLECTOMY       FAMILY HISTORY: Family History  Problem Relation Age of Onset  . Cancer Father        Lung  . Cancer Sister    Wendy Hebert's father died from lung cancer at age 46. Patients' mother died from chronic leukemia at age 57. The patient has 1 sister. Her sister was diagnosed with breast cancer in her 58s. Patient denies anyone in her family having ovarian, prostate, or pancreatic cancer.    GYNECOLOGIC HISTORY:  No LMP recorded. Patient is postmenopausal. Menarche: 85 years old Age at first live birth: 85 years old Wendy Hebert: 1 LMP: mid 55's Contraceptive:  HRT: yes, several years  Hysterectomy?: no BSO?: no   SOCIAL HISTORY: (Current as of 02/16/2019) Wendy Hebert retired from the [place] [employer]. She is at home with her husband, Wendy Hebert who is a former bookkeeper.  There son, is Wendy Hebert (goes by Wendy Hebert), is a Scientist, water quality in [place]. [name] has a grandson who is 53 years old as of 02/2019. She belongs to a [employer].    ADVANCED DIRECTIVES: In the absence of any documentation, Wendy Hebert's spouse, Wendy Hebert, is her healthcare power of attorney.      HEALTH MAINTENANCE: Social History   Tobacco Use  . Smoking status: Former Research scientist (life sciences)  . Smokeless tobacco: Never Used  . Tobacco comment: 30+ years  Substance Use Topics  . Alcohol use: Yes    Comment: occasionally  . Drug use: Never    Colonoscopy:   PAP: 09/17/2018  Bone density: osteopenic 07/2016 Mammography:   No Known Allergies  Current Outpatient Medications  Medication Sig Dispense Refill  . apixaban (ELIQUIS) 5 MG TABS tablet Take 1 tablet (5 mg total) by mouth 2 (two) times daily. 180 tablet 2  . Calcium Carb-Cholecalciferol (CALCIUM 600 + D PO) Take 1 tablet by mouth every evening.    . clobetasol cream (TEMOVATE) 6.23 % Apply 1 application topically daily.    Marland Kitchen HYDROcodone-acetaminophen (NORCO/VICODIN) 5-325 MG tablet Take 1 tablet by mouth every 6 (six) hours as needed  for up to 5 days. 20 tablet 0  . metoprolol tartrate (LOPRESSOR) 25 MG tablet Take 1.5tab in the AM and 1 tab in the PM 50 tablet 0  . Multiple Vitamin (MULTIVITAMIN WITH MINERALS) TABS tablet Take 1 tablet by mouth every evening.    . Multiple Vitamins-Minerals (PRESERVISION AREDS 2) CAPS Take 2 capsules by mouth every evening.    . predniSONE (DELTASONE) 20 MG tablet Take 2 tablets (40 mg total) by mouth daily with breakfast for 5 days. 10 tablet 0  . Probiotic CAPS Take 1 capsule by mouth every evening.    . vitamin C (ASCORBIC ACID) 500 MG tablet Take 500 mg by mouth daily.     No current facility-administered medications for this visit.      OBJECTIVE: Older white woman examined in a wheelchair  Vitals:   02/16/19 1603  BP: (!) 138/97  Pulse: 87  Resp: 18  Temp: 97.8 F (36.6 C)  SpO2: 93%     Body mass index is 27.01 kg/m.   Wt Readings from Last 3 Encounters:  02/16/19 145 lb 4.8 oz (65.9 kg)  01/15/19 140 lb (63.5 kg)  10/29/18 144 lb (65.3 kg)  ECOG FS:2 - Symptomatic, <50% confined to bed  Ocular: Sclerae unicteric, pupils round and equal Wearing a mask Lymphatic: No cervical or supraclavicular adenopathy Lungs no rales or rhonchi Heart regular rate and rhythm Abd soft, nontender, positive bowel sounds MSK kyphosis but no focal spinal tenderness, obvious arthritis involving fingers right greater than left Neuro: non-focal, well-oriented, positive affect Breasts: The right breast is status post lumpectomy.  There is no evidence of breast cancer recurrence.  There is a palpable erythematous lesion medial to the areola which is imaged below.  The left breast is unremarkable.  Both axillae are benign  Right breast 02/16/2019     LAB RESULTS:  CMP     Component Value Date/Time   NA 138 11/10/2018 0858   NA 141 07/03/2018   K 4.1 11/10/2018 0858   CL 103 11/10/2018 0858   CL 102 07/03/2018   CO2 26 11/10/2018 0858   CO2 23 07/03/2018   GLUCOSE 103  (H) 11/10/2018 0858   BUN 12 11/10/2018 0858   BUN 16 07/03/2018   CREATININE 1.05 (H) 11/10/2018 0858   CALCIUM 9.0 11/10/2018 0858   CALCIUM 9.3 07/03/2018   AST 21 07/03/2018   ALT 20 07/03/2018   ALKPHOS 83 07/03/2018   GFRNONAA 49 (L) 11/10/2018 0858   GFRNONAA 47 07/03/2018   GFRAA 57 (L) 11/10/2018 0858    No results found for: TOTALPROTELP, ALBUMINELP, A1GS, A2GS, BETS, BETA2SER, GAMS, MSPIKE, SPEI  No results found for: KPAFRELGTCHN, LAMBDASER, KAPLAMBRATIO  Lab Results  Component Value Date   WBC 8.4 11/10/2018   NEUTROABS 6.8 10/17/2018   HGB 14.8 11/10/2018   HCT 45.2 11/10/2018   MCV 95.0 11/10/2018   PLT 305 11/10/2018    _0 @  No results found for: LABCA2  No components found for: [id number]  No results for input(s): INR in the last 168 hours.  No results found for: LABCA2  No results found for: CHY850  No results found for: YDX412  No results found for: INO676  No results found for: CA2729  No components found for: HGQUANT  No results found for: CEA1 / No results found for: CEA1   No results found for: AFPTUMOR  No results found for: CHROMOGRNA  No results found for: PSA1  No visits with results within 3 Day(s) from this visit.  Latest known visit with results is:  Hospital Outpatient Visit on 11/10/2018  Component Date Value Ref Range Status  . Sodium 11/10/2018 138  135 - 145 mmol/L Final  . Potassium 11/10/2018 4.1  3.5 - 5.1 mmol/L Final  . Chloride 11/10/2018 103  98 - 111 mmol/L Final  . CO2 11/10/2018 26  22 - 32 mmol/L Final  . Glucose, Bld 11/10/2018 103* 70 - 99 mg/dL Final  . BUN 11/10/2018 12  8 - 23 mg/dL Final  . Creatinine, Ser 11/10/2018 1.05* 0.44 - 1.00 mg/dL Final  . Calcium 11/10/2018 9.0  8.9 - 10.3 mg/dL Final  . GFR calc non Af Amer 11/10/2018 49* >60 mL/min Final  . GFR calc Af Amer 11/10/2018 57* >60 mL/min Final  . Anion gap 11/10/2018 9  5 - 15 Final   Performed at [facility], [place] [address], [place], [place] [address]  . WBC 11/10/2018 8.4  4.0 - 10.5 K/uL Final  . RBC 11/10/2018 4.76  3.87 - 5.11 MIL/uL Final  . Hemoglobin 11/10/2018 14.8  12.0 - 15.0 g/dL Final  . HCT 11/10/2018 45.2  36.0 - 46.0 %  Final  . MCV 11/10/2018 95.0  80.0 - 100.0 fL Final  . MCH 11/10/2018 31.1  26.0 - 34.0 pg Final  . MCHC 11/10/2018 32.7  30.0 - 36.0 g/dL Final  . RDW 11/10/2018 13.5  11.5 - 15.5 % Final  . Platelets 11/10/2018 305  150 - 400 K/uL Final  . nRBC 11/10/2018 0.0  0.0 - 0.2 % Final   Performed at [facility], [place] [address], [place], [place] [address]    (this displays the last labs from the last 3 days)  No results found for: TOTALPROTELP, ALBUMINELP, A1GS, A2GS, BETS, BETA2SER, GAMS, MSPIKE, SPEI (this displays SPEP labs)  No results found for: KPAFRELGTCHN, LAMBDASER, KAPLAMBRATIO (kappa/lambda light chains)  No results found for: HGBA, HGBA2QUANT, HGBFQUANT, HGBSQUAN (Hemoglobinopathy evaluation)   No results found for: LDH  No results found for: IRON, TIBC, IRONPCTSAT (Iron and TIBC)  No results found for: FERRITIN  Urinalysis    Component Value Date/Time   PROTEINUR 6.6 07/03/2018     STUDIES:  CLINICAL DATA: Right-sided abdominal pain after a fall on Saturday  EXAM: CT ABDOMEN AND PELVIS WITH CONTRAST  TECHNIQUE: Multidetector CT imaging of the abdomen and pelvis was performed using the standard protocol following bolus administration of intravenous contrast.  CONTRAST: 100 cc Omnipaque 350 intravenous  COMPARISON: None.  FINDINGS: Lower chest: Trace right pleural effusion which is low-density. Mild right lower lobe atelectasis.  Hepatobiliary: No evidence of injury.No evidence of biliary obstruction or stone.  Pancreas: Unremarkable.  Spleen: Unremarkable.  Adrenals/Urinary Tract: Negative adrenals. No hydronephrosis or stone. Unremarkable bladder.  Stomach/Bowel: Postoperative ascending colon which is stool filled and  may be aneurysmal. The appendix is no longer present. No bowel obstruction or evident inflammation. No generalized stool retention. Mild distal colonic diverticulosis.  Vascular/Lymphatic: Diffuse atherosclerotic calcification. No acute vascular finding. No mass or adenopathy.  Reproductive:Negative  Other: No ascites or pneumoperitoneum  Musculoskeletal: Posterior right tenth and eleventh rib fractures.  Diffuse lower thoracic and upper lumbar spinal degeneration. There is a notable left paracentral to foraminal superiorly migrating extrusion at L3-4 with high-grade impingement.  IMPRESSION: 1. Nondisplaced right posterior tenth and eleventh rib fractures. 2. Small, low-density sympathetic right pleural effusion with atelectasis. 3. No evidence of intra-abdominal injury. 4. Large left paracentral to foraminal extrusion at L3-4.   Electronically Signed  By: Monte Fantasia M.D.  On: 09/23/2018 04:51  ELIGIBLE FOR AVAILABLE RESEARCH PROTOCOL:no   ASSESSMENT: 85 y.o. [place], [place] woman status post right upper inner quadrant lumpectomy 05/24/2016 for a pT1c pN0, stage IA invasive ductal carcinoma, grade 2, strongly estrogen and progesterone receptor positive, HER-2 nonamplified, with close but negative margins; a single sentinel lymph node was removed   (1) Oncotype score of 12 predicts a risk of recurrence outside the breast over the next 10 years of 8% if the patient takes tamoxifen for 5 years.  It also predicts no benefit from adjuvant chemotherapy.  (2) patient declined adjuvant radiation  (3) intolerant of antiestrogens  (a) anastrozole and letrozole caused arthralgias/myalgias  (b) tamoxifen because vaginal discharge problems  (4) osteopenia with bone density November 2017  (a) s/Hebert alendronate x7 years     PLAN: I spent approximately 50 minutes face to face with Rise Paganini with more than 50% of that time spent in counseling and coordination of care. Specifically  we reviewed the biology of the patient's diagnosis and the specifics of her situation.  She had an early stage breast cancer removed September 2017.  This  was estrogen receptor positive.  She had close margins but they were negative.  We have good data that adjuvant radiation can be bypassed in women over 70 with small node-negative estrogen receptor positive tumors like Wendy Hebert's.  In those cases however the patient took antiestrogens for 5 years.  Wendy Hebert tried to take antiestrogens but was not able to tolerate them.  It is now of course too late to proceed to adjuvant radiation.  Accordingly she is being followed with observation alone.  We discussed the possibility of intensified screening adding breast MRI yearly, but given her overall health status and age and the fact that her breasts are density category B, I am not uncomfortable following her with a yearly mammogram with tomography.  She is comfortable with that as well.  She would prefer to receive that in [place] and I am placing the order for the breast center in November.  She thinks the lesion in the right breast near the areola may be changing.  I went ahead and photographed it.  She did have an ultrasound of that area in October 2018 and it showed no suspicious features then.  We will of course follow this in subsequent visits  She will see me again in early December.  Visits will be yearly from that point  She knows to call for any other problems that may develop before then.   Wendy Hebert has a good understanding of the overall plan. She agrees with it. She knows the goal of treatment in her case is cure. She will call with any problems that may develop before her next visit here.    Hebert, Wendy Dad, MD  02/16/19 4:55 PM Medical Oncology and Hematology [facility] [address] [place], [place] [address] Tel. [phone]    Fax. [phone]   I, Jacqualyn Posey am acting as a Education administrator for Chauncey Cruel, MD.    I, Lurline Del MD, have reviewed the above documentation for accuracy and completeness, and I agree with the above.

## 2019-02-16 ENCOUNTER — Ambulatory Visit (INDEPENDENT_AMBULATORY_CARE_PROVIDER_SITE_OTHER): Payer: Medicare Other | Admitting: Family Medicine

## 2019-02-16 ENCOUNTER — Other Ambulatory Visit: Payer: Self-pay

## 2019-02-16 ENCOUNTER — Inpatient Hospital Stay: Payer: Medicare Other | Attending: Oncology | Admitting: Oncology

## 2019-02-16 ENCOUNTER — Encounter: Payer: Self-pay | Admitting: Oncology

## 2019-02-16 ENCOUNTER — Encounter: Payer: Self-pay | Admitting: Family Medicine

## 2019-02-16 DIAGNOSIS — M109 Gout, unspecified: Secondary | ICD-10-CM

## 2019-02-16 DIAGNOSIS — C50211 Malignant neoplasm of upper-inner quadrant of right female breast: Secondary | ICD-10-CM | POA: Diagnosis not present

## 2019-02-16 DIAGNOSIS — Z87891 Personal history of nicotine dependence: Secondary | ICD-10-CM

## 2019-02-16 DIAGNOSIS — Z170 Estrogen receptor positive status [ER+]: Secondary | ICD-10-CM | POA: Diagnosis not present

## 2019-02-16 DIAGNOSIS — M8580 Other specified disorders of bone density and structure, unspecified site: Secondary | ICD-10-CM | POA: Diagnosis not present

## 2019-02-16 DIAGNOSIS — Z79899 Other long term (current) drug therapy: Secondary | ICD-10-CM | POA: Diagnosis not present

## 2019-02-16 MED ORDER — HYDROCODONE-ACETAMINOPHEN 5-325 MG PO TABS: 1.0000 | ORAL_TABLET | Freq: Four times a day (QID) | ORAL | 0 refills | Status: AC | PRN

## 2019-02-16 MED ORDER — PREDNISONE 20 MG PO TABS: 40.0000 mg | ORAL_TABLET | Freq: Every day | ORAL | 0 refills | Status: AC

## 2019-02-16 NOTE — Progress Notes (Signed)
CC: Gout flare.  Subjective: Patient is a 85 y.o. female here for gout flare. Due to COVID-19 pandemic, we are interacting via web portal for an electronic face-to-face visit. I verified patient's ID using 2 identifiers. Patient agreed to proceed with visit via this method. Patient is at home, I am at home. Patient and I are present for visit.   Pt w hx of gout that mainly affects her feet. Started 3 d ago, did have some sea food. Used to take NSAIDs to help with pain, now on Eliquis though. She is having pain, swelling, some redness and difficulty walking. Colchicine helped in past, but it affected her urination so she would like to avoid that if possible.   ROS: MSK: +foot pain  Past Medical History:  Diagnosis Date  . Arthritis   . Cancer ([place]) 2017   1.6 lumpectomy  . Lichen sclerosus     Objective: No conversational dyspnea Age appropriate judgment and insight Nml affect and mood  Assessment and Plan: Acute gout of foot, unspecified cause, unspecified laterality - Plan: predniSONE (DELTASONE) 20 MG tablet, HYDROcodone-acetaminophen (NORCO/VICODIN) 5-325 MG tablet States she normally gets hydrocodone for gout flares and does well. Would like NSAID, will try pred instead. She has appt with reg PCP next week, can discuss maintenance therapy with recent freq flares.  The patient voiced understanding and agreement to the plan.  [place], DO 02/16/19  10:13 AM

## 2019-02-19 ENCOUNTER — Encounter: Payer: Medicare Other | Admitting: Family Medicine

## 2019-02-24 NOTE — Progress Notes (Addendum)
[place] at [facility] [address], [place], [place] [address] [phone]  Date:  02/25/2019   Name:  Wendy Hebert   DOB:  October 04, 1932   MRN:  [id number]  PCP:  Darreld Mclean, MD    Chief Complaint: Annual Exam   History of Present Illness:  Wendy Hebert is a 85 y.o. very pleasant female patient who presents with the following:  Patient here today for complete physical.  I first met her about a year ago Married to [place], they have 1 son and 1 grandson who is now Engineer, technical sales- they live in [place].   History of atrial fibrillation, lichen sclerosus, breast cancer diagnosed in 2017.  Status post lumpectomy and sentinel node biopsy.  No additional therapy given so far-she was not able to tolerate antiestrogen medication.  Currently she is under observation, Dr. Jana Hakim She visited with her oncologist just recently  We also happened to diagnose atrial fib in January, she is now seeing cardiology is taking Eliquis, metoprolol.  She did undergo cardioversion and had been in sinus rhythm at most recent cardiology visit She thinks she might be in A fib- she noted the pulse ox was skipping around on recent oncology visit  No CP, her baseline SOB is better than normal She does not otherwise feel when she is in A fib   She notes that her BM have decreased in frequency from 3-4 a day to once a day. She is willing to have cologuard testing done   Mammogram: will order for November DEXA scan: declines today  Labs: Some done in March, BMP and CBC - she is not fasting but just had cereal  Immunizations: Can offer tetanus, pneumonia, shingles She is interested in a tetanus shot- she thinks her last ws 10 years ago She did have both pneumonia shots- she is not sure of the dates, but feels certain that she had both shots.   Colon cancer screening:  Per her recollection this was done 10 years ago but the prep was not great.    Last year referred  her to neurology due to difficulty with her gait-they really did not find anything specifically wrong  She has had a few episodes of gout over the last several months. She has had gout for 20 years but it had not bothered her that much until recently.  She is interested in trying allopurinol  Seemed to be triggered by diet  Will generally show up on her feet Patient Active Problem List   Diagnosis Date Noted  . Malignant neoplasm of upper-inner quadrant of right breast in female, estrogen receptor positive ([place]) 02/16/2019  . Atrial fibrillation ([place]) 10/01/2018  . Gait difficulty 03/20/2018  . Lichen sclerosus 81/77/1165  . History of right breast cancer 03/20/2018    Past Medical History:  Diagnosis Date  . Arthritis   . Cancer ([place]) 2017   1.6 lumpectomy  . Lichen sclerosus     Past Surgical History:  Procedure Laterality Date  . APPENDECTOMY    . BOWEL RESECTION    . BREAST LUMPECTOMY  2017  . CARDIOVERSION N/A 11/10/2018   Procedure: CARDIOVERSION;  Surgeon: [name], MD;  Location: [facility];  Service: Cardiovascular;  Laterality: N/A;  . CESAREAN SECTION    . HERNIA REPAIR    . TONSILLECTOMY      Social History   Tobacco Use  . Smoking status: Former Research scientist (life sciences)  . Smokeless tobacco:  Never Used  . Tobacco comment: 30+ years  Substance Use Topics  . Alcohol use: Yes    Comment: occasionally  . Drug use: Never    Family History  Problem Relation Age of Onset  . Cancer Father        Lung  . Cancer Sister     No Known Allergies  Medication list has been reviewed and updated.  Current Outpatient Medications on File Prior to Visit  Medication Sig Dispense Refill  . apixaban (ELIQUIS) 5 MG TABS tablet Take 1 tablet (5 mg total) by mouth 2 (two) times daily. 180 tablet 2  . Calcium Carb-Cholecalciferol (CALCIUM 600 + D PO) Take 1 tablet by mouth every evening.    . clobetasol cream (TEMOVATE) 2.99 % Apply 1 application topically daily.    . metoprolol  tartrate (LOPRESSOR) 25 MG tablet Take 1.5tab in the AM and 1 tab in the PM 50 tablet 0  . Multiple Vitamin (MULTIVITAMIN WITH MINERALS) TABS tablet Take 1 tablet by mouth every evening.    . Multiple Vitamins-Minerals (PRESERVISION AREDS 2) CAPS Take 2 capsules by mouth every evening.    . Probiotic CAPS Take 1 capsule by mouth every evening.    . vitamin C (ASCORBIC ACID) 500 MG tablet Take 500 mg by mouth daily.     No current facility-administered medications on file prior to visit.     Review of Systems:  As per HPI- otherwise negative. No fever or chills No CP or SOB   Physical Examination: Vitals:   02/25/19 0938  BP: 110/70  Pulse: 89  Resp: 16  Temp: 97.6 F (36.4 C)  SpO2: 98%   Vitals:   02/25/19 0938  Weight: 142 lb (64.4 kg)  Height: 5' 1.5" (1.562 m)   Body mass index is 26.4 kg/m. Ideal Body Weight: Weight in (lb) to have BMI = 25: 134.2  GEN: WDWN, NAD, Non-toxic, A & O x 3, mild overweight, looks well  HEENT: Atraumatic, Normocephalic. Neck supple. No masses, No LAD. Ears and Nose: No external deformity. CV: Rate controlled a fib, No M/G/R. No JVD. No thrill. No extra heart sounds. PULM: CTA B, no wheezes, crackles, rhonchi. No retractions. No resp. distress. No accessory muscle use. ABD: S, NT, ND. No rebound. No HSM. EXTR: No c/c/e NEURO Normal gait for patient, somewhat slow.  Uses cane PSYCH: Normally interactive. Conversant. Not depressed or anxious appearing.  Calm demeanor.  Small wound on her left forearm- does not need any further treatment but she does need a tetanus vaccine  She also requests cryotherapy to likely actinic seborrheic keratoses, one on her left forehead and one on her left chest.  Verbal consent obtained, applied liquid nitrogen x3 to both these lesions  Assessment and Plan:   ICD-10-CM   1. Physical exam  Z00.00   2. Screening for colon cancer  Z12.11   3. History of breast cancer  Z85.3 MM 3D SCREEN BREAST BILATERAL  4.  History of gout  Z87.39 Uric acid  5. Screening for diabetes mellitus  Z13.1 Comprehensive metabolic panel    Hemoglobin A1c  6. Screening for hyperlipidemia  Z13.220 Lipid panel  7. Idiopathic chronic gout of left foot without tophus  M1A.0720 allopurinol (ZYLOPRIM) 100 MG tablet  8. Immunization due  Z23 Tdap vaccine greater than or equal to 7yo IM   Physical exam and a couple of other concerns today. Labs pending as above.  We gave her a Tdap vaccine.  I do  not have records of her pneumonia vaccines, but she feels certain she is received both Prevnar and Pneumovax Suggested Shingrix at the drugstore We will have her try allopurinol 100 mg daily for gout prophylaxis.  Uric acid level pending today  Will plan further follow- up pending labs.  She does seem to be in atrial fibrillation again today.  She is anticoagulated, and rate control.  No symptoms.  Asked her to follow-up with her outpatient cardiologist at her earliest convenience   Follow-up: No follow-ups on file.  Meds ordered this encounter  Medications  . allopurinol (ZYLOPRIM) 100 MG tablet    Sig: Take 1 tablet (100 mg total) by mouth daily.    Dispense:  90 tablet    Refill:  6   Orders Placed This Encounter  Procedures  . MM 3D SCREEN BREAST BILATERAL  . Tdap vaccine greater than or equal to 7yo IM  . Comprehensive metabolic panel  . Hemoglobin A1c  . Lipid panel  . Uric acid    Signed Lamar Blinks, MD  Received her labs, message to pt  Results for orders placed or performed in visit on 02/25/19  Comprehensive metabolic panel  Result Value Ref Range   Sodium 142 135 - 145 mEq/L   Potassium 4.1 3.5 - 5.1 mEq/L   Chloride 105 96 - 112 mEq/L   CO2 28 19 - 32 mEq/L   Glucose, Bld 80 70 - 99 mg/dL   BUN 23 6 - 23 mg/dL   Creatinine, Ser 1.09 0.40 - 1.20 mg/dL   Total Bilirubin 0.7 0.2 - 1.2 mg/dL   Alkaline Phosphatase 88 39 - 117 U/L   AST 15 0 - 37 U/L   ALT 23 0 - 35 U/L   Total Protein 6.3 6.0 -  8.3 g/dL   Albumin 4.0 3.5 - 5.2 g/dL   Calcium 9.1 8.4 - 10.5 mg/dL   GFR 47.84 (L) >60.00 mL/min  Hemoglobin A1c  Result Value Ref Range   Hgb A1c MFr Bld 6.1 4.6 - 6.5 %  Lipid panel  Result Value Ref Range   Cholesterol 149 0 - 200 mg/dL   Triglycerides 160.0 (H) 0.0 - 149.0 mg/dL   HDL 55.30 >39.00 mg/dL   VLDL 32.0 0.0 - 40.0 mg/dL   LDL Cholesterol 62 0 - 99 mg/dL   Total CHOL/HDL Ratio 3    NonHDL 93.56   Uric acid  Result Value Ref Range   Uric Acid, Serum 8.3 (H) 2.4 - 7.0 mg/dL

## 2019-02-25 ENCOUNTER — Encounter: Payer: Self-pay | Admitting: Family Medicine

## 2019-02-25 ENCOUNTER — Ambulatory Visit (INDEPENDENT_AMBULATORY_CARE_PROVIDER_SITE_OTHER): Payer: Medicare Other | Admitting: Family Medicine

## 2019-02-25 ENCOUNTER — Other Ambulatory Visit: Payer: Self-pay

## 2019-02-25 VITALS — BP 110/70 | HR 89 | Temp 97.6°F | Resp 16 | Ht 61.5 in | Wt 142.0 lb

## 2019-02-25 DIAGNOSIS — Z853 Personal history of malignant neoplasm of breast: Secondary | ICD-10-CM

## 2019-02-25 DIAGNOSIS — Z131 Encounter for screening for diabetes mellitus: Secondary | ICD-10-CM | POA: Diagnosis not present

## 2019-02-25 DIAGNOSIS — Z0000 Encounter for general adult medical examination without abnormal findings: Secondary | ICD-10-CM | POA: Diagnosis not present

## 2019-02-25 DIAGNOSIS — Z23 Encounter for immunization: Secondary | ICD-10-CM

## 2019-02-25 DIAGNOSIS — Z8739 Personal history of other diseases of the musculoskeletal system and connective tissue: Secondary | ICD-10-CM | POA: Diagnosis not present

## 2019-02-25 DIAGNOSIS — M1A0720 Idiopathic chronic gout, left ankle and foot, without tophus (tophi): Secondary | ICD-10-CM

## 2019-02-25 DIAGNOSIS — Z1211 Encounter for screening for malignant neoplasm of colon: Secondary | ICD-10-CM

## 2019-02-25 DIAGNOSIS — Z13220 Encounter for screening for lipoid disorders: Secondary | ICD-10-CM | POA: Diagnosis not present

## 2019-02-25 LAB — COMPREHENSIVE METABOLIC PANEL
ALT: 23 U/L (ref 0–35)
AST: 15 U/L (ref 0–37)
Albumin: 4 g/dL (ref 3.5–5.2)
Alkaline Phosphatase: 88 U/L (ref 39–117)
BUN: 23 mg/dL (ref 6–23)
CO2: 28 mEq/L (ref 19–32)
Calcium: 9.1 mg/dL (ref 8.4–10.5)
Chloride: 105 mEq/L (ref 96–112)
Creatinine, Ser: 1.09 mg/dL (ref 0.40–1.20)
GFR: 47.84 mL/min — ABNORMAL LOW (ref >60.00)
Glucose, Bld: 80 mg/dL (ref 70–99)
Potassium: 4.1 mEq/L (ref 3.5–5.1)
Sodium: 142 mEq/L (ref 135–145)
Total Bilirubin: 0.7 mg/dL (ref 0.2–1.2)
Total Protein: 6.3 g/dL (ref 6.0–8.3)

## 2019-02-25 LAB — LIPID PANEL
Cholesterol: 149 mg/dL (ref 0–200)
HDL: 55.3 mg/dL (ref >39.00)
LDL Cholesterol: 62 mg/dL (ref 0–99)
NonHDL: 93.56
Total CHOL/HDL Ratio: 3
Triglycerides: 160 mg/dL — ABNORMAL HIGH (ref 0.0–149.0)
VLDL: 32 mg/dL (ref 0.0–40.0)

## 2019-02-25 LAB — URIC ACID: Uric Acid, Serum: 8.3 mg/dL — ABNORMAL HIGH (ref 2.4–7.0)

## 2019-02-25 LAB — HEMOGLOBIN A1C: Hgb A1c MFr Bld: 6.1 % (ref 4.6–6.5)

## 2019-02-25 MED ORDER — ALLOPURINOL 100 MG PO TABS: 100.0000 mg | ORAL_TABLET | Freq: Every day | ORAL | 6 refills | Status: DC

## 2019-02-25 NOTE — Patient Instructions (Addendum)
It was good to see you today- I will be in touch with your labs asap We can have you try allopurinol 100 mg daily for gout prevention- see how this works for you   You got a tetanus vaccine today and we will set you up for Cologuard screening for colon cancer You might also consider getting the shingles vaccine (Shingrix) at the drug store at your convenience    Health Maintenance After Age 83 After age 46, you are at a higher risk for certain long-term diseases and infections as well as injuries from falls. Falls are a major cause of broken bones and head injuries in people who are older than age 72. Getting regular preventive care can help to keep you healthy and well. Preventive care includes getting regular testing and making lifestyle changes as recommended by your health care provider. Talk with your health care provider about:  Which screenings and tests you should have. A screening is a test that checks for a disease when you have no symptoms.  A diet and exercise plan that is right for you. What should I know about screenings and tests to prevent falls? Screening and testing are the best ways to find a health problem early. Early diagnosis and treatment give you the best chance of managing medical conditions that are common after age 6. Certain conditions and lifestyle choices may make you more likely to have a fall. Your health care provider may recommend:  Regular vision checks. Poor vision and conditions such as cataracts can make you more likely to have a fall. If you wear glasses, make sure to get your prescription updated if your vision changes.  Medicine review. Work with your health care provider to regularly review all of the medicines you are taking, including over-the-counter medicines. Ask your health care provider about any side effects that may make you more likely to have a fall. Tell your health care provider if any medicines that you take make you feel dizzy or  sleepy.  Osteoporosis screening. Osteoporosis is a condition that causes the bones to get weaker. This can make the bones weak and cause them to break more easily.  Blood pressure screening. Blood pressure changes and medicines to control blood pressure can make you feel dizzy.  Strength and balance checks. Your health care provider may recommend certain tests to check your strength and balance while standing, walking, or changing positions.  Foot health exam. Foot pain and numbness, as well as not wearing proper footwear, can make you more likely to have a fall.  Depression screening. You may be more likely to have a fall if you have a fear of falling, feel emotionally low, or feel unable to do activities that you used to do.  Alcohol use screening. Using too much alcohol can affect your balance and may make you more likely to have a fall. What actions can I take to lower my risk of falls? General instructions  Talk with your health care provider about your risks for falling. Tell your health care provider if: ? You fall. Be sure to tell your health care provider about all falls, even ones that seem minor. ? You feel dizzy, sleepy, or off-balance.  Take over-the-counter and prescription medicines only as told by your health care provider. These include any supplements.  Eat a healthy diet and maintain a healthy weight. A healthy diet includes low-fat dairy products, low-fat (lean) meats, and fiber from whole grains, beans, and lots of fruits and  vegetables. Home safety  Remove any tripping hazards, such as rugs, cords, and clutter.  Install safety equipment such as grab bars in bathrooms and safety rails on stairs.  Keep rooms and walkways well-lit. Activity   Follow a regular exercise program to stay fit. This will help you maintain your balance. Ask your health care provider what types of exercise are appropriate for you.  If you need a cane or walker, use it as recommended by  your health care provider.  Wear supportive shoes that have nonskid soles. Lifestyle  Do not drink alcohol if your health care provider tells you not to drink.  If you drink alcohol, limit how much you have: ? 0-1 drink a day for women. ? 0-2 drinks a day for men.  Be aware of how much alcohol is in your drink. In the [place] one drink equals one typical bottle of beer (12 oz), one-half glass of wine (5 oz), or one shot of hard liquor (1 oz).  Do not use any products that contain nicotine or tobacco, such as cigarettes and e-cigarettes. If you need help quitting, ask your health care provider. Summary  Having a healthy lifestyle and getting preventive care can help to protect your health and wellness after age 68.  Screening and testing are the best way to find a health problem early and help you avoid having a fall. Early diagnosis and treatment give you the best chance for managing medical conditions that are more common for people who are older than age 65.  Falls are a major cause of broken bones and head injuries in people who are older than age 70. Take precautions to prevent a fall at home.  Work with your health care provider to learn what changes you can make to improve your health and wellness and to prevent falls. This information is not intended to replace advice given to you by your health care provider. Make sure you discuss any questions you have with your health care provider. Document Released: 07/03/2017 Document Revised: 07/03/2017 Document Reviewed: 07/03/2017 Elsevier Interactive Patient Education  2019 [employer].

## 2019-02-27 ENCOUNTER — Encounter: Payer: Self-pay | Admitting: Family Medicine

## 2019-03-02 ENCOUNTER — Encounter: Payer: Self-pay | Admitting: Family Medicine

## 2019-03-02 DIAGNOSIS — M10072 Idiopathic gout, left ankle and foot: Secondary | ICD-10-CM

## 2019-03-02 MED ORDER — COLCHICINE 0.6 MG PO TABS: ORAL_TABLET | ORAL | 0 refills | Status: DC

## 2019-03-04 ENCOUNTER — Encounter: Payer: Self-pay | Admitting: Family Medicine

## 2019-03-04 DIAGNOSIS — Z1211 Encounter for screening for malignant neoplasm of colon: Secondary | ICD-10-CM | POA: Diagnosis not present

## 2019-03-04 DIAGNOSIS — Z1212 Encounter for screening for malignant neoplasm of rectum: Secondary | ICD-10-CM | POA: Diagnosis not present

## 2019-03-11 LAB — COLOGUARD: Cologuard: NEGATIVE

## 2019-03-12 ENCOUNTER — Encounter: Payer: Self-pay | Admitting: Family Medicine

## 2019-03-23 ENCOUNTER — Encounter: Payer: Self-pay | Admitting: Family Medicine

## 2019-03-24 ENCOUNTER — Encounter (HOSPITAL_COMMUNITY): Payer: Self-pay

## 2019-03-24 NOTE — Telephone Encounter (Signed)
Patient coming to pick up samples this afternoon.

## 2019-03-30 DIAGNOSIS — I4891 Unspecified atrial fibrillation: Secondary | ICD-10-CM | POA: Diagnosis not present

## 2019-04-02 ENCOUNTER — Encounter: Payer: Self-pay | Admitting: Family Medicine

## 2019-04-07 NOTE — Telephone Encounter (Signed)
Patient came by the office to drop off forms to be completed by Dr. Lorelei Pont. Placed in provider tray. Please mail when completed.

## 2019-04-09 ENCOUNTER — Encounter: Payer: Self-pay | Admitting: Family Medicine

## 2019-04-09 ENCOUNTER — Other Ambulatory Visit: Payer: Self-pay | Admitting: Family Medicine

## 2019-04-09 DIAGNOSIS — I4891 Unspecified atrial fibrillation: Secondary | ICD-10-CM

## 2019-04-09 MED ORDER — APIXABAN 5 MG PO TABS: 5.0000 mg | ORAL_TABLET | Freq: Two times a day (BID) | ORAL | 3 refills | Status: DC

## 2019-04-18 ENCOUNTER — Encounter: Payer: Self-pay | Admitting: Family Medicine

## 2019-04-20 ENCOUNTER — Encounter: Payer: Self-pay | Admitting: Family Medicine

## 2019-04-21 NOTE — Progress Notes (Signed)
[place] at [facility] [address], [place], [place] [address] [phone]  Date:  04/23/2019   Name:  Wendy Hebert   DOB:  1932-09-07   MRN:  [id number]  PCP:  Darreld Mclean, MD    Chief Complaint: Ear Wax Removal (right ear)   History of Present Illness:  Wendy Hebert is a 85 y.o. very pleasant female patient who presents with the following:  Patient with history of breast cancer and atrial fibrillation diagnosed this past January.  Here today with concern of ear wax that needs removal She has noticed more difficulty hearing out of her right ear, it can be itchy sometimes.  It is not painful The left ear seems to be okay She also notes more seborrheic keratoses on her chest and forehead, she would like me to freeze these today for react time  Patient Active Problem List   Diagnosis Date Noted  . Malignant neoplasm of upper-inner quadrant of right breast in female, estrogen receptor positive ([place]) 02/16/2019  . Atrial fibrillation ([place]) 10/01/2018  . Gait difficulty 03/20/2018  . Lichen sclerosus 00/86/7619  . History of right breast cancer 03/20/2018    Past Medical History:  Diagnosis Date  . Arthritis   . Cancer ([place]) 2017   1.6 lumpectomy  . Lichen sclerosus     Past Surgical History:  Procedure Laterality Date  . APPENDECTOMY    . BOWEL RESECTION    . BREAST LUMPECTOMY  2017  . CARDIOVERSION N/A 11/10/2018   Procedure: CARDIOVERSION;  Surgeon: [name], MD;  Location: [facility];  Service: Cardiovascular;  Laterality: N/A;  . CESAREAN SECTION    . HERNIA REPAIR    . TONSILLECTOMY      Social History   Tobacco Use  . Smoking status: Former Research scientist (life sciences)  . Smokeless tobacco: Never Used  . Tobacco comment: 30+ years  Substance Use Topics  . Alcohol use: Yes    Comment: occasionally  . Drug use: Never    Family History  Problem Relation Age of Onset  . Cancer Father        Lung  .  Cancer Sister     No Known Allergies  Medication list has been reviewed and updated.  Current Outpatient Medications on File Prior to Visit  Medication Sig Dispense Refill  . apixaban (ELIQUIS) 5 MG TABS tablet Take 1 tablet (5 mg total) by mouth 2 (two) times daily. 180 tablet 3  . Calcium Carb-Cholecalciferol (CALCIUM 600 + D PO) Take 1 tablet by mouth every evening.    . clobetasol cream (TEMOVATE) 5.09 % Apply 1 application topically daily.    . metoprolol tartrate (LOPRESSOR) 25 MG tablet Take 1.5tab in the AM and 1 tab in the PM 50 tablet 0  . Multiple Vitamin (MULTIVITAMIN WITH MINERALS) TABS tablet Take 1 tablet by mouth every evening.    . Multiple Vitamins-Minerals (PRESERVISION AREDS 2) CAPS Take 2 capsules by mouth every evening.    . Probiotic CAPS Take 1 capsule by mouth every evening.    . vitamin C (ASCORBIC ACID) 500 MG tablet Take 500 mg by mouth daily.     No current facility-administered medications on file prior to visit.     Review of Systems:  As per HPI- otherwise negative.   Physical Examination: Vitals:   04/23/19 1103  BP: 122/68  Pulse: 88  Resp: 16  Temp: 97.8 F (36.6 C)  SpO2: 98%  Vitals:   04/23/19 1103  Weight: 139 lb (63 kg)  Height: 5' 1.5" (1.562 m)   Body mass index is 25.84 kg/m. Ideal Body Weight: Weight in (lb) to have BMI = 25: 134.2  GEN: WDWN, NAD, Non-toxic, A & O x 3, normal weight, looks well HEENT: Atraumatic, Normocephalic. Neck supple. No masses, No LAD.  Left ear canal is normal Right ear canal is occluded with wax Ears and Nose: No external deformity. CV: Rate controlled A. fib no M/G/R. No JVD. No thrill. No extra heart sounds. PULM: CTA B, no wheezes, crackles, rhonchi. No retractions. No resp. distress. No accessory muscle use. EXTR: No c/c/e NEURO Normal gait for patient, does use a cane PSYCH: Normally interactive. Conversant. Not depressed or anxious appearing.  Calm demeanor.   Verbal consent obtained  for procedures today We spent  over 30 minutes today irrigating her right ear and  gradually removing a large amount of impacted wax.  Patient tolerated procedure well, no blood loss and no pain I did most of the irrigation personally myself Following irrigation her ear canal appears normal with a small amount of wax still retained, TM intact  We also applied cryotherapy x3-2 seborrheic keratoses on her chest, and 3 on her forehead. Again patient tolerated well, no pain and no blood loss  No immediate complications to procedures as above   Assessment and Plan:   ICD-10-CM   1. Impacted cerumen of right ear  H61.21   2. Seborrheic keratoses  L82.1    Irrigated right ear and removed cerumen today. We were not able to get the ear canal 100% clean, but I felt that we should stop prior to onset of irritation or injury.  Patient's hearing is improved, I asked her to come and see me in a month or so if she is not satisfied with her result  Cryotherapy as above  Follow-up: No follow-ups on file.  No orders of the defined types were placed in this encounter.  No orders of the defined types were placed in this encounter.   @SIGN @    Signed Lamar Blinks, MD

## 2019-04-23 ENCOUNTER — Ambulatory Visit (INDEPENDENT_AMBULATORY_CARE_PROVIDER_SITE_OTHER): Payer: Medicare Other | Admitting: Family Medicine

## 2019-04-23 ENCOUNTER — Encounter: Payer: Self-pay | Admitting: Family Medicine

## 2019-04-23 ENCOUNTER — Other Ambulatory Visit: Payer: Self-pay

## 2019-04-23 VITALS — BP 122/68 | HR 88 | Temp 97.8°F | Resp 16 | Ht 61.5 in | Wt 139.0 lb

## 2019-04-23 DIAGNOSIS — H6121 Impacted cerumen, right ear: Secondary | ICD-10-CM | POA: Diagnosis not present

## 2019-04-23 DIAGNOSIS — L821 Other seborrheic keratosis: Secondary | ICD-10-CM | POA: Diagnosis not present

## 2019-04-23 NOTE — Patient Instructions (Signed)
We removed wax from your right ear and also froze some skin lesions today You might try Debrox OTC wax softening drops If you feel like your ear is not quite right in a month or two come in and I will re-do your irrigation

## 2019-05-15 ENCOUNTER — Other Ambulatory Visit: Payer: Self-pay

## 2019-05-15 DIAGNOSIS — Z20822 Contact with and (suspected) exposure to covid-19: Secondary | ICD-10-CM

## 2019-05-16 LAB — NOVEL CORONAVIRUS, NAA: SARS-CoV-2, NAA: NOT DETECTED

## 2019-05-19 NOTE — Progress Notes (Addendum)
[place] at [facility] [address], [place], [place] [address] [phone]  Date:  05/20/2019   Name:  Wendy Hebert   DOB:  1933/05/25   MRN:  [id number]  PCP:  Darreld Mclean, MD    Chief Complaint: Hearing Loss   History of Present Illness:  Wendy Hebert is a 85 y.o. very pleasant female patient who presents with the following:  Patient with history of breast cancer and atrial fibrillation.  Here today with concern about earwax Seen by myself on August 20 at which time we spent quite a bit of time removing earwax from her right ear She is here today with concern of still not being able to hear from her right ear- she got better after wax removal last month, but then her sx returned just a few days ago She tried putting some debrox in her ear- did not seem to help-may have made it worse It does itch- this is not a new thing however She uses a bit of topical hydrocortisone as needed  She feels that her hearing is not as good as it should be from both the right and left ears.  Her husband also has complained that she does not hear very well She is not using hearing aids as of yet  Flu shot- pt prefers to do in October  Pneumonia- per pt this series is complete already   Patient Active Problem List   Diagnosis Date Noted  . Malignant neoplasm of upper-inner quadrant of right breast in female, estrogen receptor positive ([place]) 02/16/2019  . Atrial fibrillation ([place]) 10/01/2018  . Gait difficulty 03/20/2018  . Lichen sclerosus [id number]  . History of right breast cancer 03/20/2018    Past Medical History:  Diagnosis Date  . Arthritis   . Cancer ([place]) 2017   1.6 lumpectomy  . Lichen sclerosus     Past Surgical History:  Procedure Laterality Date  . APPENDECTOMY    . BOWEL RESECTION    . BREAST LUMPECTOMY  2017  . CARDIOVERSION N/A 11/10/2018   Procedure: CARDIOVERSION;  Surgeon: [name], MD;   Location: [facility];  Service: Cardiovascular;  Laterality: N/A;  . CESAREAN SECTION    . HERNIA REPAIR    . TONSILLECTOMY      Social History   Tobacco Use  . Smoking status: Former Research scientist (life sciences)  . Smokeless tobacco: Never Used  . Tobacco comment: 30+ years  Substance Use Topics  . Alcohol use: Yes    Comment: occasionally  . Drug use: Never    Family History  Problem Relation Age of Onset  . Cancer Father        Lung  . Cancer Sister     No Known Allergies  Medication list has been reviewed and updated.  Current Outpatient Medications on File Prior to Visit  Medication Sig Dispense Refill  . apixaban (ELIQUIS) 5 MG TABS tablet Take 1 tablet (5 mg total) by mouth 2 (two) times daily. 180 tablet 3  . Calcium Carb-Cholecalciferol (CALCIUM 600 + D PO) Take 1 tablet by mouth every evening.    . clobetasol cream (TEMOVATE) [id number] % Apply 1 application topically daily.    . metoprolol tartrate (LOPRESSOR) 25 MG tablet Take 1.5tab in the AM and 1 tab in the PM 50 tablet 0  . Multiple Vitamin (MULTIVITAMIN WITH MINERALS) TABS tablet Take 1 tablet by mouth every evening.    . Multiple Vitamins-Minerals (  PRESERVISION AREDS 2) CAPS Take 2 capsules by mouth every evening.    . Probiotic CAPS Take 1 capsule by mouth every evening.    . vitamin C (ASCORBIC ACID) 500 MG tablet Take 500 mg by mouth daily.     No current facility-administered medications on file prior to visit.     Review of Systems:  As per HPI- otherwise negative. Otherwise feeling well, no cough, fever, chest pain, shortness of breath She would like me to freeze a couple of areas on her upper body, as she often does  Physical Examination: Vitals:   05/20/19 1339  BP: 118/80  Pulse: 81  Resp: 16  Temp: (!) 96.3 F (35.7 C)  SpO2: 98%   Vitals:   05/20/19 1339  Weight: 139 lb (63 kg)  Height: 5' 1.5" (1.562 m)   Body mass index is 25.84 kg/m. Ideal Body Weight: Weight in (lb) to have BMI = 25:  134.2  GEN: WDWN, NAD, Non-toxic, A & O x 3, minimal overweight, looks well HEENT: Atraumatic, Normocephalic. Neck supple. No masses, No LAD. Ears and Nose: No external deformity. CV: RRR, No M/G/R. No JVD. No thrill. No extra heart sounds. PULM: CTA B, no wheezes, crackles, rhonchi. No retractions. No resp. distress. No accessory muscle use. ABD: S, NT, ND, +BS. No rebound. No HSM. EXTR: No c/c/e NEURO Normal gait.  PSYCH: Normally interactive. Conversant. Not depressed or anxious appearing.  Calm demeanor.  There is some soft earwax in the external right canal.  This was irrigated and removed to the best of my ability. It was not impacted.  The right TM is still not very clearly visible, there appears to be scarring and/or adherent cerumen.  Patient does not describe any pain or bleeding, so I do not think she has ruptured her TM. Left TM appears normal  Verbal consent obtained for liquid nitrogen cryotherapy Froze 3 possible AK's on anterior chest, and 2 seborrheic keratoses on her back Patient tolerated well, no complications   Assessment and Plan: Hearing loss of right ear, unspecified hearing loss type - Plan: Ambulatory referral to ENT  Impacted cerumen of right ear  Seborrheic keratoses  Actinic keratosis  Here today to follow-up on right ear hearing loss thought to be earwax.  I removed as much wax as I could, suspect she may simply have some hearing loss.  However as her right TM is not completely normal we will refer her to ENT for evaluation and possible audiometric testing Cryotherapy to benign or premalignant skin lesions as above seb K removed for therapeutic reason as the patient is bothered by appearance and feel of these lesions, and they may rub on her clothing/ bra  Signed Lamar Blinks, MD

## 2019-05-19 NOTE — Patient Instructions (Addendum)
It was good to see you again today as always!    I will set you up to see an ENT doctor in [facility] to test your hearing and look in your ears I will watch for your mammogram report Be sure to get your flu shot later on this fall

## 2019-05-20 ENCOUNTER — Ambulatory Visit (INDEPENDENT_AMBULATORY_CARE_PROVIDER_SITE_OTHER): Payer: Medicare Other | Admitting: Family Medicine

## 2019-05-20 ENCOUNTER — Other Ambulatory Visit: Payer: Self-pay

## 2019-05-20 ENCOUNTER — Encounter: Payer: Self-pay | Admitting: Family Medicine

## 2019-05-20 VITALS — BP 118/80 | HR 81 | Temp 96.3°F | Resp 16 | Ht 61.5 in | Wt 139.0 lb

## 2019-05-20 DIAGNOSIS — L821 Other seborrheic keratosis: Secondary | ICD-10-CM | POA: Diagnosis not present

## 2019-05-20 DIAGNOSIS — H6121 Impacted cerumen, right ear: Secondary | ICD-10-CM | POA: Diagnosis not present

## 2019-05-20 DIAGNOSIS — H9191 Unspecified hearing loss, right ear: Secondary | ICD-10-CM | POA: Diagnosis not present

## 2019-05-20 DIAGNOSIS — L989 Disorder of the skin and subcutaneous tissue, unspecified: Secondary | ICD-10-CM | POA: Diagnosis not present

## 2019-05-20 DIAGNOSIS — L570 Actinic keratosis: Secondary | ICD-10-CM

## 2019-05-21 ENCOUNTER — Encounter: Payer: Self-pay | Admitting: Family Medicine

## 2019-05-22 NOTE — Telephone Encounter (Signed)
Which pneumonia shot did she get in 1988? Would it be 13 or 23?

## 2019-05-28 ENCOUNTER — Encounter: Payer: Self-pay | Admitting: Family Medicine

## 2019-05-29 ENCOUNTER — Telehealth: Payer: Self-pay

## 2019-05-29 NOTE — Telephone Encounter (Signed)
Copied from [place] [id number]. Topic: General - Other >> May 29, 2019  2:37 PM Leward Quan A wrote: Reason for CRM: Patient called to say that she will not be able to see ENT until October so asking Dr Lorelei Pont if she have one in [place] that could see her before 06/03/2019. Please call patient at Ph# [phone]

## 2019-05-29 NOTE — Telephone Encounter (Signed)
Gwen can you check on this?

## 2019-05-30 ENCOUNTER — Encounter: Payer: Self-pay | Admitting: Family Medicine

## 2019-06-01 NOTE — Telephone Encounter (Signed)
Taken care of

## 2019-06-03 DIAGNOSIS — Z87891 Personal history of nicotine dependence: Secondary | ICD-10-CM | POA: Diagnosis not present

## 2019-06-03 DIAGNOSIS — H6121 Impacted cerumen, right ear: Secondary | ICD-10-CM | POA: Diagnosis not present

## 2019-06-03 DIAGNOSIS — H61891 Other specified disorders of right external ear: Secondary | ICD-10-CM | POA: Diagnosis not present

## 2019-06-08 ENCOUNTER — Encounter: Payer: Self-pay | Admitting: Family Medicine

## 2019-06-09 ENCOUNTER — Telehealth: Payer: Self-pay

## 2019-06-09 ENCOUNTER — Encounter: Payer: Self-pay | Admitting: Family Medicine

## 2019-06-09 DIAGNOSIS — I4891 Unspecified atrial fibrillation: Secondary | ICD-10-CM

## 2019-06-09 MED ORDER — APIXABAN 5 MG PO TABS: 5.0000 mg | ORAL_TABLET | Freq: Two times a day (BID) | ORAL | 6 refills | Status: DC

## 2019-06-09 NOTE — Telephone Encounter (Signed)
Copied from [place] [id number]. Topic: General - Other >> Jun 09, 2019  1:18 PM Pauline Good wrote: Reason for CRM: pt want to let Dr Lorelei Pont she take 1.5 morning and 1 in evening for her Eliquis. Please call to advise., pt was speaking to Dr Lorelei Pont via [place] but her computer is having problems. Please call in to [facility] [phone]

## 2019-06-09 NOTE — Telephone Encounter (Signed)
Called pt to discuss her eliquis I think she is supposed to be taking 5 mg twice a day, not sure why she is taking 1.5 in the am I called her but did not reach.  mychart message to pt

## 2019-06-10 NOTE — Telephone Encounter (Signed)
Called her back- she notes an episode of confusion which occurred 4 days ago. She was shopping and gave the clerk her medication card instead of her debit card. She is now feeling back to normal but is a bit concerned Scheduled her to see me tomorrow for a TIA eval

## 2019-06-11 ENCOUNTER — Other Ambulatory Visit: Payer: Self-pay

## 2019-06-11 ENCOUNTER — Encounter: Payer: Self-pay | Admitting: Family Medicine

## 2019-06-11 ENCOUNTER — Ambulatory Visit (INDEPENDENT_AMBULATORY_CARE_PROVIDER_SITE_OTHER): Payer: Medicare Other | Admitting: Family Medicine

## 2019-06-11 VITALS — BP 108/62 | HR 87 | Temp 96.4°F | Resp 16 | Ht 61.5 in | Wt 140.0 lb

## 2019-06-11 DIAGNOSIS — R410 Disorientation, unspecified: Secondary | ICD-10-CM

## 2019-06-11 MED ORDER — SIMVASTATIN 10 MG PO TABS: 10.0000 mg | ORAL_TABLET | Freq: Every day | ORAL | 3 refills | Status: DC

## 2019-06-11 NOTE — Progress Notes (Signed)
[place] at [facility] [address], [place], [place] [address] [phone]  Date:  06/11/2019   Name:  Wendy Hebert   DOB:  05-10-1933   MRN:  [id number]  PCP:  Darreld Mclean, MD    Chief Complaint: Discuss MRI   History of Present Illness:  Wendy Hebert is a 85 y.o. very pleasant female patient who presents with the following:  Patient with history of atrial fibrillation and breast cancer She takes Eliquis, metoprolol  Today is Thursday.  This past Sunday, she had an episode where she felt she was not thinking clearly.   We spoke yesterday and made appointment for today to discuss possible TIA. She was at a grocery store and got confused between her medication card and her credit card. She was able to figure it out and paid her bill. She also got confused about her medication dosages when we communicate earlier this week No other special instances of memory loss, she has not gotten lost in familiar area or anything else glaring.  She is not sure if anything is actually wrong, or if she is just aging. No speaking or swallowing issues, no numbness or weakness of her body.  No evidence of a stroke No unusual headaches  She is already anticoagulated on Eliquis for history of atrial fibrillation She is not however taking a statin Discussed possibility of TIA, family is concerned about this and would like to look further Consider MRI and MRA of her brain to start, carotid ultrasound  She saw cardiology in July of this year- they had her continue metoprolol and eliquis   Spoke with radiology.  An MRI brain and MRA head without contrast is indicated.  Patient Active Problem List   Diagnosis Date Noted  . Malignant neoplasm of upper-inner quadrant of right breast in female, estrogen receptor positive ([place]) 02/16/2019  . Atrial fibrillation ([place]) 10/01/2018  . Gait difficulty 03/20/2018  . Lichen sclerosus [id number]  .  History of right breast cancer 03/20/2018    Past Medical History:  Diagnosis Date  . Arthritis   . Cancer ([place]) 2017   1.6 lumpectomy  . Lichen sclerosus     Past Surgical History:  Procedure Laterality Date  . APPENDECTOMY    . BOWEL RESECTION    . BREAST LUMPECTOMY  2017  . CARDIOVERSION N/A 11/10/2018   Procedure: CARDIOVERSION;  Surgeon: [name], MD;  Location: [facility];  Service: Cardiovascular;  Laterality: N/A;  . CESAREAN SECTION    . HERNIA REPAIR    . TONSILLECTOMY      Social History   Tobacco Use  . Smoking status: Former Research scientist (life sciences)  . Smokeless tobacco: Never Used  . Tobacco comment: 30+ years  Substance Use Topics  . Alcohol use: Yes    Comment: occasionally  . Drug use: Never    Family History  Problem Relation Age of Onset  . Cancer Father        Lung  . Cancer Sister     No Known Allergies  Medication list has been reviewed and updated.  Current Outpatient Medications on File Prior to Visit  Medication Sig Dispense Refill  . apixaban (ELIQUIS) 5 MG TABS tablet Take 1 tablet (5 mg total) by mouth 2 (two) times daily. 60 tablet 6  . Calcium Carb-Cholecalciferol (CALCIUM 600 + D PO) Take 1 tablet by mouth every evening.    . clobetasol cream (TEMOVATE) 0.05 %  Apply 1 application topically daily.    . metoprolol tartrate (LOPRESSOR) 25 MG tablet Take 1.5tab in the AM and 1 tab in the PM 50 tablet 0  . Multiple Vitamin (MULTIVITAMIN WITH MINERALS) TABS tablet Take 1 tablet by mouth every evening.    . Multiple Vitamins-Minerals (PRESERVISION AREDS 2) CAPS Take 2 capsules by mouth every evening.    . Probiotic CAPS Take 1 capsule by mouth every evening.    . TURMERIC PO Take by mouth.    . vitamin C (ASCORBIC ACID) 500 MG tablet Take 500 mg by mouth daily.     No current facility-administered medications on file prior to visit.     Review of Systems:  As per HPI- otherwise negative.   Physical Examination: Vitals:   06/11/19 1345   BP: 108/62  Pulse: 87  Resp: 16  Temp: (!) 96.4 F (35.8 C)  SpO2: 98%   Vitals:   06/11/19 1345  Weight: 140 lb (63.5 kg)  Height: 5' 1.5" (1.562 m)   Body mass index is 26.02 kg/m. Ideal Body Weight: Weight in (lb) to have BMI = 25: 134.2  GEN: WDWN, NAD, Non-toxic, A & O x 3, looks well, mild overweight  HEENT: Atraumatic, Normocephalic. Neck supple. No masses, No LAD. Bilateral TM wnl, oropharynx normal.  PEERL,EOMI.   Ears and Nose: No external deformity. [id number] controlled a fib, No M/G/R. No JVD. No thrill. No extra heart sounds. PULM: CTA B, no wheezes, crackles, rhonchi. No retractions. No resp. distress. No accessory muscle use. ABD: S, NT, ND, +BS. No rebound. No HSM. EXTR: No c/c/e NEURO Normal gait for patient, she walks somewhat slowly which is typical for her age Normal strength, sensation, DTR of all extremities.  Normal movement and sensation of face.  Negative Romberg PSYCH: Normally interactive. Conversant. Not depressed or anxious appearing.  Calm demeanor.   Patient asked me to use cryotherapy for 3 seborrheic keratoses on her chest today.  Treated with liquid nitrogen x3 each Patient tolerated without any complication Assessment and Plan: Confusion with non-focal neuro exam - Plan: simvastatin (ZOCOR) 10 MG tablet, MR Angiogram Head Wo Contrast, MR Brain Wo Contrast, CANCELED: MR Brain Wo Contrast, CANCELED: MR Angiogram Head Wo Contrast  Wendy Hebert is here today with concern of possible TIA.  She has noted a few episodes of mild confusion, could be within the realm of normal but she is concerned.  She would like to do an MRI work-up for possible TIA, I will order this for her.  In the meantime we will have her start on simvastatin, continue her Eliquis and blood pressure control  She will let me know if any other unusual episodes or concerns If MRI is nonfocal, plan for possible neurology consultation  Signed Lamar Blinks, MD

## 2019-06-11 NOTE — Patient Instructions (Addendum)
Good to see you again today!  I will set you up for an MRI of your brain and the blood vessels in your brain to look for any evidence of a TIA.  I am not sure if what you experienced was a TIA, or just "age related" memory change  Continue eliquis, metoprolol  Let's start a cholesterol medication to help reduce risk of any possible future TIA I sent in an rx for simvastatin 10 mg to add to your regimen.  Let me know if any trouble tolerating this medication   Please let me know if any further episodes occur

## 2019-06-12 ENCOUNTER — Other Ambulatory Visit (HOSPITAL_COMMUNITY): Payer: Self-pay | Admitting: Nurse Practitioner

## 2019-06-12 DIAGNOSIS — I4891 Unspecified atrial fibrillation: Secondary | ICD-10-CM

## 2019-06-15 ENCOUNTER — Encounter: Payer: Self-pay | Admitting: Family Medicine

## 2019-06-15 NOTE — Telephone Encounter (Signed)
Called her back- she awoke with left shoulder pain this am- was not sure why it hurt.  Doing better now  It does not hurt sitting or standing up No CP or SOB She thinks that she is ok

## 2019-06-18 ENCOUNTER — Encounter: Payer: Self-pay | Admitting: Family Medicine

## 2019-06-25 ENCOUNTER — Encounter: Payer: Self-pay | Admitting: Family Medicine

## 2019-06-25 ENCOUNTER — Ambulatory Visit
Admission: RE | Admit: 2019-06-25 | Discharge: 2019-06-25 | Disposition: A | Discharge status: Home / Self Care | Payer: Medicare Other | Source: Ambulatory Visit | Attending: Family Medicine | Admitting: Family Medicine

## 2019-06-25 DIAGNOSIS — R410 Disorientation, unspecified: Secondary | ICD-10-CM

## 2019-06-25 DIAGNOSIS — G459 Transient cerebral ischemic attack, unspecified: Secondary | ICD-10-CM | POA: Diagnosis not present

## 2019-07-02 DIAGNOSIS — I4891 Unspecified atrial fibrillation: Secondary | ICD-10-CM | POA: Diagnosis not present

## 2019-07-04 ENCOUNTER — Other Ambulatory Visit: Payer: Medicare Other

## 2019-07-28 ENCOUNTER — Encounter: Payer: Self-pay | Admitting: Family Medicine

## 2019-07-28 MED ORDER — NYSTATIN-TRIAMCINOLONE 100000-0.1 UNIT/GM-% EX CREA: 1.0000 "application " | TOPICAL_CREAM | Freq: Two times a day (BID) | CUTANEOUS | 1 refills | Status: DC

## 2019-08-04 ENCOUNTER — Encounter: Payer: Self-pay | Admitting: Family Medicine

## 2019-08-04 DIAGNOSIS — I4891 Unspecified atrial fibrillation: Secondary | ICD-10-CM

## 2019-08-04 DIAGNOSIS — Z853 Personal history of malignant neoplasm of breast: Secondary | ICD-10-CM | POA: Diagnosis not present

## 2019-08-04 DIAGNOSIS — R928 Other abnormal and inconclusive findings on diagnostic imaging of breast: Secondary | ICD-10-CM | POA: Diagnosis not present

## 2019-08-04 MED ORDER — METOPROLOL TARTRATE 25 MG PO TABS: ORAL_TABLET | ORAL | 3 refills | Status: DC

## 2019-08-12 ENCOUNTER — Telehealth: Payer: Self-pay | Admitting: Oncology

## 2019-08-12 NOTE — Telephone Encounter (Signed)
Called patient to schedule a follow-up visit, per patient's request 12/22 appointment has been made.

## 2019-08-14 ENCOUNTER — Encounter: Payer: Self-pay | Admitting: Family Medicine

## 2019-08-24 NOTE — Progress Notes (Signed)
[place]  [phone] [phone]    ID: Wendy Hebert DOB: 02-Apr-1933  MR#: [id number]  MBW#:[id number]  Patient Care Team: Darreld Mclean, MD as PCP - General (Family Medicine) Wendy Needs, NP as Nurse Practitioner (Nurse Practitioner) Wendy Hebert, Wendy Dad, MD as Consulting Physician (Oncology) Wendy Charon, PA-C as Physician Assistant (Obstetrics and Gynecology) Wendy Panning, MD as Referring Physician (Oncology) Wendy Medicus, MD as Consulting Physician (Internal Medicine) Wendy Pace, MD as Consulting Physician (Surgical Oncology) OTHER MD:   CHIEF COMPLAINT: Estrogen receptor positive breast cancer  CURRENT TREATMENT: Observation   INTERVAL HISTORY: Wendy Hebert returns today for follow up of her estrogen receptor positive breast cancer.  She continues under observation alone.  Last mammogram I have records of is from [facility] 07/15/2018.  It showed the breast density to be category B.  There was no evidence of malignancy.  However she tells me she had mammography in [facility] last week and that she was told everything was "fine".  Since her last visit, she underwent Cologuard on 03/04/2019, which was negative.  She also underwent coronavirus testing on 05/15/2019, which was negative.  She experienced a TIA in 06/2019. She underwent brain MRI and angio head MRI on 06/25/2019, which showed no acute or subacute findings.   REVIEW OF SYSTEMS: Wendy Hebert feels her life was ruined by taking antiestrogens.  She thinks this is the reason for her decreased mobility.  She mostly uses a walker now, but recall she fell in January and fractured some ribs.  Since she started using a walker she has not had any other falls she says.  She loves to play bridge but has not been able to because of the pandemic.  At home she does some cleaning and most of the cooking but her husband also helps.  A detailed review of systems today was otherwise  noncontributory   HISTORY OF CURRENT ILLNESS: Wendy Hebert was referred by Wendy Hebert for continuing breast cancer surveillance.   The patient originally was found to have a right breast abnormality on screening mammography August 2017.  Additional views confirmed a focal distortion in the inner right breast and ultrasound found a 1.4 cm irregular hypoechoic mass at the 3 o'clock position 5 cm from the nipple. There were no abnormal right axillary lymph nodes noted.   Biopsy under Dr. Genoveva Hebert 05/11/2016 showed ([id number] [id number]) invasive ductal carcinoma, grade 2, estrogen receptor and progesterone receptor both 99% positive with strong staining intensity, HER-2 equivocal at 2+, with FISH attempts failed x2.  The patient then proceeded to lumpectomy and sentinel lymph node biopsy ([id number] [id number]) on 05/24/2016 for a 1.4 cm invasive ductal carcinoma, with negative although very close (less than 1 mm) margins, and with no HER-2 amplification, signals ratio being 1.0 and number per cell 1.6.  A single sentinel lymph node was removed.  Oncotype recurrence score of 12 predicted no benefit from adjuvant chemotherapy.  The patient did not receive adjuvant radiation--she does not recall it being offered.  Adjuvantly she was treated with anastrozole and letrozole but had significant arthralgias and myalgias with and after switching to tamoxifen she developed a bothersome aginal discharge.  Accordingly she is being followed with observation alone  She had mammography at [facility] 07/15/2018 showing the breast density to be category B.  They noted lumpectomy changes in the medial right breast but no evidence of malignancy.  Note also a right breast ultrasound on 06/27/2017 for evaluation of a new  erythematous lump on her right breast lumpectomy scar.  This showed mild skin thickening consistent with inflammation such as a suture granuloma or keloid formation.  There was no discrete mass and the  underlying fibroglandular tissue was unremarkable.  The patient's subsequent history is as detailed below.    PAST MEDICAL HISTORY: Past Medical History:  Diagnosis Date  . Arthritis   . Cancer ([place]) 2017   1.6 lumpectomy  . Lichen sclerosus   Gout   PAST SURGICAL HISTORY: Past Surgical History:  Procedure Laterality Date  . APPENDECTOMY    . BOWEL RESECTION    . BREAST LUMPECTOMY  2017  . CARDIOVERSION N/A 11/10/2018   Procedure: CARDIOVERSION;  Surgeon: Wendy Latch, MD;  Location: [facility];  Service: Cardiovascular;  Laterality: N/A;  . CESAREAN SECTION    . HERNIA REPAIR    . TONSILLECTOMY      FAMILY HISTORY: Family History  Problem Relation Age of Onset  . Cancer Hebert        Lung  . Cancer Sister    Wendy Hebert died from lung cancer at age 20. Patients' mother died from chronic leukemia at age 28. The patient has 1 sister. Her sister was diagnosed with breast cancer in her 40s. Patient denies anyone in her family having ovarian, prostate, or pancreatic cancer.    GYNECOLOGIC HISTORY:  No LMP recorded. Patient is postmenopausal. Menarche: 85 years old Age at first live birth: 85 years old Pierce P: 1 LMP: mid 18's Contraceptive:  HRT: yes, several years  Hysterectomy?: no BSO?: no   SOCIAL HISTORY: (Current as of 02/16/2019) Wendy Hebert retired from the [place] [employer]. She is at home with her husband, Wendy Hebert who is a former bookkeeper.  There son, is Wendy Hebert (goes by Wendy Hebert), is a Scientist, water quality in [place]. [name] has a grandson who is 45 years old as of 02/2019. She belongs to a [employer].    ADVANCED DIRECTIVES: In the absence of any documentation, Wendy Hebert's spouse, Wendy Hebert, is her healthcare power of attorney.      HEALTH MAINTENANCE: Social History   Tobacco Use  . Smoking status: Former Research scientist (life sciences)  . Smokeless tobacco: Never Used  . Tobacco comment: 30+ years  Substance Use Topics  . Alcohol use: Yes    Comment: occasionally  . Drug use: Never     Colonoscopy: Cologuard 03/2019, negative  PAP: 09/17/2018  Bone density: osteopenic 07/2016 Mammography:   No Known Allergies  Current Outpatient Medications  Medication Sig Dispense Refill  . apixaban (ELIQUIS) 5 MG TABS tablet Take 1 tablet (5 mg total) by mouth 2 (two) times daily. 60 tablet 6  . Calcium Carb-Cholecalciferol (CALCIUM 600 + D PO) Take 1 tablet by mouth every evening.    . clobetasol cream (TEMOVATE) 9.41 % Apply 1 application topically daily.    . metoprolol tartrate (LOPRESSOR) 25 MG tablet TAKE 1 AND 1/2 TABLETS BY  MOUTH EVERY MORNING AND 1  TABLET BY MOUTH IN THE  EVENING 225 tablet 3  . Multiple Vitamin (MULTIVITAMIN WITH MINERALS) TABS tablet Take 1 tablet by mouth every evening.    . Multiple Vitamins-Minerals (PRESERVISION AREDS 2) CAPS Take 2 capsules by mouth every evening.    . nystatin-triamcinolone (MYCOLOG II) cream Apply 1 application topically 2 (two) times daily. Use as needed for fungal infection 60 g 1  . Probiotic CAPS Take 1 capsule by mouth every evening.    . simvastatin (ZOCOR) 10 MG tablet Take 1 tablet (10 mg total)  by mouth at bedtime. 90 tablet 3  . TURMERIC PO Take by mouth.    . vitamin C (ASCORBIC ACID) 500 MG tablet Take 500 mg by mouth daily.     No current facility-administered medications for this visit.     OBJECTIVE: Older white woman walking with a cane  Vitals:   08/25/19 1519  BP: 139/83  Pulse: 60  Resp: 18  Temp: 98.3 F (36.8 C)     Body mass index is 26.56 kg/m.   Wt Readings from Last 3 Encounters:  08/25/19 142 lb 14.4 oz (64.8 kg)  06/11/19 140 lb (63.5 kg)  05/20/19 139 lb (63 kg)      ECOG FS:2 - Symptomatic, <50% confined to bed  Sclerae unicteric, EOMs intact Wearing a mask No cervical or supraclavicular adenopathy Lungs no rales or rhonchi Heart regular rate and rhythm Abd soft, nontender, positive bowel sounds MSK no focal spinal tenderness, no upper extremity lymphedema Neuro: nonfocal,  well oriented, appropriate affect Breasts: The right breast is status post lumpectomy.  There is a lesion lateral to the areola which is imaged below.  It does not appear to have significantly changed over the last year.  The left breast is benign.  Both axillae are benign.  Right breast 08/25/2019    Right breast 02/16/2019     LAB RESULTS:  CMP     Component Value Date/Time   NA 142 02/25/2019 1011   NA 141 07/03/2018 0000   K 4.1 02/25/2019 1011   CL 105 02/25/2019 1011   CL 102 07/03/2018 0000   CO2 28 02/25/2019 1011   CO2 23 07/03/2018 0000   GLUCOSE 80 02/25/2019 1011   BUN 23 02/25/2019 1011   BUN 16 07/03/2018 0000   CREATININE 1.09 02/25/2019 1011   CALCIUM 9.1 02/25/2019 1011   CALCIUM 9.3 07/03/2018 0000   PROT 6.3 02/25/2019 1011   ALBUMIN 4.0 02/25/2019 1011   AST 15 02/25/2019 1011   ALT 23 02/25/2019 1011   ALKPHOS 88 02/25/2019 1011   BILITOT 0.7 02/25/2019 1011   GFRNONAA 49 (L) 11/10/2018 0858   GFRNONAA 47 07/03/2018 0000   GFRAA 57 (L) 11/10/2018 0858    No results found for: TOTALPROTELP, ALBUMINELP, A1GS, A2GS, BETS, BETA2SER, GAMS, MSPIKE, SPEI  No results found for: KPAFRELGTCHN, LAMBDASER, KAPLAMBRATIO  Lab Results  Component Value Date   WBC 8.4 11/10/2018   NEUTROABS 6.8 10/17/2018   HGB 14.8 11/10/2018   HCT 45.2 11/10/2018   MCV 95.0 11/10/2018   PLT 305 11/10/2018    No results found for: LABCA2  No components found for: YQIHKV425  No results for input(s): INR in the last 168 hours.  No results found for: LABCA2  No results found for: ZDG387  No results found for: FIE332  No results found for: RJJ884  No results found for: CA2729  No components found for: HGQUANT  No results found for: CEA1 / No results found for: CEA1   No results found for: AFPTUMOR  No results found for: CHROMOGRNA  No results found for: PSA1  No visits with results within 3 Day(s) from this visit.  Latest known visit with results is:   Orders Only on 05/15/2019  Component Date Value Ref Range Status  . SARS-CoV-2, NAA 05/15/2019 Not Detected  Not Detected Final   Comment: Testing was performed using the cobas(R) SARS-CoV-2 test. This nucleic acid amplification test was developed and its performance characteristics determined by [employer]ect[employer], Dickinson and Company. Nucleic acid amplification tests  include PCR and TMA. This test has not been FDA cleared or approved. This test has been authorized by FDA under an Emergency Use Authorization (EUA). This test is only authorized for the duration of time the declaration that circumstances exist justifying the authorization of the emergency use of in vitro diagnostic tests for detection of SARS-CoV-2 virus and/or diagnosis of COVID-19 infection under section 564(b)(1) of the Act, 21 U.S.C. 157WIO-0(B) (1), unless the authorization is terminated or revoked sooner. When diagnostic testing is negative, the possibility of a false negative result should be considered in the context of a patient's recent exposures and the presence of clinical signs and symptoms consistent with COVID-19. An individual without symptoms                           of COVID-19 and who is not shedding SARS-CoV-2 virus would expect to have a negative (not detected) result in this assay.     (this displays the last labs from the last 3 days)  No results found for: TOTALPROTELP, ALBUMINELP, A1GS, A2GS, BETS, BETA2SER, GAMS, MSPIKE, SPEI (this displays SPEP labs)  No results found for: KPAFRELGTCHN, LAMBDASER, KAPLAMBRATIO (kappa/lambda light chains)  No results found for: HGBA, HGBA2QUANT, HGBFQUANT, HGBSQUAN (Hemoglobinopathy evaluation)   No results found for: LDH  No results found for: IRON, TIBC, IRONPCTSAT (Iron and TIBC)  No results found for: FERRITIN  Urinalysis    Component Value Date/Time   PROTEINUR 6.6 07/03/2018 0000     STUDIES:  No results found.   ELIGIBLE FOR AVAILABLE RESEARCH  PROTOCOL:no   ASSESSMENT: 85 y.o. [place], [place] woman status post right upper inner quadrant lumpectomy 05/24/2016 for a pT1c pN0, stage IA invasive ductal carcinoma, grade 2, strongly estrogen and progesterone receptor positive, HER-2 nonamplified, with close but negative margins; a single sentinel lymph node was removed   (1) Oncotype score of 12 predicts a risk of recurrence outside the breast over the next 10 years of 8% if the patient takes tamoxifen for 5 years.  It also predicts no benefit from adjuvant chemotherapy.  (2) patient declined adjuvant radiation  (3) intolerant of antiestrogens  (a) anastrozole and letrozole caused arthralgias/myalgias  (b) tamoxifen caused vaginal discharge problems  (4) osteopenia with bone density November 2017  (a) s/p alendronate x7 years   PLAN: Bev is now a little over 3 years out from definitive surgery for her breast cancer with no evidence of disease recurrence.  This is very favorable.  We discussed the lesion medial to the right areolar area.  It really is not that different from a year ago.  She says it itches a little.  I offered to set her up for a punch biopsy just to clarify what it is since I am not sure what it is.  She does not see any point in that she says so we will continue to observe it.  I do not think it is going to be a sarcoma since she did not receive radiation to that breast.  She feels taking aromatase inhibitors "ruined her for life" and that that is the reason for her decreased mobility.  We talked about fall risk and how to avoid that problem.  She is taking appropriate pandemic precautions.  She brought me a booklet on bridge which should be helpful  She knows to call for any other issue before the next visit, which will be in 1 year   Fransico Sciandra, Wendy Dad, MD  08/25/19 5:19 PM Medical Oncology and Hematology [facility] [address] [place], [place] [address] Tel. [phone]    Fax.  [phone]    I, Wilburn Mylar, am acting as scribe for Dr. Virgie Hebert. Tehila Sokolow.  I, Lurline Del MD, have reviewed the above documentation for accuracy and completeness, and I agree with the above.

## 2019-08-25 ENCOUNTER — Encounter: Payer: Self-pay | Admitting: Family Medicine

## 2019-08-25 ENCOUNTER — Inpatient Hospital Stay: Payer: Medicare Other | Attending: Oncology | Admitting: Oncology

## 2019-08-25 ENCOUNTER — Other Ambulatory Visit: Payer: Self-pay

## 2019-08-25 VITALS — BP 139/83 | HR 60 | Temp 98.3°F | Resp 18 | Ht 61.5 in | Wt 142.9 lb

## 2019-08-25 DIAGNOSIS — Z853 Personal history of malignant neoplasm of breast: Secondary | ICD-10-CM | POA: Insufficient documentation

## 2019-08-25 DIAGNOSIS — Z79899 Other long term (current) drug therapy: Secondary | ICD-10-CM | POA: Insufficient documentation

## 2019-08-25 DIAGNOSIS — Z170 Estrogen receptor positive status [ER+]: Secondary | ICD-10-CM | POA: Diagnosis not present

## 2019-08-25 DIAGNOSIS — Z801 Family history of malignant neoplasm of trachea, bronchus and lung: Secondary | ICD-10-CM | POA: Diagnosis not present

## 2019-08-25 DIAGNOSIS — Z806 Family history of leukemia: Secondary | ICD-10-CM | POA: Insufficient documentation

## 2019-08-25 DIAGNOSIS — Z803 Family history of malignant neoplasm of breast: Secondary | ICD-10-CM | POA: Insufficient documentation

## 2019-08-25 DIAGNOSIS — M8580 Other specified disorders of bone density and structure, unspecified site: Secondary | ICD-10-CM

## 2019-08-25 DIAGNOSIS — H26493 Other secondary cataract, bilateral: Secondary | ICD-10-CM | POA: Diagnosis not present

## 2019-08-25 DIAGNOSIS — H353132 Nonexudative age-related macular degeneration, bilateral, intermediate dry stage: Secondary | ICD-10-CM | POA: Diagnosis not present

## 2019-08-25 DIAGNOSIS — C50211 Malignant neoplasm of upper-inner quadrant of right female breast: Secondary | ICD-10-CM | POA: Diagnosis not present

## 2019-08-29 ENCOUNTER — Other Ambulatory Visit (HOSPITAL_COMMUNITY): Payer: Self-pay | Admitting: Nurse Practitioner

## 2019-08-29 DIAGNOSIS — I4891 Unspecified atrial fibrillation: Secondary | ICD-10-CM

## 2019-09-04 ENCOUNTER — Encounter: Payer: Self-pay | Admitting: Family Medicine

## 2019-09-06 IMAGING — DX DG CHEST 2V
2 series · 2 of 2 positions shown · non-contrast
Comparison: Chest x-ray 10/18/2018. CT of the abdomen and pelvis
09/23/2018.

CLINICAL DATA: Closed fracture of multiple ribs of the right side,
initial encounter.

EXAM:
CHEST - 2 VIEW; RIGHT RIBS - 2 VIEW

[chest pa]
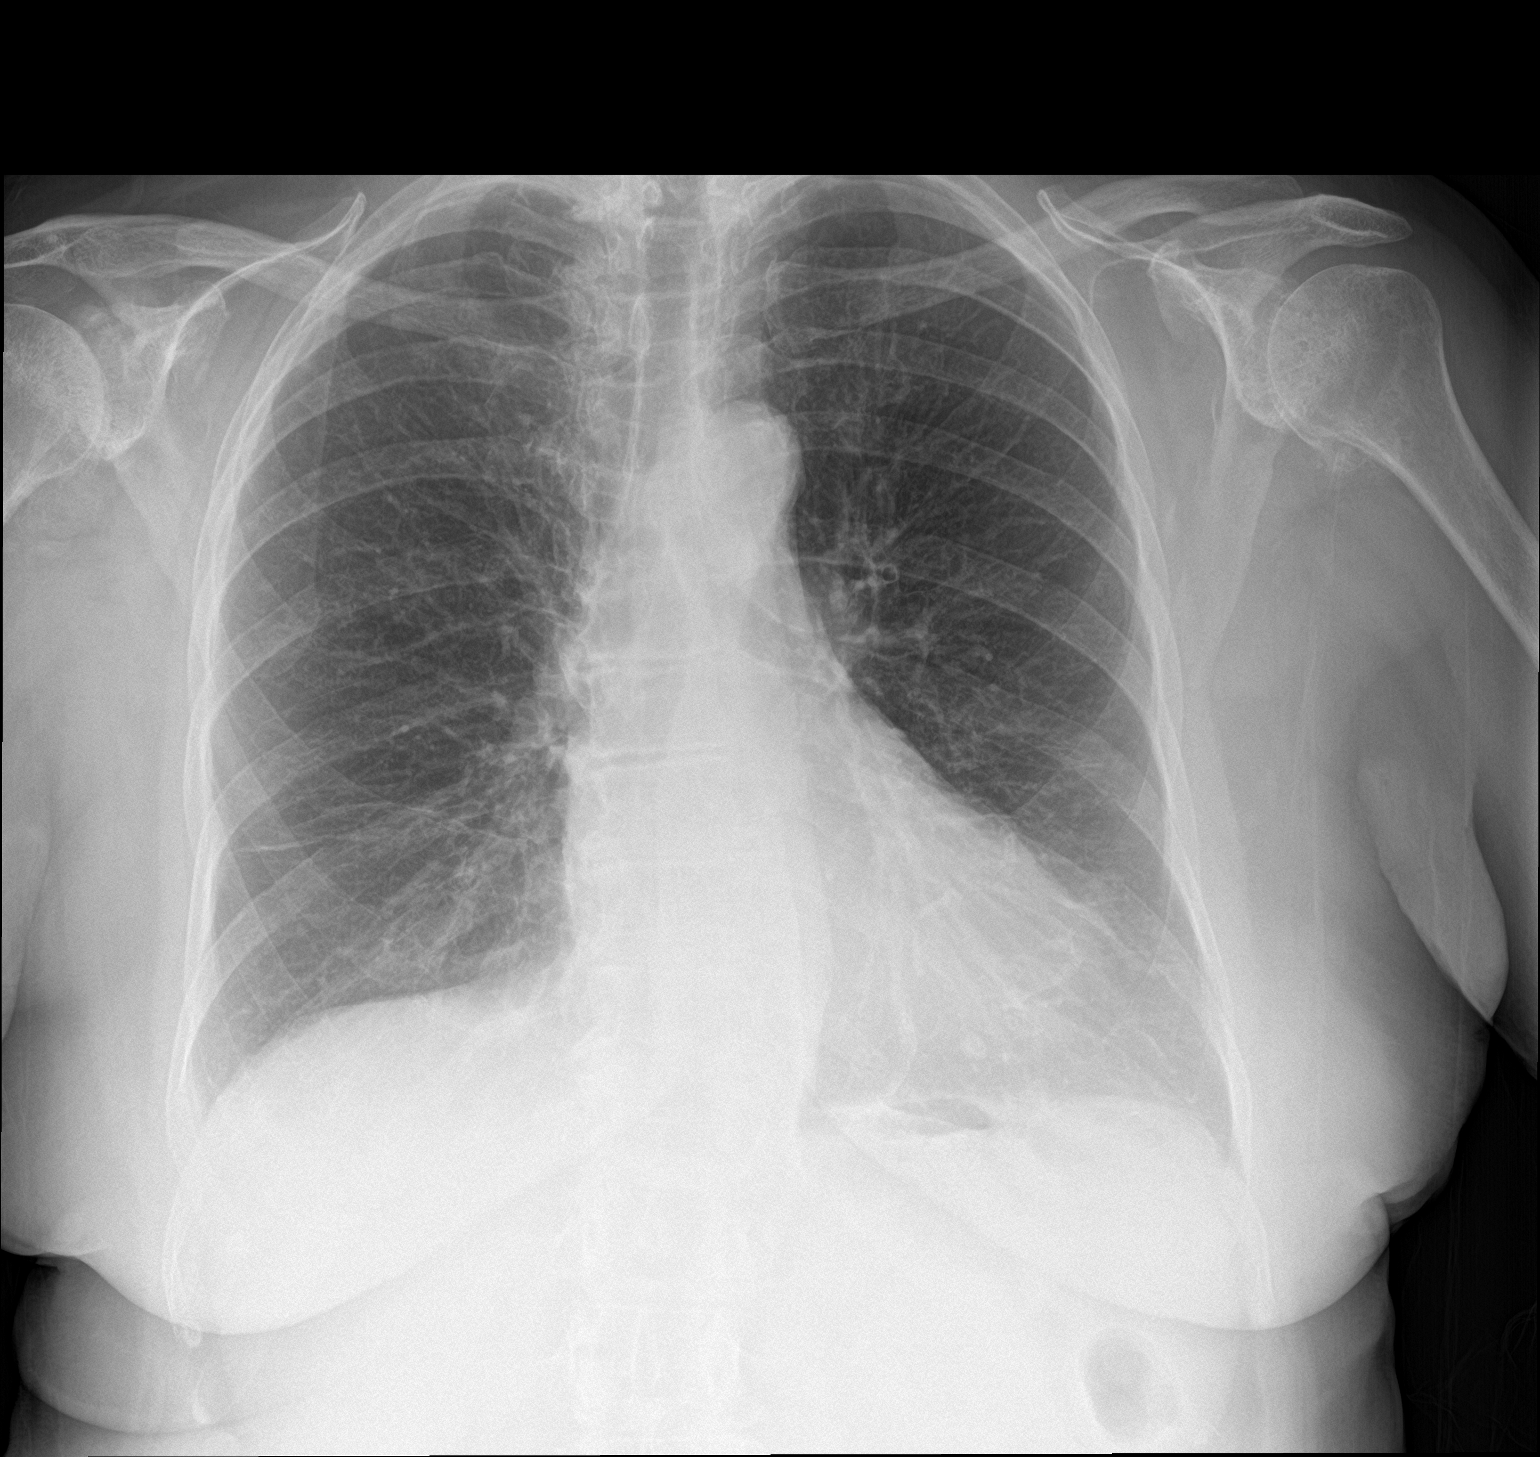

[chest lat]
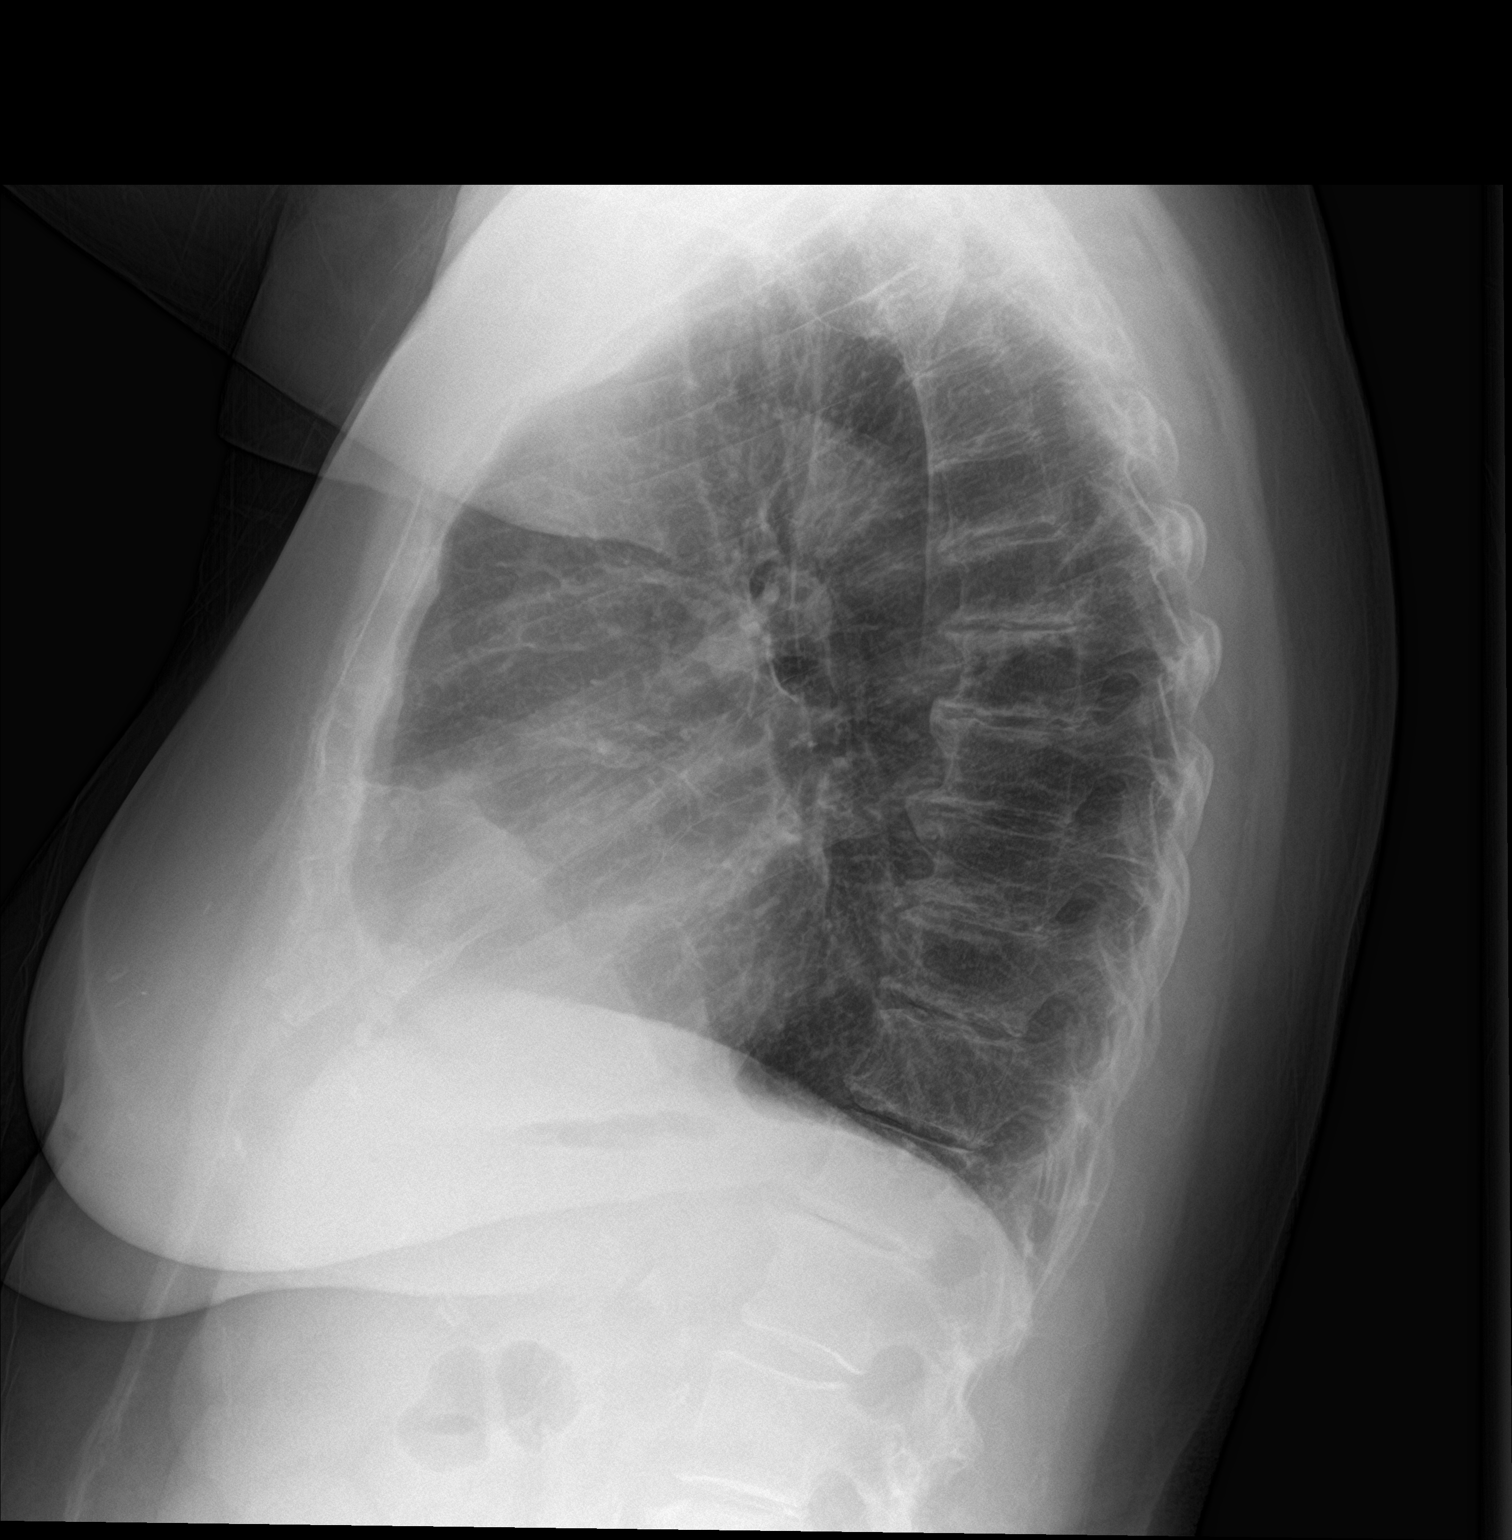

[2 of 2 positions shown; findings below may reference images not displayed]

FINDINGS: Heart size is normal. Atherosclerotic changes are noted at the arch.
Lungs are hyperinflated. There is no edema or effusion. No focal
airspace disease is present.

Dedicated views of the right ribs demonstrate no acute or healing
fractures. There is no pneumothorax. CT findings may be artifactual.
IMPRESSION: 1. No acute cardiopulmonary abnormality.
2. No acute or healing fracture.
3. Chronic changes of COPD.

## 2019-09-19 ENCOUNTER — Encounter: Payer: Self-pay | Admitting: Family Medicine

## 2019-09-27 ENCOUNTER — Encounter: Payer: Self-pay | Admitting: Family Medicine

## 2019-10-15 ENCOUNTER — Encounter: Payer: Self-pay | Admitting: Family Medicine

## 2019-10-22 ENCOUNTER — Ambulatory Visit: Payer: Medicare Other | Admitting: Family Medicine

## 2019-10-24 NOTE — Progress Notes (Signed)
[place] at [facility] [address] [place], [place] [address] [phone] [id number]  Date:  10/26/2019   Name:  Wendy Hebert   DOB:  12-05-1932   MRN:  [id number]  PCP:  Darreld Mclean, MD    Chief Complaint: Rash (vaginal rash, itching, irritated )   History of Present Illness:  Wendy Hebert is a 86 y.o. very pleasant female patient who presents with the following:  Patient with history of osteopenia, breast cancer, atrial fibrillation, lichen sclerosus, TIA October 2020  Here today with concern of possible fungal infection in her groin area She had contacted me with concern of recurrent fungal infection on her leg, I asked her to come in when it was active so we could examine her.  She has been struggling with recurrent skin redness and irritation for about 2 months, seem to start her in a long car trip She noted a flair up again about a week ago- she has been using an OTC antifungal- this does seem to help and it is nearly gone.  However she notes it may come back again-it tends to do this She is sometimes using the cream BID although it says once a day, she may use for more than a week sometimes   Flu shot up-to-date She also had her covid vaccine  Pneumonia vaccine?--Check state database- per pt she had both shots Most recent routine labs in June, can update today if she would like Per pt she had some labs done by [facility] last month, I was able to view these on care everywhere GFR 49, creat 1.06 Most recent visit with oncology in December.  Original diagnosis and lumpectomy in 2017 She has been intolerant of antiestrogens so is not taking now   Eliquis Lopressor Zocor 10  She also notes a few skin lesions on her scalp and face which she would like frozen today  Patient Active Problem List   Diagnosis Date Noted  . Osteopenia 08/25/2019  . Malignant neoplasm of upper-inner quadrant of right breast in female,  estrogen receptor positive ([place]) 02/16/2019  . Atrial fibrillation ([place]) 10/01/2018  . Gait difficulty 03/20/2018  . Lichen sclerosus [id number]    Past Medical History:  Diagnosis Date  . Arthritis   . Cancer ([place]) 2017   1.6 lumpectomy  . Lichen sclerosus     Past Surgical History:  Procedure Laterality Date  . APPENDECTOMY    . BOWEL RESECTION    . BREAST LUMPECTOMY  2017  . CARDIOVERSION N/A 11/10/2018   Procedure: CARDIOVERSION;  Surgeon: [name], MD;  Location: [facility];  Service: Cardiovascular;  Laterality: N/A;  . CESAREAN SECTION    . HERNIA REPAIR    . TONSILLECTOMY      Social History   Tobacco Use  . Smoking status: Former Research scientist (life sciences)  . Smokeless tobacco: Never Used  . Tobacco comment: 30+ years  Substance Use Topics  . Alcohol use: Yes    Comment: occasionally  . Drug use: Never    Family History  Problem Relation Age of Onset  . Cancer Father        Lung  . Cancer Sister     No Known Allergies  Medication list has been reviewed and updated.  Current Outpatient Medications on File Prior to Visit  Medication Sig Dispense Refill  . apixaban (ELIQUIS) 5 MG TABS tablet Take 1 tablet (5 mg total) by mouth 2 (two) times  daily. 60 tablet 6  . Calcium Carb-Cholecalciferol (CALCIUM 600 + D PO) Take 1 tablet by mouth every evening.    . clobetasol cream (TEMOVATE) [id number] % Apply 1 application topically daily.    . metoprolol tartrate (LOPRESSOR) 25 MG tablet TAKE 1 AND 1/2 TABLETS BY  MOUTH EVERY MORNING AND 1  TABLET BY MOUTH IN THE  EVENING 225 tablet 3  . Multiple Vitamin (MULTIVITAMIN WITH MINERALS) TABS tablet Take 1 tablet by mouth every evening.    . Multiple Vitamins-Minerals (PRESERVISION AREDS 2) CAPS Take 2 capsules by mouth every evening.    . TURMERIC PO Take by mouth.    . vitamin C (ASCORBIC ACID) 500 MG tablet Take 500 mg by mouth daily.     No current facility-administered medications on file prior to visit.    Review of  Systems:  As per HPI- otherwise negative.   Physical Examination: Vitals:   10/26/19 1433  BP: 108/70  Pulse: 72  Resp: 17  Temp: (!) 95.4 F (35.2 C)   Vitals:   10/26/19 1433  Weight: 140 lb (63.5 kg)  Height: 5' 1.5" (1.562 m)   Body mass index is 26.02 kg/m. Ideal Body Weight: Weight in (lb) to have BMI = 25: 134.2  GEN: no acute distress.  Normal weight for age, looks well  HEENT: Atraumatic, Normocephalic.  Ears and Nose: No external deformity. CV: RRR, No M/G/R. No JVD. No thrill. No extra heart sounds. PULM: CTA B, no wheezes, crackles, rhonchi. No retractions. No resp. distress. No accessory muscle use. ABD: S, NT, ND, +BS. No rebound. No HSM. EXTR: No c/c/e PSYCH: Normally interactive. Conversant.  Vulva displays some dilated blood vessels, and also mild redness and hyperpigmentation in the groin, consistent with monilia She has 2 actinic keratoses on the crown of her head, 3 seborrheic keratoses on the right face  Verbal consent obtained, all 5 of the above-mentioned skin lesions were treated with cryotherapy, liquid nitrogen x3 each.  Patient tolerated well, no blood loss   Assessment and Plan: Monilia infection - Plan: fluconazole (DIFLUCAN) 150 MG tablet  Atrial fibrillation, unspecified type ([place])  History of breast cancer  Actinic keratosis  Seborrheic keratosis  Here today with suspected monilia/tinea cruris.  We will treat with Diflucan 150 once weekly for 3 to 4 weeks.  Patient is cautioned that this could increase her risk of bleeding in combination with Xarelto She will let me know if this treatment is not successful  Cryotherapy used for skin lesions as mentioned above  I have asked her to return for repeat cryotherapy as needed This visit occurred during the SARS-CoV-2 public health emergency.  Safety protocols were in place, including screening questions prior to the visit, additional usage of staff PPE, and extensive cleaning of exam room  while observing appropriate contact time as indicated for disinfecting solutions.    Signed Lamar Blinks, MD

## 2019-10-26 ENCOUNTER — Ambulatory Visit (INDEPENDENT_AMBULATORY_CARE_PROVIDER_SITE_OTHER): Payer: Medicare Other | Admitting: Family Medicine

## 2019-10-26 ENCOUNTER — Encounter: Payer: Self-pay | Admitting: Family Medicine

## 2019-10-26 ENCOUNTER — Other Ambulatory Visit: Payer: Self-pay

## 2019-10-26 VITALS — BP 108/70 | HR 72 | Temp 95.4°F | Resp 17 | Ht 61.5 in | Wt 140.0 lb

## 2019-10-26 DIAGNOSIS — I4891 Unspecified atrial fibrillation: Secondary | ICD-10-CM | POA: Diagnosis not present

## 2019-10-26 DIAGNOSIS — L821 Other seborrheic keratosis: Secondary | ICD-10-CM

## 2019-10-26 DIAGNOSIS — L570 Actinic keratosis: Secondary | ICD-10-CM

## 2019-10-26 DIAGNOSIS — B379 Candidiasis, unspecified: Secondary | ICD-10-CM | POA: Diagnosis not present

## 2019-10-26 DIAGNOSIS — Z853 Personal history of malignant neoplasm of breast: Secondary | ICD-10-CM

## 2019-10-26 MED ORDER — FLUCONAZOLE 150 MG PO TABS: 150.0000 mg | ORAL_TABLET | Freq: Once | ORAL | 0 refills | Status: AC

## 2019-10-26 NOTE — Patient Instructions (Signed)
It was good to see you today- let's try using an oral diflucan weekly for 3-4 weeks for your vulvar/ groin yeast infection.  Please let me know if this does not clear up your symptoms or if you are getting worse

## 2019-10-29 ENCOUNTER — Encounter: Payer: Self-pay | Admitting: Family Medicine

## 2019-10-29 ENCOUNTER — Other Ambulatory Visit (HOSPITAL_COMMUNITY): Payer: Self-pay | Admitting: Nurse Practitioner

## 2019-10-29 DIAGNOSIS — I4891 Unspecified atrial fibrillation: Secondary | ICD-10-CM

## 2019-10-30 NOTE — Telephone Encounter (Signed)
Patient called back and said she was going to reach out to her cardiologist today in case this was her heart causing the issue!

## 2019-10-30 NOTE — Telephone Encounter (Signed)
Called Wendy Hebert, she said she is feeling much better today. She states she just felt bad for about 4 days but is now feeling much better. I advised to her even if she is feeling better she might need to still be evaluated at an urgent care or ER as our office is full and its best she be seen in person. She declined for now but states she will go if symptoms worsen or return.

## 2019-11-02 DIAGNOSIS — I4891 Unspecified atrial fibrillation: Secondary | ICD-10-CM | POA: Diagnosis not present

## 2019-11-02 DIAGNOSIS — R0600 Dyspnea, unspecified: Secondary | ICD-10-CM | POA: Diagnosis not present

## 2019-11-04 ENCOUNTER — Encounter: Payer: Self-pay | Admitting: Family Medicine

## 2019-11-07 DIAGNOSIS — D62 Acute posthemorrhagic anemia: Secondary | ICD-10-CM | POA: Diagnosis not present

## 2019-11-07 DIAGNOSIS — R0602 Shortness of breath: Secondary | ICD-10-CM | POA: Diagnosis not present

## 2019-11-07 DIAGNOSIS — D539 Nutritional anemia, unspecified: Secondary | ICD-10-CM | POA: Diagnosis not present

## 2019-11-07 DIAGNOSIS — Z7901 Long term (current) use of anticoagulants: Secondary | ICD-10-CM | POA: Diagnosis not present

## 2019-11-07 DIAGNOSIS — I129 Hypertensive chronic kidney disease with stage 1 through stage 4 chronic kidney disease, or unspecified chronic kidney disease: Secondary | ICD-10-CM | POA: Diagnosis not present

## 2019-11-07 DIAGNOSIS — K449 Diaphragmatic hernia without obstruction or gangrene: Secondary | ICD-10-CM | POA: Diagnosis not present

## 2019-11-07 DIAGNOSIS — I4891 Unspecified atrial fibrillation: Secondary | ICD-10-CM | POA: Diagnosis not present

## 2019-11-07 DIAGNOSIS — Z87891 Personal history of nicotine dependence: Secondary | ICD-10-CM | POA: Diagnosis not present

## 2019-11-07 DIAGNOSIS — K5730 Diverticulosis of large intestine without perforation or abscess without bleeding: Secondary | ICD-10-CM | POA: Diagnosis not present

## 2019-11-07 DIAGNOSIS — I252 Old myocardial infarction: Secondary | ICD-10-CM | POA: Diagnosis not present

## 2019-11-07 DIAGNOSIS — Q2730 Arteriovenous malformation, site unspecified: Secondary | ICD-10-CM | POA: Diagnosis not present

## 2019-11-07 DIAGNOSIS — N183 Chronic kidney disease, stage 3 unspecified: Secondary | ICD-10-CM | POA: Diagnosis not present

## 2019-11-07 DIAGNOSIS — K5521 Angiodysplasia of colon with hemorrhage: Secondary | ICD-10-CM | POA: Diagnosis not present

## 2019-11-07 DIAGNOSIS — K921 Melena: Secondary | ICD-10-CM | POA: Diagnosis not present

## 2019-11-07 DIAGNOSIS — Z79899 Other long term (current) drug therapy: Secondary | ICD-10-CM | POA: Diagnosis not present

## 2019-11-08 DIAGNOSIS — D649 Anemia, unspecified: Secondary | ICD-10-CM | POA: Diagnosis not present

## 2019-11-08 DIAGNOSIS — D62 Acute posthemorrhagic anemia: Secondary | ICD-10-CM | POA: Diagnosis not present

## 2019-11-08 DIAGNOSIS — R195 Other fecal abnormalities: Secondary | ICD-10-CM | POA: Diagnosis not present

## 2019-11-08 DIAGNOSIS — I4891 Unspecified atrial fibrillation: Secondary | ICD-10-CM | POA: Diagnosis not present

## 2019-11-08 DIAGNOSIS — Z7901 Long term (current) use of anticoagulants: Secondary | ICD-10-CM | POA: Diagnosis not present

## 2019-11-08 DIAGNOSIS — N189 Chronic kidney disease, unspecified: Secondary | ICD-10-CM | POA: Diagnosis not present

## 2019-11-08 DIAGNOSIS — I129 Hypertensive chronic kidney disease with stage 1 through stage 4 chronic kidney disease, or unspecified chronic kidney disease: Secondary | ICD-10-CM | POA: Diagnosis not present

## 2019-11-08 DIAGNOSIS — Z9049 Acquired absence of other specified parts of digestive tract: Secondary | ICD-10-CM | POA: Diagnosis not present

## 2019-11-09 DIAGNOSIS — K5730 Diverticulosis of large intestine without perforation or abscess without bleeding: Secondary | ICD-10-CM | POA: Diagnosis not present

## 2019-11-09 DIAGNOSIS — I4891 Unspecified atrial fibrillation: Secondary | ICD-10-CM | POA: Diagnosis not present

## 2019-11-09 DIAGNOSIS — K5520 Angiodysplasia of colon without hemorrhage: Secondary | ICD-10-CM | POA: Diagnosis not present

## 2019-11-09 DIAGNOSIS — D62 Acute posthemorrhagic anemia: Secondary | ICD-10-CM | POA: Diagnosis not present

## 2019-11-09 DIAGNOSIS — D649 Anemia, unspecified: Secondary | ICD-10-CM | POA: Diagnosis not present

## 2019-11-09 DIAGNOSIS — K5521 Angiodysplasia of colon with hemorrhage: Secondary | ICD-10-CM | POA: Diagnosis not present

## 2019-11-09 DIAGNOSIS — Q2733 Arteriovenous malformation of digestive system vessel: Secondary | ICD-10-CM | POA: Diagnosis not present

## 2019-11-09 DIAGNOSIS — N189 Chronic kidney disease, unspecified: Secondary | ICD-10-CM | POA: Diagnosis not present

## 2019-11-09 DIAGNOSIS — K922 Gastrointestinal hemorrhage, unspecified: Secondary | ICD-10-CM | POA: Diagnosis not present

## 2019-11-09 DIAGNOSIS — I129 Hypertensive chronic kidney disease with stage 1 through stage 4 chronic kidney disease, or unspecified chronic kidney disease: Secondary | ICD-10-CM | POA: Diagnosis not present

## 2019-11-09 MED ORDER — CHOLECALCIFEROL 25 MCG (1000 UT) PO TABS
1000.00 | ORAL_TABLET | ORAL | Status: DC
Start: 2019-11-10 — End: 2019-11-09

## 2019-11-09 MED ORDER — METOPROLOL TARTRATE 25 MG PO TABS
25.00 | ORAL_TABLET | ORAL | Status: DC
Start: 2019-11-09 — End: 2019-11-09

## 2019-11-09 MED ORDER — DSS 100 MG PO CAPS
100.00 | ORAL_CAPSULE | ORAL | Status: DC
Start: ? — End: 2019-11-09

## 2019-11-09 MED ORDER — GENERIC EXTERNAL MEDICATION
37.50 | Status: DC
Start: 2019-11-10 — End: 2019-11-09

## 2019-11-09 MED ORDER — ZOLPIDEM TARTRATE 5 MG PO TABS
5.00 | ORAL_TABLET | ORAL | Status: DC
Start: ? — End: 2019-11-09

## 2019-11-09 MED ORDER — ONDANSETRON HCL 4 MG/2ML IJ SOLN
4.00 | INTRAMUSCULAR | Status: DC
Start: ? — End: 2019-11-09

## 2019-11-09 MED ORDER — ALUM & MAG HYDROXIDE-SIMETH 200-200-20 MG/5ML PO SUSP
30.00 | ORAL | Status: DC
Start: ? — End: 2019-11-09

## 2019-11-09 MED ORDER — ACETAMINOPHEN 325 MG PO TABS
650.00 | ORAL_TABLET | ORAL | Status: DC
Start: ? — End: 2019-11-09

## 2019-11-11 ENCOUNTER — Encounter: Payer: Self-pay | Admitting: Family Medicine

## 2019-11-11 NOTE — Telephone Encounter (Signed)
Called her back-she was recently admitted to [facility] with an acute GI bleed, she had an AVM which was cauterized.  She was transfused blood and released to home this past Monday.  Patient notes that she is feeling somewhat better, but still feels weak and short of breath.  No more GI bleeding apparent.  She notes that her shortness of breath is improving, though she still does not feel well.  She denies any chest pain  I encouraged her to be seen at the ER tonight; she declines at this time, she feels that she is improving.  I made her an appointment to see her in the clinic tomorrow-she does agree to go to the ER if getting worse between now

## 2019-11-11 NOTE — Telephone Encounter (Signed)
Pt wanted to be let [place] know she was  Still waiting on a call back

## 2019-11-12 ENCOUNTER — Other Ambulatory Visit: Payer: Self-pay

## 2019-11-12 ENCOUNTER — Ambulatory Visit (INDEPENDENT_AMBULATORY_CARE_PROVIDER_SITE_OTHER): Payer: Medicare Other | Admitting: Family Medicine

## 2019-11-12 ENCOUNTER — Encounter: Payer: Self-pay | Admitting: Family Medicine

## 2019-11-12 ENCOUNTER — Ambulatory Visit (HOSPITAL_BASED_OUTPATIENT_CLINIC_OR_DEPARTMENT_OTHER)
Admission: RE | Admit: 2019-11-12 | Discharge: 2019-11-12 | Disposition: A | Discharge status: Home / Self Care | Payer: Medicare Other | Source: Ambulatory Visit | Attending: Family Medicine | Admitting: Family Medicine

## 2019-11-12 VITALS — BP 101/66 | HR 73 | Temp 96.0°F | Resp 18 | Ht 61.5 in | Wt 140.0 lb

## 2019-11-12 DIAGNOSIS — R0602 Shortness of breath: Secondary | ICD-10-CM | POA: Diagnosis not present

## 2019-11-12 DIAGNOSIS — I4891 Unspecified atrial fibrillation: Secondary | ICD-10-CM | POA: Diagnosis not present

## 2019-11-12 DIAGNOSIS — Z09 Encounter for follow-up examination after completed treatment for conditions other than malignant neoplasm: Secondary | ICD-10-CM | POA: Diagnosis not present

## 2019-11-12 DIAGNOSIS — K922 Gastrointestinal hemorrhage, unspecified: Secondary | ICD-10-CM

## 2019-11-12 LAB — CBC
HCT: 25.1 % — ABNORMAL LOW (ref 36.0–46.0)
Hemoglobin: 8.4 g/dL — ABNORMAL LOW (ref 12.0–15.0)
MCHC: 33.3 g/dL (ref 30.0–36.0)
MCV: 100.1 fl — ABNORMAL HIGH (ref 78.0–100.0)
Platelets: 332 10*3/uL (ref 150.0–400.0)
RBC: 2.51 Mil/uL — ABNORMAL LOW (ref 3.87–5.11)
RDW: 18.8 % — ABNORMAL HIGH (ref 11.5–15.5)
WBC: 9.8 10*3/uL (ref 4.0–10.5)

## 2019-11-12 LAB — FERRITIN: Ferritin: 26.6 ng/mL (ref 10.0–291.0)

## 2019-11-12 NOTE — Patient Instructions (Signed)
Good to see you again today- I am glad that you are feeling better  Please go to the lab and then to the ground floor for a chest film I will be in touch with your reports later today

## 2019-11-12 NOTE — Progress Notes (Signed)
[place] at [facility] [address] [place], [place] [address] [phone]  Date:  11/12/2019   Name:  Wendy Hebert   DOB:  [birth year]   MRN:  [id number]  PCP:  Darreld Mclean, MD    Chief Complaint: Shortness of Breath (unable to walk long distance, no chest pain)   History of Present Illness:  Wendy Hebert is a 86 y.o. very pleasant female patient who presents with the following:  Patient with history of atrial fibrillation, breast cancer, lichen sclerosis She had contacted me yesterday with concern of shortness of breath, we made this appointment for today She was seen in the ER at [facility] on March 6 and was admitted for 2 days with an AVM GI bleed She was to start back on her eliquis for a fib on 3/9-she is taking this again now Her discharge hg was 7.6 She is not having any dark tarry stools or grossly bloody stools.  She notes that her stools at time of GI bleed appeared "like red clay".  She has not had any stools with this appearance either She is SOB with walking - but no CP or SOB-she is okay at rest She is here today with her husband, who is concerned about her shortness of breath.  She is not having any cough or fever They are not checking her oxygenation level at home Her cardiologist is Dr Elonda Husky at [facility]   No abd pain She is eating normally, drinking fluids She is currently back to taking all of her routine medications No prior history of GI bleed that she can recall   Admit date: 11/07/2019 Discharge date and time: 11/09/2019  Admitting Physician: Shann Medal, MD  Discharge Physician: Judie Grieve, MD  Admission Diagnoses:  Shortness of breath [R06.02]   Discharge Diagnoses:  Principal Problem: Macrocytic anemia Active Problems: AF (atrial fibrillation) (HCC) Anticoagulated CKD stage 3 Anemia Resolved Problems: Blood in stool  Hospital Course:  For full details, please  see H&P, progress notes, consult notes and ancillary notes.   Wendy Hebert is a 86 yo F with h/o atrial fibrillation on Eliquis, breast cancer, HTN presented with SOB found to have acute on chronic anemia concerning for blood loss 2/2 GI source  # acute blood loss anemia, improving # symptomatic anemia, improving # Cecal AVM, s/p cauterization - s/p 1 u pRBC transfusion - hgb 7.6 on day of discharge - GI consulted, EGD without active bleeding sources - Colonoscopy revealed cecal bleeding AVM s/p cauterization; additional 3 spots AVM non-bleeding also treated - GI has signed off - pt denies further episodes of bleeding prior to discharge - remained hemodynamically stable, pt would like to go home - OK to resume Eliquis on 3/9; discussed risk and benefit with pt and Ms.Braithwaite wants to resume Eliquis to minimize risk for stroke - pt will follow-up with PCP in 1 week to monitor for further signs of bleeding.   Initially pt's BP was soft at 82/50 while she came back from endoscopy suite, it improved after PO intake and 250 cc bolus to 98/63, patient is asymptomatic and does not have signs of orthostatic hypotension.  # atrial fibrillation - patient reports only having been taking metoprolol and not Cartia-XL due to low heart rate. She has been refusing Cardizem inpatient. HR 60-100s inpatient, acceptable - continue metoprolol - holding anticoagulation currently  # HTN - metoprolol as above - BP acceptable inpatient  #  CKD - 0.91 this morning, seems to be around baseline - continue to monitor  Disposition: Home or Self Care   Discharge Follow-up Action Items: 1. Resume Eliquis on 11/10/2019 2. Continue metoprolol 3. Patient reports not been taking Cardizem due to low-heart rate. Follow-up with PCP/cardiology and determine indication.  4. Continue monitoring HR at home 5. Follow-up with PCP in 1 week, consider repeating CBC, monitor bleeding   Patient Active Problem List    Diagnosis Date Noted  . Osteopenia 08/25/2019  . Malignant neoplasm of upper-inner quadrant of right breast in female, estrogen receptor positive ([place]) 02/16/2019  . Atrial fibrillation ([place]) 10/01/2018  . Gait difficulty 03/20/2018  . Lichen sclerosus [id number]    Past Medical History:  Diagnosis Date  . Arthritis   . Cancer ([place]) 2017   1.6 lumpectomy  . Lichen sclerosus     Past Surgical History:  Procedure Laterality Date  . APPENDECTOMY    . BOWEL RESECTION    . BREAST LUMPECTOMY  2017  . CARDIOVERSION N/A 11/10/2018   Procedure: CARDIOVERSION;  Surgeon: [name], MD;  Location: [facility];  Service: Cardiovascular;  Laterality: N/A;  . CESAREAN SECTION    . HERNIA REPAIR    . TONSILLECTOMY      Social History   Tobacco Use  . Smoking status: Former Research scientist (life sciences)  . Smokeless tobacco: Never Used  . Tobacco comment: 30+ years  Substance Use Topics  . Alcohol use: Yes    Comment: occasionally  . Drug use: Never    Family History  Problem Relation Age of Onset  . Cancer Father        Lung  . Cancer Sister     No Known Allergies  Medication list has been reviewed and updated.  Current Outpatient Medications on File Prior to Visit  Medication Sig Dispense Refill  . Calcium Carb-Cholecalciferol (CALCIUM 600 + D PO) Take 1 tablet by mouth every evening.    . clobetasol cream (TEMOVATE) [id number] % Apply 1 application topically daily.    Marland Kitchen diltiazem (CARDIZEM CD) 180 MG 24 hr capsule Take 180 mg by mouth daily.    Marland Kitchen ELIQUIS 5 MG TABS tablet TAKE 1 TABLET BY MOUTH TWO  TIMES DAILY 180 tablet 3  . metoprolol tartrate (LOPRESSOR) 25 MG tablet TAKE 1 AND 1/2 TABLETS BY  MOUTH EVERY MORNING AND 1  TABLET BY MOUTH IN THE  EVENING 225 tablet 3  . Multiple Vitamin (MULTIVITAMIN WITH MINERALS) TABS tablet Take 1 tablet by mouth every evening.    . Multiple Vitamins-Minerals (PRESERVISION AREDS 2) CAPS Take 2 capsules by mouth every evening.    . TURMERIC PO Take by  mouth.    . vitamin C (ASCORBIC ACID) 500 MG tablet Take 500 mg by mouth daily.     No current facility-administered medications on file prior to visit.    Review of Systems:  As per HPI- otherwise negative.   Physical Examination: Vitals:   11/12/19 1011  BP: 101/66  Pulse: 73  Resp: 18  Temp: (!) 96 F (35.6 C)  SpO2: 100%   Vitals:   11/12/19 1011  Weight: 140 lb (63.5 kg)  Height: 5' 1.5" (1.562 m)   Body mass index is 26.02 kg/m. Ideal Body Weight: Weight in (lb) to have BMI = 25: 134.2  GEN: no acute distress.  Normal weight for age, looks well HEENT: Atraumatic, Normocephalic.  Ears and Nose: No external deformity. CV: RRR-may currently be in sinus rhythm, No M/G/R. No  JVD. No thrill. No extra heart sounds. PULM: CTA B, no wheezes, crackles, rhonchi. No retractions. No resp. distress. No accessory muscle use. ABD: S, NT, ND, +BS. No rebound. No HSM.  Belly is benign EXTR: No c/c/e Riding in wheelchair today due to fatigue PSYCH: Normally interactive. Conversant.   BP Readings from Last 3 Encounters:  11/12/19 101/66  10/26/19 108/70  08/25/19 139/83    Assessment and Plan: Hospital discharge follow-up - Plan: CANCELED: CBC  Gastrointestinal hemorrhage, unspecified gastrointestinal hemorrhage type - Plan: CBC, Ferritin, DG Chest 2 View, CANCELED: CBC  Atrial fibrillation, unspecified type [facility])  Here today for hospital follow-up-she was admitted from March 6-8 with an acute GI bleed.  She was transfused 1 unit of blood, discharged home with a hemoglobin of 7.6 She continues to feel tired and short of breath with activity, but does not feel that she is getting worse.  Her husband was quite concerned and wanted her to be seen Explained that her fatigue is likely due to continued anemia.  We will certainly check a chest film today, and we will also obtain a CBC and ferritin If her hemoglobin is no better or worse, she will need an urgent referral to  hematology for transfusion  She is currently taking her Eliquis again for atrial fibrillation, rate is under control with diltiazem and metoprolol Blood pressure still a bit soft but improved from when she was inpatient  This visit occurred during the SARS-CoV-2 public health emergency.  Safety protocols were in place, including screening questions prior to the visit, additional usage of staff PPE, and extensive cleaning of exam room while observing appropriate contact time as indicated for disinfecting solutions.    Signed Lamar Blinks, MD  Received her labs and x-ray report as below Gave her a call  DG Chest 2 View  Result Date: 11/12/2019 CLINICAL DATA:  Shortness of breath for 3 weeks. EXAM: CHEST - 2 VIEW COMPARISON:  11/07/2019 from [facility]. FINDINGS: Mild hyperinflation. Midline trachea. Borderline cardiomegaly. Atherosclerosis in the transverse aorta. No pleural effusion or pneumothorax. Mild interstitial thickening at the lung bases with mild left base scarring. IMPRESSION: Hyperinflation and interstitial thickening, likely related to history of smoking. Left base scarring, without superimposed acute process. Aortic Atherosclerosis (ICD10-I70.0). Electronically Signed   By: Abigail Miyamoto M.D.   On: 11/12/2019 11:32   Hemoglobin is still low but is improving Certainly hemoglobin of 8.3 may cause fatigue and shortness of breath with exertion (Labs from March 8 showed RBC 2.3, hemoglobin 7.6, crit 23) She is not currently taking iron-encouraged her to try an over-the-counter iron supplement as tolerated, may also try eating higher iron foods and doing cooking in a cast iron skillet We plan for a 2-week recheck, she is to alert me if getting worse or if not continuing to improve in the meantime.  Scheduled a 2-week visit   Results for orders placed or performed in visit on 11/12/19  CBC  Result Value Ref Range   WBC 9.8 4.0 - 10.5 K/uL   RBC 2.51 Repeated and verified  X2. (L) 3.87 - 5.11 Mil/uL   Platelets 332.0 150.0 - 400.0 K/uL   Hemoglobin 8.4 Repeated and verified X2. (L) 12.0 - 15.0 g/dL   HCT 25.1 Repeated and verified X2. (L) 36.0 - 46.0 %   MCV 100.1 (H) 78.0 - 100.0 fl   MCHC 33.3 30.0 - 36.0 g/dL   RDW 18.8 (H) 11.5 - 15.5 %  Ferritin  Result Value  Ref Range   Ferritin 26.6 10.0 - 291.0 ng/mL

## 2019-11-25 ENCOUNTER — Other Ambulatory Visit: Payer: Self-pay

## 2019-11-25 NOTE — Patient Instructions (Addendum)
It was great to see you again today, I will be in touch with your blood work and we will plan the next step If your blood counts are worse or no better we will likely need to get you a transfusion; otherwise you probably just need more time.  Continue to get plenty of dietary iron

## 2019-11-25 NOTE — Progress Notes (Addendum)
[place] at [facility] [address], [place], [place] [address] [phone]  Date:  11/26/2019   Name:  Wendy Hebert   DOB:  1933/08/24   MRN:  [id number]  PCP:  Darreld Mclean, MD    Chief Complaint: GI Bleeding (follow up) and Skin Lesion (remove lesion on face and arm)   History of Present Illness:  Wendy Hebert is a 86 y.o. very pleasant female patient who presents with the following:  Patient with history of atrial fibrillation, lichen sclerosus, breast cancer Here today for follow-up visit from recent GI bleed-recheck blood counts today  She was admitted to [facility] for 2 days at the beginning of March with an AVM bleed With follow-up on March 11, at that time she was feeling okay but still very tired and short of breath  Her hemoglobin was found to be 8.4 improved from discharge but still low Her discharge hemoglobin was 7.6  She has noted red/ orange stools again which is what we saw with her last GI bleed  She is not feeling SOB, but she just feels still very worn out No pain, no abd pain She feels too tired to do much walking   Patient Active Problem List   Diagnosis Date Noted  . Osteopenia 08/25/2019  . Malignant neoplasm of upper-inner quadrant of right breast in female, estrogen receptor positive ([place]) 02/16/2019  . Atrial fibrillation ([place]) 10/01/2018  . Gait difficulty 03/20/2018  . Lichen sclerosus [id number]    Past Medical History:  Diagnosis Date  . Arthritis   . Cancer ([place]) 2017   1.6 lumpectomy  . Lichen sclerosus     Past Surgical History:  Procedure Laterality Date  . APPENDECTOMY    . BOWEL RESECTION    . BREAST LUMPECTOMY  2017  . CARDIOVERSION N/A 11/10/2018   Procedure: CARDIOVERSION;  Surgeon: [name], MD;  Location: [facility];  Service: Cardiovascular;  Laterality: N/A;  . CESAREAN SECTION    . HERNIA REPAIR    . TONSILLECTOMY      Social  History   Tobacco Use  . Smoking status: Former Research scientist (life sciences)  . Smokeless tobacco: Never Used  . Tobacco comment: 30+ years  Substance Use Topics  . Alcohol use: Yes    Comment: occasionally  . Drug use: Never    Family History  Problem Relation Age of Onset  . Cancer Father        Lung  . Cancer Sister     No Known Allergies  Medication list has been reviewed and updated.  Current Outpatient Medications on File Prior to Visit  Medication Sig Dispense Refill  . Calcium Carb-Cholecalciferol (CALCIUM 600 + D PO) Take 1 tablet by mouth every evening.    . clobetasol cream (TEMOVATE) [id number] % Apply 1 application topically daily.    Marland Kitchen diltiazem (CARDIZEM CD) 180 MG 24 hr capsule Take 180 mg by mouth daily.    Marland Kitchen ELIQUIS 5 MG TABS tablet TAKE 1 TABLET BY MOUTH TWO  TIMES DAILY 180 tablet 3  . metoprolol tartrate (LOPRESSOR) 25 MG tablet TAKE 1 AND 1/2 TABLETS BY  MOUTH EVERY MORNING AND 1  TABLET BY MOUTH IN THE  EVENING 225 tablet 3  . Multiple Vitamin (MULTIVITAMIN WITH MINERALS) TABS tablet Take 1 tablet by mouth every evening.    . Multiple Vitamins-Minerals (PRESERVISION AREDS 2) CAPS Take 2 capsules by mouth every evening.    Marland Kitchen  TURMERIC PO Take by mouth.    . vitamin C (ASCORBIC ACID) 500 MG tablet Take 500 mg by mouth daily.     No current facility-administered medications on file prior to visit.    Review of Systems:  As per HPI- otherwise negative.   Physical Examination: Vitals:   11/26/19 1331  BP: 110/70  Pulse: 73  Resp: 16  Temp: (!) 96.1 F (35.6 C)  SpO2: 94%   Vitals:   11/26/19 1331  Weight: 145 lb (65.8 kg)  Height: 5' 1.5" (1.562 m)   Body mass index is 26.95 kg/m. Ideal Body Weight: Weight in (lb) to have BMI = 25: 134.2    GEN: no acute distress.  Looks well  HEENT: Atraumatic, Normocephalic.  Ears and Nose: No external deformity. CV: RRR, No M/G/R. No JVD. No thrill. No extra heart sounds. PULM: CTA B, no wheezes, crackles, rhonchi. No  retractions. No resp. distress. No accessory muscle use. ABD: S, NT, ND, +BS. No rebound. No HSM.  Belly is benign  EXTR: No c/c/e Riding in [facility] today  PSYCH: Normally interactive. Conversant.   VC obtained.  Froze 4 seb Ks' from her face today and one on her left arm  No complications, pt tolerated well   BP Readings from Last 3 Encounters:  11/26/19 110/70  11/12/19 101/66  10/26/19 108/70   FOBT negative today   Assessment and Plan: Gastrointestinal hemorrhage, unspecified gastrointestinal hemorrhage type - Plan: CBC, Comprehensive metabolic panel, IFOBT POC (occult bld, rslt in office), CANCELED: CBC  Seborrheic keratosis  Following up today- we will recheck her CBC stat; if not better or if worse will likely need transfusion.  FOBT negative and her conjunctivae look better so I am hopeful she is improved Will plan further follow- up pending labs.  She will contact me if any problems in the meantime  This visit occurred during the SARS-CoV-2 public health emergency.  Safety protocols were in place, including screening questions prior to the visit, additional usage of staff PPE, and extensive cleaning of exam room while observing appropriate contact time as indicated for disinfecting solutions.    Signed Lamar Blinks, MD  Received her labs, message to pt Mild renal insuf is stable  Hg is improving  Results for orders placed or performed in visit on 11/26/19  CBC  Result Value Ref Range   WBC 8.4 4.0 - 10.5 K/uL   RBC 3.41 (L) 3.87 - 5.11 Mil/uL   Platelets 373.0 150.0 - 400.0 K/uL   Hemoglobin 10.7 (L) 12.0 - 15.0 g/dL   HCT 33.0 (L) 36.0 - 46.0 %   MCV 96.8 78.0 - 100.0 fl   MCHC 32.5 30.0 - 36.0 g/dL   RDW 17.1 (H) 11.5 - 15.5 %  Comprehensive metabolic panel  Result Value Ref Range   Sodium 138 135 - 145 mEq/L   Potassium 4.1 3.5 - 5.1 mEq/L   Chloride 102 96 - 112 mEq/L   CO2 28 19 - 32 mEq/L   Glucose, Bld 118 (H) 70 - 99 mg/dL   BUN 16 6 - 23 mg/dL    Creatinine, Ser 1.07 0.40 - 1.20 mg/dL   Total Bilirubin 0.5 0.2 - 1.2 mg/dL   Alkaline Phosphatase 83 39 - 117 U/L   AST 19 0 - 37 U/L   ALT 17 0 - 35 U/L   Total Protein 6.4 6.0 - 8.3 g/dL   Albumin 3.9 3.5 - 5.2 g/dL   GFR 48.79 (L) >60.00 mL/min  Calcium 9.1 8.4 - 10.5 mg/dL  POCT Occult Blood Stool  Result Value Ref Range   Fecal Occult Blood, POC Negative Negative   Card #1 Date [id number]    Card #2 Fecal Occult Blod, POC     Card #2 Date     Card #3 Fecal Occult Blood, POC     Card #3 Date

## 2019-11-26 ENCOUNTER — Encounter: Payer: Self-pay | Admitting: Family Medicine

## 2019-11-26 ENCOUNTER — Other Ambulatory Visit: Payer: Self-pay

## 2019-11-26 ENCOUNTER — Ambulatory Visit (INDEPENDENT_AMBULATORY_CARE_PROVIDER_SITE_OTHER): Payer: Medicare Other | Admitting: Family Medicine

## 2019-11-26 VITALS — BP 110/70 | HR 73 | Temp 96.1°F | Resp 16 | Ht 61.5 in | Wt 145.0 lb

## 2019-11-26 DIAGNOSIS — K922 Gastrointestinal hemorrhage, unspecified: Secondary | ICD-10-CM

## 2019-11-26 DIAGNOSIS — L821 Other seborrheic keratosis: Secondary | ICD-10-CM

## 2019-11-26 LAB — CBC
HCT: 33 % — ABNORMAL LOW (ref 36.0–46.0)
Hemoglobin: 10.7 g/dL — ABNORMAL LOW (ref 12.0–15.0)
MCHC: 32.5 g/dL (ref 30.0–36.0)
MCV: 96.8 fl (ref 78.0–100.0)
Platelets: 373 10*3/uL (ref 150.0–400.0)
RBC: 3.41 Mil/uL — ABNORMAL LOW (ref 3.87–5.11)
RDW: 17.1 % — ABNORMAL HIGH (ref 11.5–15.5)
WBC: 8.4 10*3/uL (ref 4.0–10.5)

## 2019-11-26 LAB — COMPREHENSIVE METABOLIC PANEL
ALT: 17 U/L (ref 0–35)
AST: 19 U/L (ref 0–37)
Albumin: 3.9 g/dL (ref 3.5–5.2)
Alkaline Phosphatase: 83 U/L (ref 39–117)
BUN: 16 mg/dL (ref 6–23)
CO2: 28 mEq/L (ref 19–32)
Calcium: 9.1 mg/dL (ref 8.4–10.5)
Chloride: 102 mEq/L (ref 96–112)
Creatinine, Ser: 1.07 mg/dL (ref 0.40–1.20)
GFR: 48.79 mL/min — ABNORMAL LOW (ref >60.00)
Glucose, Bld: 118 mg/dL — ABNORMAL HIGH (ref 70–99)
Potassium: 4.1 mEq/L (ref 3.5–5.1)
Sodium: 138 mEq/L (ref 135–145)
Total Bilirubin: 0.5 mg/dL (ref 0.2–1.2)
Total Protein: 6.4 g/dL (ref 6.0–8.3)

## 2019-11-26 LAB — HEMOCCULT GUIAC POC 1CARD (OFFICE)
Card #1 Date: 3252021
Fecal Occult Blood, POC: NEGATIVE

## 2019-11-26 NOTE — Addendum Note (Signed)
Addended by: Wynonia Musty A on: 11/26/2019 04:06 PM   Modules accepted: Orders

## 2019-11-30 NOTE — Telephone Encounter (Signed)
Called pt and scheduled an appt.

## 2019-12-21 DIAGNOSIS — R0600 Dyspnea, unspecified: Secondary | ICD-10-CM | POA: Diagnosis not present

## 2019-12-21 DIAGNOSIS — K5521 Angiodysplasia of colon with hemorrhage: Secondary | ICD-10-CM | POA: Diagnosis not present

## 2019-12-21 DIAGNOSIS — I4891 Unspecified atrial fibrillation: Secondary | ICD-10-CM | POA: Diagnosis not present

## 2019-12-23 ENCOUNTER — Other Ambulatory Visit: Payer: Self-pay

## 2019-12-28 ENCOUNTER — Ambulatory Visit: Payer: Medicare Other | Admitting: Family Medicine

## 2019-12-31 ENCOUNTER — Other Ambulatory Visit: Payer: Self-pay

## 2019-12-31 ENCOUNTER — Encounter: Payer: Self-pay | Admitting: Family Medicine

## 2019-12-31 ENCOUNTER — Ambulatory Visit (INDEPENDENT_AMBULATORY_CARE_PROVIDER_SITE_OTHER): Payer: Medicare Other | Admitting: Family Medicine

## 2019-12-31 VITALS — BP 115/78 | HR 69 | Temp 97.1°F | Resp 18

## 2019-12-31 DIAGNOSIS — I4891 Unspecified atrial fibrillation: Secondary | ICD-10-CM

## 2019-12-31 DIAGNOSIS — K922 Gastrointestinal hemorrhage, unspecified: Secondary | ICD-10-CM | POA: Diagnosis not present

## 2019-12-31 NOTE — Progress Notes (Addendum)
[place] at [facility] [address] [place], [place] [address] [phone]  Date:  12/31/2019   Name:  Wendy Hebert   DOB:  1932/09/09   MRN:  [id number]  PCP:  Wendy Mclean, MD    Chief Complaint: Lab Work   History of Present Illness:  Wendy Hebert is a 86 y.o. very pleasant female patient who presents with the following:  Patient with history of breast cancer, atrial fibrillation.  Here today for periodic follow-up visit/blood count recheck I last saw her on March 25, at which time she had recently been admitted to [facility] with an AVM GI bleed.  She does take Eliquis for history of atrial fib  At that time her hemoglobin was improving, we planned to recheck in about 1 month  In the interim, she saw her cardiologist Dr. Loni Hebert with [facility]-he continue current medications, plan to revisit in 4 months  Covid vaccine is complete Offer Pneumovax booster- per pt this is complete but she had elsewhere   She does not check home BP very often; I mentioned low blood pressure today.  She has not felt weak or lightheaded, she feels fine  She will see her nearly 60 yo grandson in a couple of weeks.  They live in [place] and she is excited to see them     BP Readings from Last 3 Encounters:  12/31/19 115/78  11/26/19 110/70  11/12/19 101/66     Patient Active Problem List   Diagnosis Date Noted  . Osteopenia 08/25/2019  . Malignant neoplasm of upper-inner quadrant of right breast in female, estrogen receptor positive ([place]) 02/16/2019  . Atrial fibrillation ([place]) 10/01/2018  . Gait difficulty 03/20/2018  . Lichen sclerosus [id number]    Past Medical History:  Diagnosis Date  . Arthritis   . Cancer ([place]) 2017   1.6 lumpectomy  . Lichen sclerosus     Past Surgical History:  Procedure Laterality Date  . APPENDECTOMY    . BOWEL RESECTION    . BREAST LUMPECTOMY  2017  . CARDIOVERSION N/A  11/10/2018   Procedure: CARDIOVERSION;  Surgeon: [name], MD;  Location: [facility];  Service: Cardiovascular;  Laterality: N/A;  . CESAREAN SECTION    . HERNIA REPAIR    . TONSILLECTOMY      Social History   Tobacco Use  . Smoking status: Former Research scientist (life sciences)  . Smokeless tobacco: Never Used  . Tobacco comment: 30+ years  Substance Use Topics  . Alcohol use: Yes    Comment: occasionally  . Drug use: Never    Family History  Problem Relation Age of Onset  . Cancer Father        Lung  . Cancer Sister     No Known Allergies  Medication list has been reviewed and updated.  Current Outpatient Medications on File Prior to Visit  Medication Sig Dispense Refill  . Calcium Carb-Cholecalciferol (CALCIUM 600 + D PO) Take 1 tablet by mouth every evening.    . clobetasol cream (TEMOVATE) [id number] % Apply 1 application topically daily.    Marland Kitchen diltiazem (CARDIZEM CD) 180 MG 24 hr capsule Take 180 mg by mouth daily.    Marland Kitchen ELIQUIS 5 MG TABS tablet TAKE 1 TABLET BY MOUTH TWO  TIMES DAILY 180 tablet 3  . metoprolol tartrate (LOPRESSOR) 25 MG tablet TAKE 1 AND 1/2 TABLETS BY  MOUTH EVERY MORNING AND 1  TABLET BY MOUTH  IN THE  EVENING 225 tablet 3  . Multiple Vitamin (MULTIVITAMIN WITH MINERALS) TABS tablet Take 1 tablet by mouth every evening.    . Multiple Vitamins-Minerals (PRESERVISION AREDS 2) CAPS Take 2 capsules by mouth every evening.    . vitamin C (ASCORBIC ACID) 500 MG tablet Take 500 mg by mouth daily.     No current facility-administered medications on file prior to visit.    Review of Systems:  As per HPI- otherwise negative.   Physical Examination: Vitals:   12/31/19 1416 12/31/19 1427  BP: (!) 91/56 115/78  Pulse: 69   Resp: 18   Temp: (!) 97.1 F (36.2 C)   SpO2: 97%    Vitals:   There is no height or weight on file to calculate BMI. Ideal Body Weight:     GEN: no acute distress.  Appears her normal self, sitting in wheelchair as is typical for this  patient She has a few seborrheic keratoses, one on right forehead, one on right cheek, one on crown of her head. HEENT: Atraumatic, Normocephalic.  Ears and Nose: No external deformity. CV: Atrial fibrillation with rate control, No M/G/R. No JVD. No thrill. No extra heart sounds. PULM: CTA B, no wheezes, crackles, rhonchi. No retractions. No resp. distress. No accessory muscle use. EXTR: No c/c/e PSYCH: Normally interactive. Conversant.   BP low- I rechecked manually as above.  Suspect machine read an error due to a fib  Verbal consent obtained Liquid nitrogen used to freeze 3 seborrheic keratoses as above, 3 cycles each.  Patient tolerated procedure well, no blood loss or complications  Assessment and Plan: Gastrointestinal hemorrhage, unspecified gastrointestinal hemorrhage type - Plan: CBC  Atrial fibrillation, unspecified type ([place]) - Plan: Basic metabolic panel  Here today for follow-up visit, recheck CBC.  She is still recovering from a GI bleed which occurred in March.  Patient notes no further blood in her stool, feels that she is gradually getting her strength back.  Await hemoglobin today Atrial fibrillation is rate controlled, she takes anticoagulant. On recheck blood pressure rate is normal, I suspect automatic blood pressure machine made in error due to atrial fibrillation and irregular pulse Moderate medical decision making today This visit occurred during the SARS-CoV-2 public health emergency.  Safety protocols were in place, including screening questions prior to the visit, additional usage of staff PPE, and extensive cleaning of exam room while observing appropriate contact time as indicated for disinfecting solutions.    Signed Lamar Blinks, MD Received her labs 4/30-  Results for orders placed or performed in visit on 12/31/19  CBC  Result Value Ref Range   WBC 9.8 4.0 - 10.5 K/uL   RBC 4.43 3.87 - 5.11 Mil/uL   Platelets 302.0 150.0 - 400.0 K/uL   Hemoglobin  13.3 12.0 - 15.0 g/dL   HCT 40.8 36.0 - 46.0 %   MCV 91.9 78.0 - 100.0 fl   MCHC 32.7 30.0 - 36.0 g/dL   RDW 15.5 11.5 - [id number] %  Basic metabolic panel  Result Value Ref Range   Sodium 140 135 - 145 mEq/L   Potassium 4.2 3.5 - 5.1 mEq/L   Chloride 102 96 - 112 mEq/L   CO2 25 19 - 32 mEq/L   Glucose, Bld 117 (H) 70 - 99 mg/dL   BUN 22 6 - 23 mg/dL   Creatinine, Ser 1.14 0.40 - 1.20 mg/dL   GFR 45.34 (L) >60.00 mL/min   Calcium 9.0 8.4 - 10.5 mg/dL  Message to pt

## 2019-12-31 NOTE — Patient Instructions (Addendum)
It was great to see you again today, as always.  I will be in touch with your labs as soon as possible- hopefully your blood counts continue to improve Assuming all ok we can plan to visit in 4-6 months

## 2020-01-01 ENCOUNTER — Encounter: Payer: Self-pay | Admitting: Family Medicine

## 2020-01-01 LAB — CBC
HCT: 40.8 % (ref 36.0–46.0)
Hemoglobin: 13.3 g/dL (ref 12.0–15.0)
MCHC: 32.7 g/dL (ref 30.0–36.0)
MCV: 91.9 fl (ref 78.0–100.0)
Platelets: 302 10*3/uL (ref 150.0–400.0)
RBC: 4.43 Mil/uL (ref 3.87–5.11)
RDW: 15.5 % (ref 11.5–15.5)
WBC: 9.8 10*3/uL (ref 4.0–10.5)

## 2020-01-01 LAB — BASIC METABOLIC PANEL
BUN: 22 mg/dL (ref 6–23)
CO2: 25 mEq/L (ref 19–32)
Calcium: 9 mg/dL (ref 8.4–10.5)
Chloride: 102 mEq/L (ref 96–112)
Creatinine, Ser: 1.14 mg/dL (ref 0.40–1.20)
GFR: 45.34 mL/min — ABNORMAL LOW (ref >60.00)
Glucose, Bld: 117 mg/dL — ABNORMAL HIGH (ref 70–99)
Potassium: 4.2 mEq/L (ref 3.5–5.1)
Sodium: 140 mEq/L (ref 135–145)

## 2020-01-26 ENCOUNTER — Telehealth: Payer: Self-pay | Admitting: Family Medicine

## 2020-01-26 NOTE — Telephone Encounter (Signed)
Called pt. She fell this am and bumped her head on a curb. She notes a small swollen bump behind her ear.   No LOC No vomiting. She notes a minimal HA "that I would not even notice otherwise" I offered to set up a CT for her outpt today-she declines for now, does not feel like this is necessary.  She does agree to seek immediate care at the ER if she is getting worse in any way

## 2020-01-26 NOTE — Telephone Encounter (Signed)
Called patient, she is feeling ok. She just has a slight headache and a lump is forming on her head she states. No slurred speech, no blurry vision, or sensitivity to light. No bleeding or open wound. However she states she is on eliquis and is needing to know if there is anything she is to do. Advised if headache worsens or symptoms change to go to the ER as soon as possible

## 2020-01-26 NOTE — Telephone Encounter (Signed)
Caller Wendy Hebert  Call Back # [phone]  Patient states fell with morning about 40 mintues ago, Patient hit her head, on concerte curb.  patient was instructed to call PCP. Patient is on eliquis, patient states no symptoms nor bleeding at this time. Patient declined triage nurse.

## 2020-02-02 ENCOUNTER — Telehealth: Payer: Self-pay

## 2020-02-02 NOTE — Telephone Encounter (Signed)
Patient called in to see if Dr. Lorelei Pont would except her husband as a new patient at this time. Please follow up with the patient to advise at [phone]

## 2020-02-03 NOTE — Telephone Encounter (Signed)
Ok to schedule patients husband to see her as New Patient.

## 2020-02-03 NOTE — Telephone Encounter (Signed)
Yes, I will see her husband

## 2020-04-04 ENCOUNTER — Encounter: Payer: Self-pay | Admitting: Family Medicine

## 2020-04-18 ENCOUNTER — Encounter: Payer: Self-pay | Admitting: Family Medicine

## 2020-04-18 NOTE — Telephone Encounter (Signed)
Pasted message to patients chart however he does not have mychart to respond to.

## 2020-04-22 DIAGNOSIS — I4891 Unspecified atrial fibrillation: Secondary | ICD-10-CM | POA: Diagnosis not present

## 2020-04-22 DIAGNOSIS — K5521 Angiodysplasia of colon with hemorrhage: Secondary | ICD-10-CM | POA: Diagnosis not present

## 2020-04-22 DIAGNOSIS — R0600 Dyspnea, unspecified: Secondary | ICD-10-CM | POA: Diagnosis not present

## 2020-04-23 ENCOUNTER — Encounter: Payer: Self-pay | Admitting: Family Medicine

## 2020-05-02 IMAGING — MR MR HEAD W/O CM
10 series · 48 of 48 positions shown · non-contrast
Comparison: None.

CLINICAL DATA: TIA 4 days ago. Confusion and disorientation.

EXAM:
MRI HEAD WITHOUT CONTRAST
MRA HEAD WITHOUT CONTRAST
TECHNIQUE: Multiplanar, multiecho pulse sequences of the brain and surrounding
structures were obtained without intravenous contrast. Angiographic
images of the head were obtained using MRA technique without
contrast.

[Series 2: T1 · sagittal · 5.0mm · 0.47mm/px · 3 of 21 slices shown]
[im 1/21]
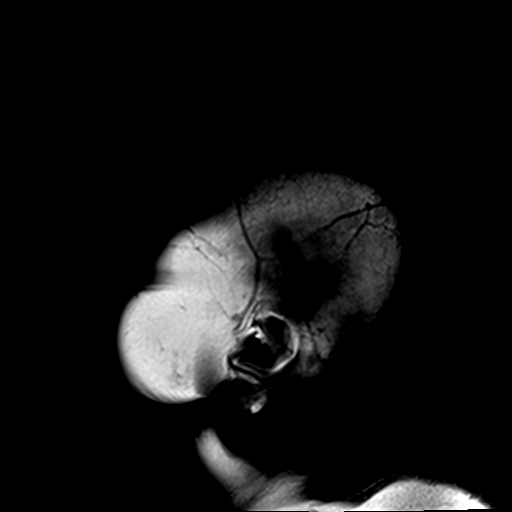
[im 11/21]
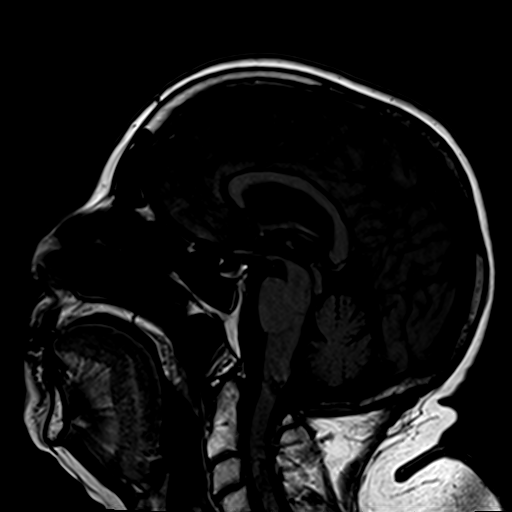
[im 21/21]
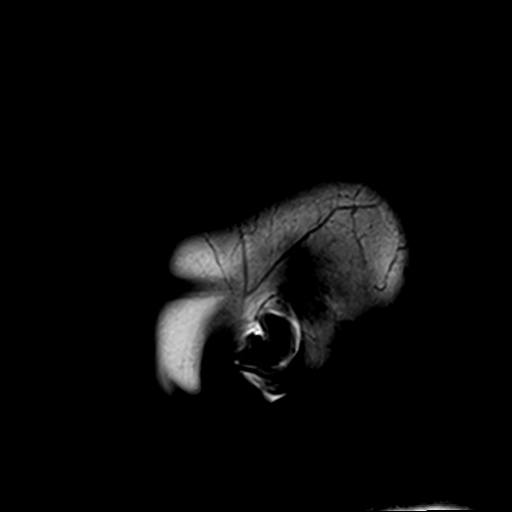

[Series 3: DWI · axial · 3.0mm · 1.80mm/px · z∈[-46,+97]mm · 9 of 106 slices shown (1 of 4)]
[im 1/106]
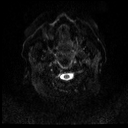
[im 14/106]
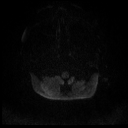
[im 27/106]
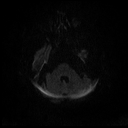
[im 40/106]
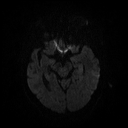
[im 53/106]
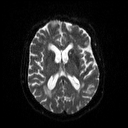
[im 66/106]
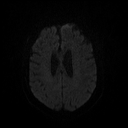
[im 79/106]
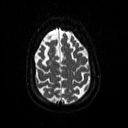
[im 92/106]
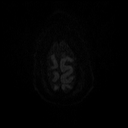
[im 106/106]
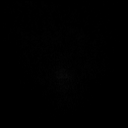

[Series 4: DWI · axial · 3.0mm · 1.80mm/px · z∈[-46,+97]mm · 5 of 54 slices shown (2 of 4)]
[im 1/54]
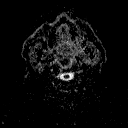
[im 14/54]
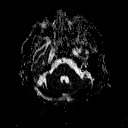
[im 27/54]
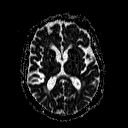
[im 40/54]
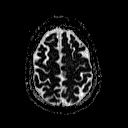
[im 54/54]
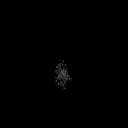

[Series 5: DWI · coronal · 5.0mm · 1.80mm/px · 6 of 71 slices shown (3 of 4)]
[im 1/71]
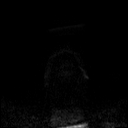
[im 15/71]
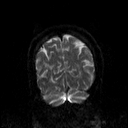
[im 29/71]
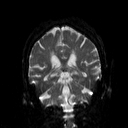
[im 43/71]
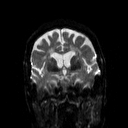
[im 57/71]
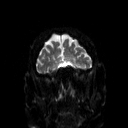
[im 71/71]
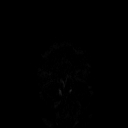

[Series 6: DWI · coronal · 5.0mm · 1.80mm/px · 3 of 36 slices shown (4 of 4)]
[im 1/36]
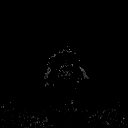
[im 18/36]
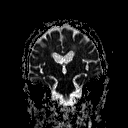
[im 36/36]
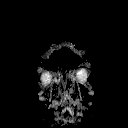

[Series 7: T2 · axial · 5.0mm · 0.51mm/px · z∈[-41,+92]mm · 2 of 22 slices shown (1 of 2)]
[im 1/22]
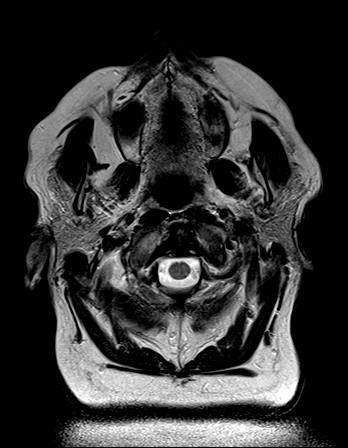
[im 22/22]
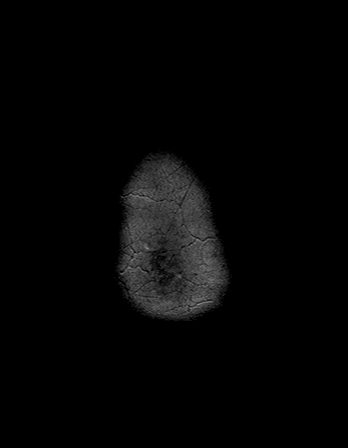

[Series 8: FLAIR · axial · 3.0mm · 0.45mm/px · z∈[-44,+95]mm · 3 of 34 slices shown]
[im 1/34]
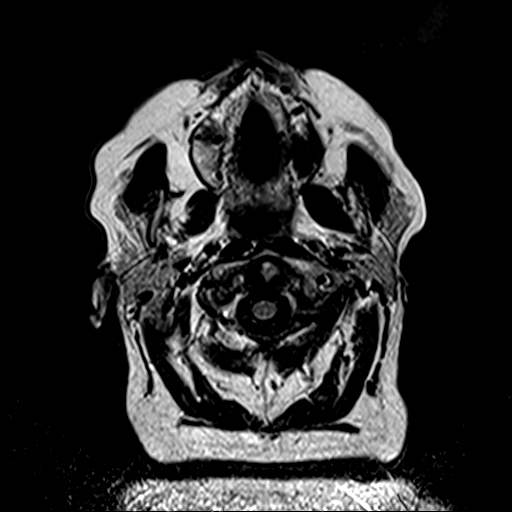
[im 17/34]
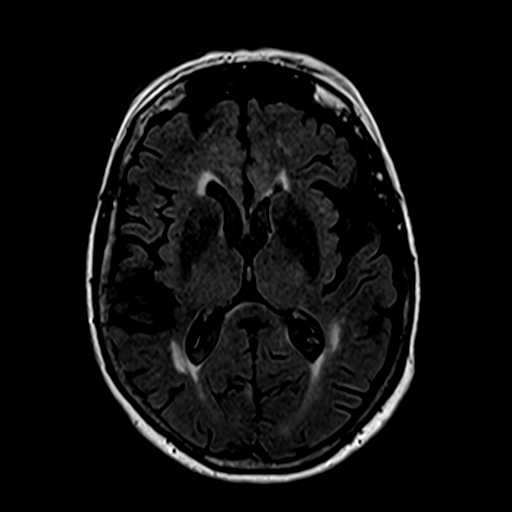
[im 34/34]
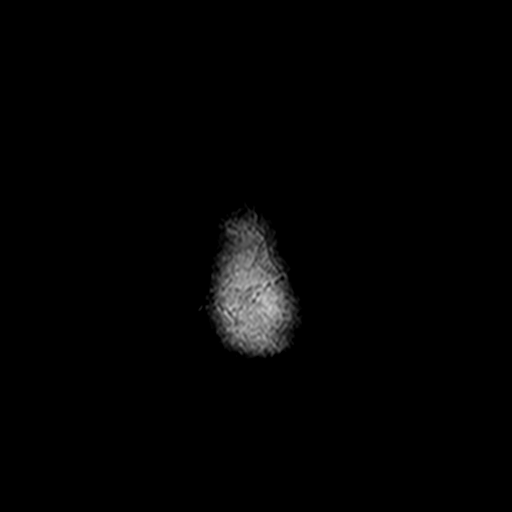

[Series 10: swi_images · axial · 4.0mm · 0.90mm/px · z∈[-45,+96]mm · 3 of 40 slices shown]
[im 1/40]
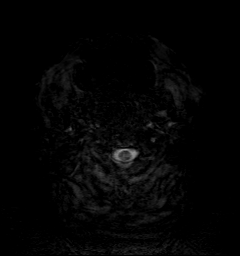
[im 20/40]
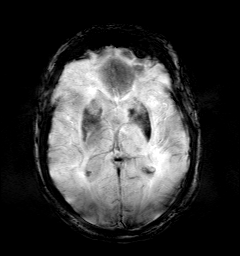
[im 40/40]
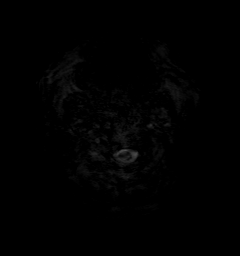

[Series 11: t1_mpr_tra · axial · 1.0mm · 0.71mm/px · z∈[-43,+93]mm · 12 of 144 slices shown]
[im 1/144]
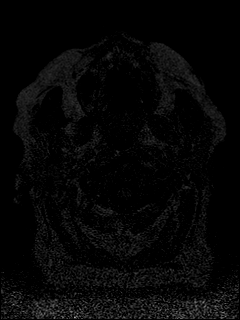
[im 14/144]
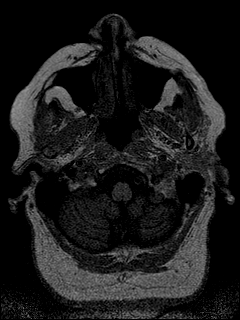
[im 27/144]
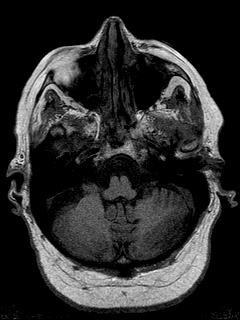
[im 40/144]
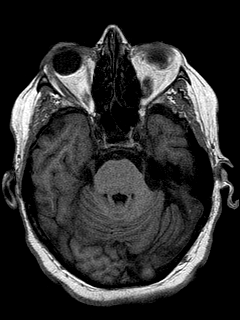
[im 53/144]
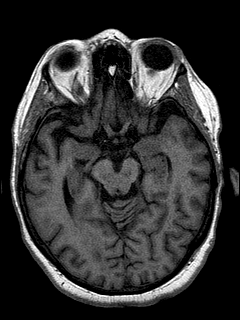
[im 66/144]
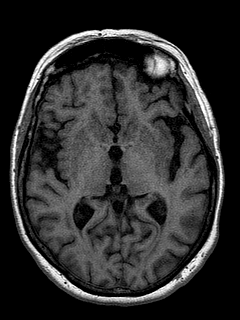
[im 79/144]
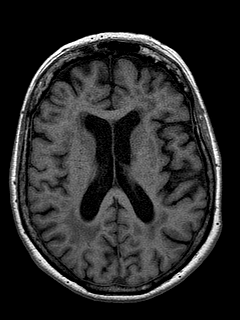
[im 92/144]
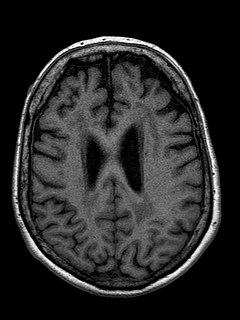
[im 105/144]
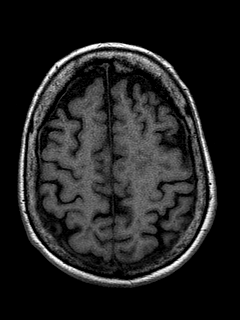
[im 118/144]
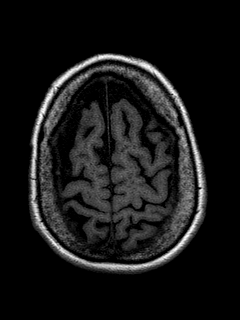
[im 131/144]
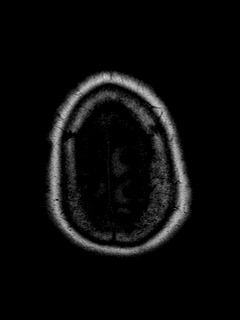
[im 144/144]
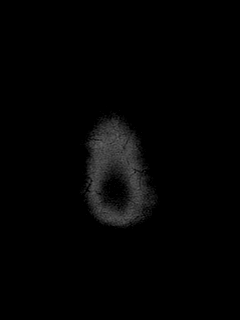

[Series 12: T2 · coronal · 5.0mm · 0.45mm/px · 2 of 28 slices shown (2 of 2)]
[im 1/28]
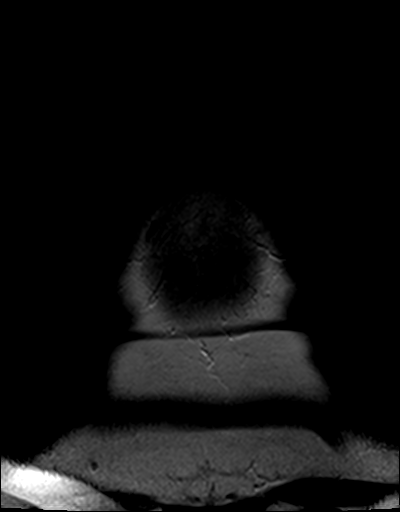
[im 28/28]
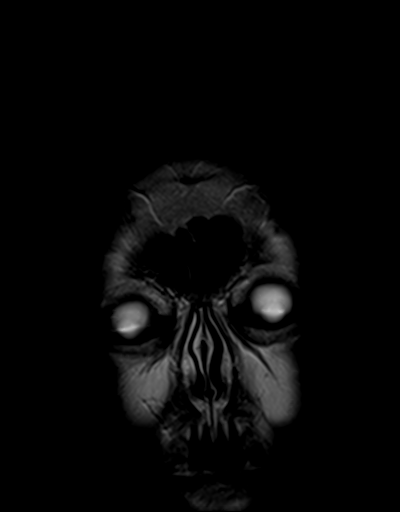

[48 of 48 positions shown; findings below may reference images not displayed]

FINDINGS: MRI HEAD FINDINGS

Brain: Diffusion imaging does not show any acute or subacute
infarction. Brainstem and cerebellum are normal. Cerebral
hemispheres show age related volume loss with moderate chronic
small-vessel ischemic changes of the deep and subcortical white
matter. No cortical or large vessel territory infarction. No mass
lesion, hemorrhage, hydrocephalus or extra-axial collection.

Vascular: Major vessels at the base of the brain show flow.

Skull and upper cervical spine: Negative

Sinuses/Orbits: Clear/normal

Other: None

MRA HEAD FINDINGS

Both internal carotid arteries are widely patent through the skull
base and siphon regions. The anterior and middle cerebral vessels
are patent without proximal stenosis, aneurysm or vascular
malformation.

Both vertebral arteries are patent to the basilar. No basilar
stenosis. Posterior circulation branch vessels show flow.
IMPRESSION: MRI head: No acute or subacute finding. Age related atrophy and
moderate chronic small-vessel ischemic changes of the cerebral
hemispheric white matter.

MRA head: Normal appearance of the large and medium size vessels. No
visible branch vessel occlusion or correctable proximal stenosis.

## 2020-05-05 ENCOUNTER — Encounter: Payer: Self-pay | Admitting: Family Medicine

## 2020-05-05 NOTE — Telephone Encounter (Signed)
Copied to actual patient's chart.

## 2020-06-08 ENCOUNTER — Encounter: Payer: Self-pay | Admitting: Family Medicine

## 2020-06-10 ENCOUNTER — Other Ambulatory Visit: Payer: Self-pay | Admitting: Family Medicine

## 2020-06-10 DIAGNOSIS — I4891 Unspecified atrial fibrillation: Secondary | ICD-10-CM

## 2020-06-22 ENCOUNTER — Encounter: Payer: Self-pay | Admitting: Family Medicine

## 2020-06-23 ENCOUNTER — Encounter: Payer: Self-pay | Admitting: Family Medicine

## 2020-06-27 ENCOUNTER — Encounter: Payer: Self-pay | Admitting: Family Medicine

## 2020-07-08 DIAGNOSIS — Z79899 Other long term (current) drug therapy: Secondary | ICD-10-CM | POA: Diagnosis not present

## 2020-07-08 DIAGNOSIS — M79604 Pain in right leg: Secondary | ICD-10-CM | POA: Diagnosis not present

## 2020-07-08 DIAGNOSIS — M79605 Pain in left leg: Secondary | ICD-10-CM | POA: Diagnosis not present

## 2020-07-09 ENCOUNTER — Encounter: Payer: Self-pay | Admitting: Family Medicine

## 2020-07-09 DIAGNOSIS — M4800 Spinal stenosis, site unspecified: Secondary | ICD-10-CM

## 2020-07-14 MED ORDER — TRAMADOL HCL 50 MG PO TABS: 50.0000 mg | ORAL_TABLET | Freq: Three times a day (TID) | ORAL | 0 refills | Status: DC | PRN

## 2020-07-15 ENCOUNTER — Encounter: Payer: Self-pay | Admitting: Family Medicine

## 2020-07-17 ENCOUNTER — Encounter: Payer: Self-pay | Admitting: Family Medicine

## 2020-07-18 ENCOUNTER — Encounter: Payer: Self-pay | Admitting: Family Medicine

## 2020-07-18 DIAGNOSIS — M79605 Pain in left leg: Secondary | ICD-10-CM | POA: Diagnosis not present

## 2020-07-18 DIAGNOSIS — M79604 Pain in right leg: Secondary | ICD-10-CM | POA: Diagnosis not present

## 2020-07-18 DIAGNOSIS — R2689 Other abnormalities of gait and mobility: Secondary | ICD-10-CM | POA: Diagnosis not present

## 2020-07-18 MED ORDER — CLOBETASOL PROPIONATE 0.05 % EX CREA: 1.0000 "application " | TOPICAL_CREAM | Freq: Every day | CUTANEOUS | 0 refills | Status: DC

## 2020-07-24 ENCOUNTER — Encounter: Payer: Self-pay | Admitting: Family Medicine

## 2020-07-27 ENCOUNTER — Telehealth: Payer: Self-pay

## 2020-07-27 NOTE — Telephone Encounter (Signed)
Received letter that was in box. Placed in providers folder for review.

## 2020-07-27 NOTE — Telephone Encounter (Signed)
Received a letter from patient's financial advisor that was signed by the patient regarding the patient's Annuity Contract with [employer] benefits.  Letter placed in Dr. Lillie Fragmin box for pick up and review.

## 2020-07-28 ENCOUNTER — Encounter: Payer: Self-pay | Admitting: Family Medicine

## 2020-08-01 NOTE — Patient Instructions (Addendum)
It was great to see you again today- I am so sorry that things are hard right now Use the nystatin powder as needed on your groin rash- let me know if not helpful Please see me in 3-4 months to check on how you are doing

## 2020-08-01 NOTE — Progress Notes (Signed)
[facility] [address], [place], [place] [address] [phone]  Date:  08/08/2020   Name:  Wendy Hebert   DOB:  08/14/1933   MRN:  [id number]  PCP:  Darreld Mclean, MD    Chief Complaint: Osteopenia (4-6 month follow up) and Atrial Fibrillation   History of Present Illness:  Wendy Hebert is a 86 y.o. very pleasant female patient who presents with the following:  Patient here today for periodic follow-up visit-history of breast cancer and atrial fibrillation Her cardiologist is Dr. Loni Beckwith with [facility] Last seen by myself in April of this year, although we have exchanged several messages since that time  Angela's biggest concern right now is taking care of her husband [name].  [name] has been suffering with psychological/neurologic changes over the last 6 months or so.  He is under the care of a neurologist.  We are still not really sure what is wrong  She saw a rheumatology in the last month or so, was advised she has spinal stenosis-I have given her some tramadol to use as needed for more severe pain She does not feel like it helps that much unfortunately She is getting help at home about one a week-a woman well bring in groceries and clean for her.  She feels like she is getting enough help at home - she does not need more than once a week help right now  She actually brings in prepared meals that [place] can heat up at home for herself and [name]   Mammogram about 1 year ago- done at [facility].  She is able to contact imaging at [facility] and set this up  Bone density 1 year ago- pt declines today Flu vaccine done COVID-19 booster done  Shingles vaccine She does not wish to have labs today   Her son lives in [place]- she cannot fathom moving there  Arlina lost a 1st cousin just recently which was hard. Her husband is also ill as above, she is afraid that he may die in the next year or so We discussed depression.   Right now Yanelis does not wish to use any medication for same. She is using 2 mg of melatonin at bedtime for sleep, would like [facility] to [facility] She has also been using some old alprazolam that she had on hand.  She would like me to refill this to use as needed for anxiety  She is taking diltiazem, Eliquis, Lopressor for atrial fibrillation Patient Active Problem List   Diagnosis Date Noted  . Osteopenia 08/25/2019  . Malignant neoplasm of upper-inner quadrant of right breast in female, estrogen receptor positive ([place]) 02/16/2019  . Atrial fibrillation ([place]) 10/01/2018  . Gait difficulty 03/20/2018  . Lichen sclerosus 46/80/3212    Past Medical History:  Diagnosis Date  . Arthritis   . Cancer ([place]) 2017   1.6 lumpectomy  . Lichen sclerosus     Past Surgical History:  Procedure Laterality Date  . APPENDECTOMY    . BOWEL RESECTION    . BREAST LUMPECTOMY  2017  . CARDIOVERSION N/A 11/10/2018   Procedure: CARDIOVERSION;  Surgeon: [name], MD;  Location: [facility];  Service: Cardiovascular;  Laterality: N/A;  . CESAREAN SECTION    . HERNIA REPAIR    . TONSILLECTOMY      Social History   Tobacco Use  . Smoking status: Former Research scientist (life sciences)  . Smokeless tobacco: Never Used  .  Tobacco comment: 30+ years  Vaping Use  . Vaping Use: Never used  Substance Use Topics  . Alcohol use: Yes    Comment: occasionally  . Drug use: Never    Family History  Problem Relation Age of Onset  . Cancer Father        Lung  . Cancer Sister     No Known Allergies  Medication list has been reviewed and updated.  Current Outpatient Medications on File Prior to Visit  Medication Sig Dispense Refill  . Calcium Carb-Cholecalciferol (CALCIUM 600 + D PO) Take 1 tablet by mouth every evening.    . clobetasol cream (TEMOVATE) 1.01 % Apply 1 application topically daily. 90 g 0  . diltiazem (CARDIZEM CD) 180 MG 24 hr capsule Take 180 mg by mouth daily.    Marland Kitchen ELIQUIS 5 MG TABS tablet  TAKE 1 TABLET BY MOUTH TWO  TIMES DAILY 180 tablet 3  . metoprolol tartrate (LOPRESSOR) 25 MG tablet TAKE 1 AND 1/2 TABLETS BY  MOUTH IN THE MORNING AND 1  TABLET BY MOUTH IN THE  EVENING 225 tablet 3  . Multiple Vitamin (MULTIVITAMIN WITH MINERALS) TABS tablet Take 1 tablet by mouth every evening.    . Multiple Vitamins-Minerals (PRESERVISION AREDS 2) CAPS Take 2 capsules by mouth every evening.    . traMADol (ULTRAM) 50 MG tablet Take 1 tablet (50 mg total) by mouth every 8 (eight) hours as needed. 20 tablet 0  . vitamin C (ASCORBIC ACID) 500 MG tablet Take 500 mg by mouth daily.     No current facility-administered medications on file prior to visit.    Review of Systems:  As per HPI- otherwise negative.   Physical Examination: Vitals:   08/08/20 1405  BP: 122/68  Pulse: 65  Resp: 15  SpO2: 96%   Vitals:   08/08/20 1405  Weight: 145 lb (65.8 kg)  Height: 5' 1.5" (1.562 m)   Body mass index is 26.95 kg/m. Ideal Body Weight: Weight in (lb) to have BMI = 25: 134.2  GEN: no acute distress.    Minimal overweight, elderly woman sitting in wheelchair HEENT: Atraumatic, Normocephalic.  Ears and Nose: No external deformity. CV: Rate controlled A. fib, No M/G/R. No JVD. No thrill. No extra heart sounds. PULM: CTA B, no wheezes, crackles, rhonchi. No retractions. No resp. distress. No accessory muscle use. ABD: S, NT, ND, +BS. No rebound. No HSM. EXTR: No c/c/e PSYCH: Normally interactive. Conversant.  She has Candida intertrigo in the groin folds   Assessment and Plan: Candidal intertrigo - Plan: nystatin (MYCOSTATIN/NYSTOP) powder  Malignant neoplasm of upper-inner quadrant of right breast in female, estrogen receptor positive (HCC)  Anxiety - Plan: ALPRAZolam (XANAX) 0.5 MG tablet  Adjustment insomnia - Plan: Melatonin 1 MG CAPS  Caregiver stress  Patient today with a few concerns.  Advised that she should go ahead and get a mammogram, she will set this up through  [facility] has very little family left alive.  Her son lives in [place], she does not think she can move herself and [name] to [place].  She is starting to mentally prepare for her husband to eventually pass away. She is feeling fairly anxious and stressed.  Declines medication for depression right now, would like me to refill alprazolam for her to use as needed.  I will also fill melatonin she is using for sleep We discussed the amount of help she is getting at home, right now she feels  this is adequate.  I have encouraged her to please let me know if I can do anything else to help  We will have her treat Candida intertrigo with nystatin powder She declines labs today  Asked her to see me in 3 to 4 months for follow-up This visit occurred during the SARS-CoV-2 public health emergency.  Safety protocols were in place, including screening questions prior to the visit, additional usage of staff PPE, and extensive cleaning of exam room while observing appropriate contact time as indicated for disinfecting solutions.    Signed Lamar Blinks, MD

## 2020-08-02 ENCOUNTER — Encounter: Payer: Self-pay | Admitting: Family Medicine

## 2020-08-03 ENCOUNTER — Encounter: Payer: Self-pay | Admitting: Family Medicine

## 2020-08-03 NOTE — Telephone Encounter (Signed)
Sent message in patients correct chart for proper documentation.

## 2020-08-08 ENCOUNTER — Encounter: Payer: Self-pay | Admitting: Family Medicine

## 2020-08-08 ENCOUNTER — Other Ambulatory Visit: Payer: Self-pay

## 2020-08-08 ENCOUNTER — Ambulatory Visit (INDEPENDENT_AMBULATORY_CARE_PROVIDER_SITE_OTHER): Payer: Medicare Other | Admitting: Family Medicine

## 2020-08-08 ENCOUNTER — Encounter: Payer: Self-pay | Admitting: Oncology

## 2020-08-08 VITALS — BP 122/68 | HR 65 | Resp 15 | Ht 61.5 in | Wt 145.0 lb

## 2020-08-08 DIAGNOSIS — F419 Anxiety disorder, unspecified: Secondary | ICD-10-CM | POA: Diagnosis not present

## 2020-08-08 DIAGNOSIS — F5102 Adjustment insomnia: Secondary | ICD-10-CM

## 2020-08-08 DIAGNOSIS — B372 Candidiasis of skin and nail: Secondary | ICD-10-CM

## 2020-08-08 DIAGNOSIS — Z636 Dependent relative needing care at home: Secondary | ICD-10-CM | POA: Diagnosis not present

## 2020-08-08 DIAGNOSIS — C50211 Malignant neoplasm of upper-inner quadrant of right female breast: Secondary | ICD-10-CM | POA: Diagnosis not present

## 2020-08-08 DIAGNOSIS — Z170 Estrogen receptor positive status [ER+]: Secondary | ICD-10-CM

## 2020-08-08 MED ORDER — NYSTATIN 100000 UNIT/GM EX POWD: 1.0000 "application " | Freq: Three times a day (TID) | CUTANEOUS | 1 refills | Status: DC

## 2020-08-08 MED ORDER — MELATONIN 1 MG PO CAPS: 2.0000 mg | ORAL_CAPSULE | Freq: Every day | ORAL | 3 refills | Status: DC

## 2020-08-08 MED ORDER — ALPRAZOLAM 0.5 MG PO TABS: 0.2500 mg | ORAL_TABLET | Freq: Two times a day (BID) | ORAL | 1 refills | Status: DC | PRN

## 2020-08-09 ENCOUNTER — Telehealth: Payer: Self-pay | Admitting: Oncology

## 2020-08-09 ENCOUNTER — Other Ambulatory Visit: Payer: Self-pay | Admitting: Oncology

## 2020-08-09 DIAGNOSIS — M79604 Pain in right leg: Secondary | ICD-10-CM | POA: Diagnosis not present

## 2020-08-09 DIAGNOSIS — R2689 Other abnormalities of gait and mobility: Secondary | ICD-10-CM | POA: Diagnosis not present

## 2020-08-09 DIAGNOSIS — M79605 Pain in left leg: Secondary | ICD-10-CM | POA: Diagnosis not present

## 2020-08-09 NOTE — Progress Notes (Signed)
Patient called asking if she really needs mammography given her "age and mobility issues".  Actually as she has her husband hands full right now taking care of her husband.  I think it would be better to simply put off any further evaluation including visits until April or March and I called her and left her a note saying that.  I also explained how to call and let us know if she does need to see [place] before then.

## 2020-08-09 NOTE — Telephone Encounter (Signed)
R/s appt per 12/7 sch msg - left message for patient with apt date and time

## 2020-08-11 ENCOUNTER — Encounter: Payer: Self-pay | Admitting: Family Medicine

## 2020-08-12 ENCOUNTER — Encounter: Payer: Self-pay | Admitting: Family Medicine

## 2020-08-14 ENCOUNTER — Encounter: Payer: Self-pay | Admitting: Family Medicine

## 2020-08-16 DIAGNOSIS — H26493 Other secondary cataract, bilateral: Secondary | ICD-10-CM | POA: Diagnosis not present

## 2020-08-16 DIAGNOSIS — H353132 Nonexudative age-related macular degeneration, bilateral, intermediate dry stage: Secondary | ICD-10-CM | POA: Diagnosis not present

## 2020-08-19 DIAGNOSIS — M25561 Pain in right knee: Secondary | ICD-10-CM | POA: Diagnosis not present

## 2020-08-20 ENCOUNTER — Encounter: Payer: Self-pay | Admitting: Family Medicine

## 2020-08-23 DIAGNOSIS — R2689 Other abnormalities of gait and mobility: Secondary | ICD-10-CM | POA: Diagnosis not present

## 2020-08-23 DIAGNOSIS — M79605 Pain in left leg: Secondary | ICD-10-CM | POA: Diagnosis not present

## 2020-08-23 DIAGNOSIS — M79604 Pain in right leg: Secondary | ICD-10-CM | POA: Diagnosis not present

## 2020-08-24 ENCOUNTER — Inpatient Hospital Stay: Payer: Medicare Other

## 2020-08-24 ENCOUNTER — Inpatient Hospital Stay: Payer: Medicare Other | Admitting: Oncology

## 2020-09-03 ENCOUNTER — Encounter: Payer: Self-pay | Admitting: Family Medicine

## 2020-09-03 DIAGNOSIS — I4891 Unspecified atrial fibrillation: Secondary | ICD-10-CM

## 2020-09-05 MED ORDER — METOPROLOL TARTRATE 25 MG PO TABS: ORAL_TABLET | ORAL | 3 refills | Status: DC

## 2020-09-11 ENCOUNTER — Encounter: Payer: Self-pay | Admitting: Family Medicine

## 2020-09-26 ENCOUNTER — Encounter: Payer: Self-pay | Admitting: Family Medicine

## 2020-10-12 ENCOUNTER — Encounter: Payer: Self-pay | Admitting: Family Medicine

## 2020-10-14 ENCOUNTER — Encounter: Payer: Self-pay | Admitting: Family Medicine

## 2020-10-28 ENCOUNTER — Encounter: Payer: Self-pay | Admitting: Family Medicine

## 2020-10-28 DIAGNOSIS — Z13220 Encounter for screening for lipoid disorders: Secondary | ICD-10-CM

## 2020-10-28 DIAGNOSIS — I4891 Unspecified atrial fibrillation: Secondary | ICD-10-CM

## 2020-10-31 DIAGNOSIS — R0600 Dyspnea, unspecified: Secondary | ICD-10-CM | POA: Diagnosis not present

## 2020-10-31 DIAGNOSIS — K5521 Angiodysplasia of colon with hemorrhage: Secondary | ICD-10-CM | POA: Diagnosis not present

## 2020-10-31 DIAGNOSIS — I4891 Unspecified atrial fibrillation: Secondary | ICD-10-CM | POA: Diagnosis not present

## 2020-11-01 NOTE — Progress Notes (Deleted)
Wendy Hebert at [facility] [address] [place], [place] [address] [phone]  Date:  11/03/2020   Name:  Wendy Hebert   DOB:  [birth year]   MRN:  [id number]  PCP:  Darreld Mclean, MD    Chief Complaint: No chief complaint on file.   History of Present Illness:  Wendy Hebert is a 87 y.o. very pleasant female patient who presents with the following:  Pt here today for a follow-up visit - history of breast cancer and atrial fibrillation She recently saw her cardiologist who was concerned about possible recurrent anemia   Patient Active Problem List   Diagnosis Date Noted  . Osteopenia 08/25/2019  . Malignant neoplasm of upper-inner quadrant of right breast in female, estrogen receptor positive ([place]) 02/16/2019  . Atrial fibrillation ([place]) 10/01/2018  . Gait difficulty 03/20/2018  . Lichen sclerosus 42/70/6237    Past Medical History:  Diagnosis Date  . Arthritis   . Cancer ([place]) 2017   1.6 lumpectomy  . Lichen sclerosus     Past Surgical History:  Procedure Laterality Date  . APPENDECTOMY    . BOWEL RESECTION    . BREAST LUMPECTOMY  2017  . CARDIOVERSION N/A 11/10/2018   Procedure: CARDIOVERSION;  Surgeon: [name], MD;  Location: [facility];  Service: Cardiovascular;  Laterality: N/A;  . CESAREAN SECTION    . HERNIA REPAIR    . TONSILLECTOMY      Social History   Tobacco Use  . Smoking status: Former Research scientist (life sciences)  . Smokeless tobacco: Never Used  . Tobacco comment: 30+ years  Vaping Use  . Vaping Use: Never used  Substance Use Topics  . Alcohol use: Yes    Comment: occasionally  . Drug use: Never    Family History  Problem Relation Age of Onset  . Cancer Father        Lung  . Cancer Sister     No Known Allergies  Medication list has been reviewed and updated.  Current Outpatient Medications on File Prior to Visit  Medication Sig Dispense Refill  . ALPRAZolam (XANAX) 0.5 MG tablet  Take 0.5-1 tablets (0.25-0.5 mg total) by mouth 2 (two) times daily as needed for anxiety. 60 tablet 1  . Calcium Carb-Cholecalciferol (CALCIUM 600 + D PO) Take 1 tablet by mouth every evening.    . clobetasol cream (TEMOVATE) 6.28 % Apply 1 application topically daily. 90 g 0  . diltiazem (CARDIZEM CD) 180 MG 24 hr capsule Take 180 mg by mouth daily.    Marland Kitchen ELIQUIS 5 MG TABS tablet TAKE 1 TABLET BY MOUTH TWO  TIMES DAILY 180 tablet 3  . Melatonin 1 MG CAPS Take 2 capsules (2 mg total) by mouth at bedtime. 180 capsule 3  . metoprolol tartrate (LOPRESSOR) 25 MG tablet TAKE 1 AND 1/2 TABLETS BY  MOUTH IN THE MORNING AND 1  TABLET BY MOUTH IN THE  EVENING 225 tablet 3  . Multiple Vitamin (MULTIVITAMIN WITH MINERALS) TABS tablet Take 1 tablet by mouth every evening.    . Multiple Vitamins-Minerals (PRESERVISION AREDS 2) CAPS Take 2 capsules by mouth every evening.    . nystatin (MYCOSTATIN/NYSTOP) powder Apply 1 application topically 3 (three) times daily. 60 g 1  . traMADol (ULTRAM) 50 MG tablet Take 1 tablet (50 mg total) by mouth every 8 (eight) hours as needed. 20 tablet 0  . vitamin C (ASCORBIC ACID) 500 MG tablet Take 500 mg  by mouth daily.     No current facility-administered medications on file prior to visit.    Review of Systems:  As per HPI- otherwise negative.   Physical Examination: There were no vitals filed for this visit. There were no vitals filed for this visit. There is no height or weight on file to calculate BMI. Ideal Body Weight:    GEN: no acute distress. HEENT: Atraumatic, Normocephalic.  Ears and Nose: No external deformity. CV: RRR, No M/G/R. No JVD. No thrill. No extra heart sounds. PULM: CTA B, no wheezes, crackles, rhonchi. No retractions. No resp. distress. No accessory muscle use. ABD: S, NT, ND, +BS. No rebound. No HSM. EXTR: No c/c/e PSYCH: Normally interactive. Conversant.    Assessment and Plan: *** This visit occurred during the SARS-CoV-2 public  health emergency.  Safety protocols were in place, including screening questions prior to the visit, additional usage of staff PPE, and extensive cleaning of exam room while observing appropriate contact time as indicated for disinfecting solutions.    Signed Lamar Blinks, MD

## 2020-11-02 ENCOUNTER — Encounter: Payer: Self-pay | Admitting: Family Medicine

## 2020-11-02 NOTE — Progress Notes (Addendum)
[place] at [facility] [address] [place], [place] [address] [phone]  Date:  11/03/2020   Name:  Wendy Hebert   DOB:  1933-04-10   MRN:  [id number]  PCP:  Darreld Mclean, MD    Chief Complaint: Follow-up   History of Present Illness:  Wendy Hebert is a 87 y.o. very pleasant female patient who presents with the following:  Here today for a follow-up visit Last seen by myself in December  -history of breast cancer and atrial fibrillation Her cardiologist is Dr. Loni Beckwith with [facility] She is in permanent A. fib and anticoagulated She reports that Dr. Elonda Husky would like to check her blood counts, we can certainly do this for her today.  She has been anemic from a GI bleed in the past, they want to make sure this is not occurring again-she does not have any signs of blood in the stool at this time Mammogram- pt does not wish to continue at age 59 which is reasonable  Most recent labs April 2021  Eliquis Metoprolol Diltiazem  Married to [name] who is suffering from significant mental and physical health problems at this time.  He is not willing to come and see me unfortunately for a recheck right now.  Preslie is doing the best she can to care for him.  Their 1 child, a son lives in [place].  He does visit them periodically and tries to help  Patient Active Problem List   Diagnosis Date Noted  . Osteopenia 08/25/2019  . Malignant neoplasm of upper-inner quadrant of right breast in female, estrogen receptor positive ([place]) 02/16/2019  . Atrial fibrillation ([place]) 10/01/2018  . Gait difficulty 03/20/2018  . Lichen sclerosus 89/21/1941    Past Medical History:  Diagnosis Date  . Arthritis   . Cancer ([place]) 2017   1.6 lumpectomy  . Lichen sclerosus     Past Surgical History:  Procedure Laterality Date  . APPENDECTOMY    . BOWEL RESECTION    . BREAST LUMPECTOMY  2017  . CARDIOVERSION N/A 11/10/2018    Procedure: CARDIOVERSION;  Surgeon: [name], MD;  Location: [facility];  Service: Cardiovascular;  Laterality: N/A;  . CESAREAN SECTION    . HERNIA REPAIR    . TONSILLECTOMY      Social History   Tobacco Use  . Smoking status: Former Research scientist (life sciences)  . Smokeless tobacco: Never Used  . Tobacco comment: 30+ years  Vaping Use  . Vaping Use: Never used  Substance Use Topics  . Alcohol use: Yes    Comment: occasionally  . Drug use: Never    Family History  Problem Relation Age of Onset  . Cancer Father        Lung  . Cancer Sister     No Known Allergies  Medication list has been reviewed and updated.  Current Outpatient Medications on File Prior to Visit  Medication Sig Dispense Refill  . ALPRAZolam (XANAX) 0.5 MG tablet Take 0.5-1 tablets (0.25-0.5 mg total) by mouth 2 (two) times daily as needed for anxiety. 60 tablet 1  . clobetasol cream (TEMOVATE) 7.40 % Apply 1 application topically daily. 90 g 0  . diltiazem (CARDIZEM CD) 180 MG 24 hr capsule Take 180 mg by mouth daily.    Marland Kitchen ELIQUIS 5 MG TABS tablet TAKE 1 TABLET BY MOUTH TWO  TIMES DAILY 180 tablet 3  . metoprolol tartrate (LOPRESSOR) 25 MG tablet TAKE  1 AND 1/2 TABLETS BY  MOUTH IN THE MORNING AND 1  TABLET BY MOUTH IN THE  EVENING 225 tablet 3  . traMADol (ULTRAM) 50 MG tablet Take 1 tablet (50 mg total) by mouth every 8 (eight) hours as needed. 20 tablet 0  . vitamin C (ASCORBIC ACID) 500 MG tablet Take 500 mg by mouth daily.    . Calcium Carb-Cholecalciferol (CALCIUM 600 + D PO) Take 1 tablet by mouth every evening. (Patient not taking: Reported on 11/03/2020)    . Melatonin 1 MG CAPS Take 2 capsules (2 mg total) by mouth at bedtime. (Patient not taking: Reported on 11/03/2020) 180 capsule 3  . Multiple Vitamin (MULTIVITAMIN WITH MINERALS) TABS tablet Take 1 tablet by mouth every evening. (Patient not taking: Reported on 11/03/2020)    . Multiple Vitamins-Minerals (PRESERVISION AREDS 2) CAPS Take 2 capsules by mouth  every evening. (Patient not taking: Reported on 11/03/2020)     No current facility-administered medications on file prior to visit.    Review of Systems:  As per HPI- otherwise negative.   Physical Examination: Vitals:   11/03/20 1431  BP: 118/76  Pulse: 86  Resp: 18  Temp: 97.6 F (36.4 C)  SpO2: 98%   Vitals:   11/03/20 1431  Weight: 140 lb (63.5 kg)  Height: 5' 1.5" (1.562 m)   Body mass index is 26.02 kg/m. Ideal Body Weight: Weight in (lb) to have BMI = 25: 134.2  GEN: no acute distress. Mild overweight. Looks well and her normal self HEENT: Atraumatic, Normocephalic.  Ears and Nose: No external deformity. CV: Rate controlled A. fib, No M/G/R. No JVD. No thrill. No extra heart sounds. PULM: CTA B, no wheezes, crackles, rhonchi. No retractions. No resp. distress. No accessory muscle use. ABD: S, NT, ND, +BS. No rebound. No HSM. EXTR: No c/c/e PSYCH: Normally interactive. Conversant.  The patient has several seborrheic keratoses that she would like frozen, on her back, left forehead, right leg  Verbal consent obtained.  Liquid nitrogen used for cryotherapy to approximately 12 seborrheic keratoses x3 cycles each.  Patient tolerated well with no immediate complications   Assessment and Plan: Screening for hyperlipidemia - Plan: Lipid panel  Malignant neoplasm of upper-inner quadrant of right breast in female, estrogen receptor positive ([place])  Seborrheic keratosis  Caregiver stress  Atrial fibrillation, unspecified type ([place]) - Plan: CBC, Comprehensive metabolic panel, TSH  Patient is here today for follow-up visit.  Cryotherapy to seborrheic keratoses as above Offer support in her caregiving for her husband [name].  At this point Litha is fairly resigned, she cannot force him to see the doctor or take his medication.  I advised that we are willing to see him in person or virtually at any time  She continues to see cardiology for atrial fibrillation, labs are  pending as above  She does have history of breast cancer, but no longer wishes to undergo screening due to her age  Will plan further follow- up pending labs. Cryotherapy to seborrheic keratoses This visit occurred during the SARS-CoV-2 public health emergency.  Safety protocols were in place, including screening questions prior to the visit, additional usage of staff PPE, and extensive cleaning of exam room while observing appropriate contact time as indicated for disinfecting solutions.    Signed Lamar Blinks, MD  3/4- received labs as below, message to pt  Results for orders placed or performed in visit on 11/03/20  CBC  Result Value Ref Range   WBC 8.7 4.0 -  10.5 K/uL   RBC 4.42 3.87 - 5.11 Mil/uL   Platelets 276.0 150.0 - 400.0 K/uL   Hemoglobin 14.1 12.0 - 15.0 g/dL   HCT 42.6 36.0 - 46.0 %   MCV 96.3 78.0 - 100.0 fl   MCHC 33.2 30.0 - 36.0 g/dL   RDW 14.9 11.5 - 15.5 %  Comprehensive metabolic panel  Result Value Ref Range   Sodium 138 135 - 145 mEq/L   Potassium 4.2 3.5 - 5.1 mEq/L   Chloride 105 96 - 112 mEq/L   CO2 25 19 - 32 mEq/L   Glucose, Bld 107 (H) 70 - 99 mg/dL   BUN 20 6 - 23 mg/dL   Creatinine, Ser 1.05 0.40 - 1.20 mg/dL   Total Bilirubin 0.8 0.2 - 1.2 mg/dL   Alkaline Phosphatase 89 39 - 117 U/L   AST 20 0 - 37 U/L   ALT 15 0 - 35 U/L   Total Protein 6.2 6.0 - 8.3 g/dL   Albumin 3.9 3.5 - 5.2 g/dL   GFR 48.45 (L) >60.00 mL/min   Calcium 8.9 8.4 - 10.5 mg/dL  TSH  Result Value Ref Range   TSH 2.55 0.35 - 4.50 uIU/mL  Lipid panel  Result Value Ref Range   Cholesterol 158 0 - 200 mg/dL   Triglycerides 238.0 (H) 0.0 - 149.0 mg/dL   HDL 57.80 >39.00 mg/dL   VLDL 47.6 (H) 0.0 - 40.0 mg/dL   Total CHOL/HDL Ratio 3    NonHDL 100.48   LDL cholesterol, direct  Result Value Ref Range   Direct LDL 84.0 mg/dL

## 2020-11-03 ENCOUNTER — Ambulatory Visit: Payer: Medicare Other | Admitting: Family Medicine

## 2020-11-03 ENCOUNTER — Ambulatory Visit (INDEPENDENT_AMBULATORY_CARE_PROVIDER_SITE_OTHER): Payer: Medicare Other | Admitting: Family Medicine

## 2020-11-03 ENCOUNTER — Encounter: Payer: Self-pay | Admitting: Family Medicine

## 2020-11-03 ENCOUNTER — Other Ambulatory Visit: Payer: Self-pay

## 2020-11-03 VITALS — BP 118/76 | HR 86 | Temp 97.6°F | Resp 18 | Ht 61.5 in | Wt 140.0 lb

## 2020-11-03 DIAGNOSIS — Z13220 Encounter for screening for lipoid disorders: Secondary | ICD-10-CM

## 2020-11-03 DIAGNOSIS — C50211 Malignant neoplasm of upper-inner quadrant of right female breast: Secondary | ICD-10-CM | POA: Diagnosis not present

## 2020-11-03 DIAGNOSIS — L821 Other seborrheic keratosis: Secondary | ICD-10-CM | POA: Diagnosis not present

## 2020-11-03 DIAGNOSIS — Z636 Dependent relative needing care at home: Secondary | ICD-10-CM

## 2020-11-03 DIAGNOSIS — Z170 Estrogen receptor positive status [ER+]: Secondary | ICD-10-CM | POA: Diagnosis not present

## 2020-11-03 DIAGNOSIS — I4891 Unspecified atrial fibrillation: Secondary | ICD-10-CM | POA: Diagnosis not present

## 2020-11-03 NOTE — Patient Instructions (Addendum)
It was good to see you again today, as always.  I will be in touch with your labs asap Take care, let me know if I can do anything to help with Wendy Hebert

## 2020-11-04 ENCOUNTER — Encounter: Payer: Self-pay | Admitting: Family Medicine

## 2020-11-04 LAB — COMPREHENSIVE METABOLIC PANEL
ALT: 15 U/L (ref 0–35)
AST: 20 U/L (ref 0–37)
Albumin: 3.9 g/dL (ref 3.5–5.2)
Alkaline Phosphatase: 89 U/L (ref 39–117)
BUN: 20 mg/dL (ref 6–23)
CO2: 25 mEq/L (ref 19–32)
Calcium: 8.9 mg/dL (ref 8.4–10.5)
Chloride: 105 mEq/L (ref 96–112)
Creatinine, Ser: 1.05 mg/dL (ref 0.40–1.20)
GFR: 48.45 mL/min — ABNORMAL LOW (ref >60.00)
Glucose, Bld: 107 mg/dL — ABNORMAL HIGH (ref 70–99)
Potassium: 4.2 mEq/L (ref 3.5–5.1)
Sodium: 138 mEq/L (ref 135–145)
Total Bilirubin: 0.8 mg/dL (ref 0.2–1.2)
Total Protein: 6.2 g/dL (ref 6.0–8.3)

## 2020-11-04 LAB — LDL CHOLESTEROL, DIRECT: Direct LDL: 84 mg/dL

## 2020-11-04 LAB — LIPID PANEL
Cholesterol: 158 mg/dL (ref 0–200)
HDL: 57.8 mg/dL (ref >39.00)
NonHDL: 100.48
Total CHOL/HDL Ratio: 3
Triglycerides: 238 mg/dL — ABNORMAL HIGH (ref 0.0–149.0)
VLDL: 47.6 mg/dL — ABNORMAL HIGH (ref 0.0–40.0)

## 2020-11-04 LAB — TSH: TSH: 2.55 u[IU]/mL (ref 0.35–4.50)

## 2020-11-04 LAB — CBC
HCT: 42.6 % (ref 36.0–46.0)
Hemoglobin: 14.1 g/dL (ref 12.0–15.0)
MCHC: 33.2 g/dL (ref 30.0–36.0)
MCV: 96.3 fl (ref 78.0–100.0)
Platelets: 276 10*3/uL (ref 150.0–400.0)
RBC: 4.42 Mil/uL (ref 3.87–5.11)
RDW: 14.9 % (ref 11.5–15.5)
WBC: 8.7 10*3/uL (ref 4.0–10.5)

## 2020-11-08 ENCOUNTER — Encounter: Payer: Self-pay | Admitting: Family Medicine

## 2020-11-18 ENCOUNTER — Other Ambulatory Visit: Payer: Self-pay

## 2020-11-18 DIAGNOSIS — Z170 Estrogen receptor positive status [ER+]: Secondary | ICD-10-CM

## 2020-11-21 NOTE — Progress Notes (Signed)
[facility]  [phone] [phone]    ID: Wendy Hebert DOB: 06/17/1933  MR#: [id number]  AYT#:[id number]  Patient Care Team: Darreld Mclean, MD as PCP - General (Family Medicine) Sherran Needs, NP as Nurse Practitioner (Nurse Practitioner) Magrinat, Virgie Dad, MD as Consulting Physician (Oncology) Modesto Charon, PA-C as Physician Assistant (Obstetrics and Gynecology) Marcy Panning, MD as Referring Physician (Oncology) Hermelinda Medicus, MD as Consulting Physician (Internal Medicine) Beverely Pace, MD as Consulting Physician (Surgical Oncology) OTHER MD:   CHIEF COMPLAINT: Estrogen receptor positive breast cancer  CURRENT TREATMENT: Observation   INTERVAL HISTORY: Wendy Hebert returns today for follow up of her estrogen receptor positive breast cancer.  She continues under observation alone.  She is due for mammography --last mammogram I have on record is from 08/04/2019--and there is an order in place from Dr. Edilia Bo, but Bev wonders if at her age and with all her problems she needs mammography and at this point she does not wish to proceed with that test.  REVIEW OF SYSTEMS: Wendy Hebert tells me the biggest problem she has is that she cannot move.  She cannot do any gardening.  They had to get a stair lift so she could get to the upstairs.  She is using a cane.  She does have a walker but does not use that.  There have been no recent falls.  She says quite aside from that issue she has no time for herself since her husband has significantly deteriorated.  They have visited a neurologist but no definitive diagnosis or prognosis was made.  She says her son came to visit last night but has continued to work virtually and so far has not been much help.   COVID 19 VACCINATION STATUS: Status post Pfizer x2 with booster October 2021   HISTORY OF CURRENT ILLNESS: From the original intake note:  Wendy Hebert was referred by Dr. Janett Billow Copland  for continuing breast cancer surveillance.   The patient originally was found to have a right breast abnormality on screening mammography August 2017.  Additional views confirmed a focal distortion in the inner right breast and ultrasound found a 1.4 cm irregular hypoechoic mass at the 3 o'clock position 5 cm from the nipple. There were no abnormal right axillary lymph nodes noted.   Biopsy under Dr. Genoveva Ill 05/11/2016 showed ([place] [id number]) invasive ductal carcinoma, grade 2, estrogen receptor and progesterone receptor both 99% positive with strong staining intensity, HER-2 equivocal at 2+, with FISH attempts failed x2.  The patient then proceeded to lumpectomy and sentinel lymph node biopsy ([facility]PS [id number]) on 05/24/2016 for a 1.4 cm invasive ductal carcinoma, with negative although very close (less than 1 mm) margins, and with no HER-2 amplification, signals ratio being 1.0 and number per cell 1.6.  A single sentinel lymph node was removed.  Oncotype recurrence score of 12 predicted no benefit from adjuvant chemotherapy.  The patient did not receive adjuvant radiation--she does not recall it being offered.  Adjuvantly she was treated with anastrozole and letrozole but had significant arthralgias and myalgias with and after switching to tamoxifen she developed a bothersome aginal discharge.  Accordingly she is being followed with observation alone  She had mammography at [facility] 07/15/2018 showing the breast density to be category B.  They noted lumpectomy changes in the medial right breast but no evidence of malignancy.  Note also a right breast ultrasound on 06/27/2017 for evaluation of a new erythematous lump on her right breast lumpectomy  scar.  This showed mild skin thickening consistent with inflammation such as a suture granuloma or keloid formation.  There was no discrete mass and the underlying fibroglandular tissue was unremarkable.  The patient's subsequent history is as detailed below.     PAST MEDICAL HISTORY: Past Medical History:  Diagnosis Date  . Arthritis   . Cancer ([place]) 2017   1.6 lumpectomy  . Lichen sclerosus   Gout   PAST SURGICAL HISTORY: Past Surgical History:  Procedure Laterality Date  . APPENDECTOMY    . BOWEL RESECTION    . BREAST LUMPECTOMY  2017  . CARDIOVERSION N/A 11/10/2018   Procedure: CARDIOVERSION;  Surgeon: [name], MD;  Location: [facility];  Service: Cardiovascular;  Laterality: N/A;  . CESAREAN SECTION    . HERNIA REPAIR    . TONSILLECTOMY      FAMILY HISTORY: Family History  Problem Relation Age of Onset  . Cancer Father        Lung  . Cancer Sister   Nataki's father died from lung cancer at age 19. Patients' mother died from chronic leukemia at age 79. The patient has 1 sister. Her sister was diagnosed with breast cancer in her 26s. Patient denies anyone in her family having ovarian, prostate, or pancreatic cancer.    GYNECOLOGIC HISTORY:  No LMP recorded. Patient is postmenopausal. Menarche: 87 years old Age at first live birth: 87 years old [place] P: 1 LMP: mid 16's Contraceptive:  HRT: yes, several years  Hysterectomy?: no BSO?: no   SOCIAL HISTORY: (Current as of 02/16/2019) Wendy Hebert retired from the [place] [employer]. She lives at home with her husband, [name] who is a former bookkeeper.  Their son, [name] (goes by [name]), is a Scientist, water quality in [place]. Arpita has a grandson who is 110 years old as of 02/2019. She belongs to a [employer].    ADVANCED DIRECTIVES: In the absence of any documentation, Wendy Hebert's spouse, [name], is her healthcare power of attorney.      HEALTH MAINTENANCE: Social History   Tobacco Use  . Smoking status: Former Research scientist (life sciences)  . Smokeless tobacco: Never Used  . Tobacco comment: 30+ years  Vaping Use  . Vaping Use: Never used  Substance Use Topics  . Alcohol use: Yes    Comment: occasionally  . Drug use: Never    Colonoscopy: Cologuard 03/2019, negative  PAP: 09/17/2018  Bone  density: osteopenic 07/2016 Mammography:   No Known Allergies  Current Outpatient Medications  Medication Sig Dispense Refill  . ALPRAZolam (XANAX) 0.5 MG tablet Take 0.5-1 tablets (0.25-0.5 mg total) by mouth 2 (two) times daily as needed for anxiety. 60 tablet 1  . Calcium Carb-Cholecalciferol (CALCIUM 600 + D PO) Take 1 tablet by mouth every evening. (Patient not taking: Reported on 11/03/2020)    . clobetasol cream (TEMOVATE) 6.22 % Apply 1 application topically daily. 90 g 0  . diltiazem (CARDIZEM CD) 180 MG 24 hr capsule Take 180 mg by mouth daily.    Marland Kitchen ELIQUIS 5 MG TABS tablet TAKE 1 TABLET BY MOUTH TWO  TIMES DAILY 180 tablet 3  . Melatonin 1 MG CAPS Take 2 capsules (2 mg total) by mouth at bedtime. (Patient not taking: Reported on 11/03/2020) 180 capsule 3  . metoprolol tartrate (LOPRESSOR) 25 MG tablet TAKE 1 AND 1/2 TABLETS BY  MOUTH IN THE MORNING AND 1  TABLET BY MOUTH IN THE  EVENING 225 tablet 3  . Multiple Vitamin (MULTIVITAMIN WITH MINERALS) TABS tablet Take 1  tablet by mouth every evening. (Patient not taking: Reported on 11/03/2020)    . Multiple Vitamins-Minerals (PRESERVISION AREDS 2) CAPS Take 2 capsules by mouth every evening. (Patient not taking: Reported on 11/03/2020)    . traMADol (ULTRAM) 50 MG tablet Take 1 tablet (50 mg total) by mouth every 8 (eight) hours as needed. 20 tablet 0  . vitamin C (ASCORBIC ACID) 500 MG tablet Take 500 mg by mouth daily.     No current facility-administered medications for this visit.    OBJECTIVE: white woman walking with a cane  Vitals:   11/22/20 1455  BP: 136/76  Pulse: 88  Resp: 18  Temp: 98.2 F (36.8 C)  SpO2: 100%     Body mass index is 27.18 kg/m.   Wt Readings from Last 3 Encounters:  11/22/20 146 lb 3.2 oz (66.3 kg)  11/03/20 140 lb (63.5 kg)  08/08/20 145 lb (65.8 kg)      ECOG FS:2 - Symptomatic, <50% confined to bed  Sclerae unicteric, EOMs intact Wearing a mask No cervical or supraclavicular  adenopathy Lungs no rales or rhonchi Heart regular rate and rhythm Abd soft, nontender, positive bowel sounds MSK no focal spinal tenderness, no upper extremity lymphedema Neuro: nonfocal, well oriented, depressed and frustrated affect Breasts: The right breast is status post lumpectomy.  The erythematous area lateral to the areola is unchanged as compared to the images below.  The left breast and both axillae are benign.   Right breast 08/25/2019    Right breast 02/16/2019     LAB RESULTS:  CMP     Component Value Date/Time   NA 140 11/22/2020 1431   NA 141 07/03/2018 0000   K 4.3 11/22/2020 1431   CL 104 11/22/2020 1431   CL 102 07/03/2018 0000   CO2 25 11/22/2020 1431   CO2 23 07/03/2018 0000   GLUCOSE 90 11/22/2020 1431   BUN 16 11/22/2020 1431   BUN 16 07/03/2018 0000   CREATININE 0.94 11/22/2020 1431   CALCIUM 9.3 11/22/2020 1431   CALCIUM 9.3 07/03/2018 0000   PROT 7.3 11/22/2020 1431   ALBUMIN 4.2 11/22/2020 1431   AST 26 11/22/2020 1431   ALT 19 11/22/2020 1431   ALKPHOS 93 11/22/2020 1431   BILITOT 0.6 11/22/2020 1431   GFRNONAA 59 (L) 11/22/2020 1431   GFRNONAA 47 07/03/2018 0000   GFRAA 57 (L) 11/10/2018 0858    Lab Results  Component Value Date   WBC 9.3 11/22/2020   NEUTROABS 5.6 11/22/2020   HGB 14.3 11/22/2020   HCT 43.8 11/22/2020   MCV 96.3 11/22/2020   PLT 286 11/22/2020    No results found for: LABCA2  No components found for: BWIOMB559  No results for input(s): INR in the last 168 hours.  No results found for: LABCA2  No results found for: RCB638  No results found for: GTX646  No results found for: OEH212  No results found for: CA2729  No components found for: HGQUANT  No results found for: CEA1 / No results found for: CEA1   No results found for: AFPTUMOR  No results found for: CHROMOGRNA  No results found for: TOTALPROTELP, ALBUMINELP, A1GS, A2GS, BETS, BETA2SER, GAMS, MSPIKE, SPEI (this displays SPEP labs)  No  results found for: KPAFRELGTCHN, LAMBDASER, KAPLAMBRATIO (kappa/lambda light chains)  No results found for: HGBA, HGBA2QUANT, HGBFQUANT, HGBSQUAN (Hemoglobinopathy evaluation)   No results found for: LDH  No results found for: IRON, TIBC, IRONPCTSAT (Iron and TIBC)  Lab Results  Component  Value Date   FERRITIN 26.6 11/12/2019    Urinalysis    Component Value Date/Time   PROTEINUR 6.6 07/03/2018 0000     STUDIES:  No results found.   ELIGIBLE FOR AVAILABLE RESEARCH PROTOCOL:no   ASSESSMENT: 87 y.o. [place] woman status post right upper inner quadrant lumpectomy 05/24/2016 for a pT1c pN0, stage IA invasive ductal carcinoma, grade 2, strongly estrogen and progesterone receptor positive, HER-2 nonamplified, with close but negative margins; a single sentinel lymph node was removed   (1) Oncotype score of 12 predicts a risk of recurrence outside the breast over the next 10 years of 8% if the patient takes tamoxifen for 5 years.  It also predicts no benefit from adjuvant chemotherapy.  (2) patient declined adjuvant radiation  (3) intolerant of antiestrogens  (a) anastrozole and letrozole caused arthralgias/myalgias  (b) tamoxifen caused vaginal discharge problems  (4) osteopenia with bone density November 2017  (a) s/p alendronate x7 years  (b) repeat dexa scan 12/15/202 shows T - 1.5   PLAN: Bev is now just about 5 years out from definitive surgery for her breast cancer with no evidence of disease recurrence.  This is very favorable.  I am concerned about her home situation and I came up with some suggestions but at this point she is shouldering all the work and feels she can do it.  She says she is getting enough sleep.  She does not want to have any further mammograms.  This in itself is not totally unreasonable, but I am concerned she is really making the decision based on depression and not reason.  I think it makes sense for her to take care of herself so she can  take care of her husband.  With that in mind she may reconsider.  At this point however I am releasing her to her primary care physician.  All she will need in terms of breast cancer follow-up is her yearly mammogram assuming she agrees and a yearly physician breast exam  I will be glad to see Sofie again at any point in the future if and when the need arises but as of now are making no further routine appointments for her here.  Total encounter time 25 minutes.*   Magrinat, Virgie Dad, MD  11/22/20 5:15 PM Medical Oncology and Hematology [facility] [address] [place], [place] [address] Tel. [phone]    Fax. [phone]    I, Wilburn Mylar, am acting as scribe for Dr. Virgie Dad. Magrinat.  I, Lurline Del MD, have reviewed the above documentation for accuracy and completeness, and I agree with the above.   *Total Encounter Time as defined by the Centers for Medicare and Medicaid Services includes, in addition to the face-to-face time of a patient visit (documented in the note above) non-face-to-face time: obtaining and reviewing outside history, ordering and reviewing medications, tests or procedures, care coordination (communications with other health care professionals or caregivers) and documentation in the medical record.

## 2020-11-22 ENCOUNTER — Other Ambulatory Visit: Payer: Self-pay

## 2020-11-22 ENCOUNTER — Inpatient Hospital Stay: Payer: Medicare Other | Attending: Oncology

## 2020-11-22 ENCOUNTER — Inpatient Hospital Stay: Payer: Medicare Other | Admitting: Oncology

## 2020-11-22 VITALS — BP 136/76 | HR 88 | Temp 98.2°F | Resp 18 | Ht 61.5 in | Wt 146.2 lb

## 2020-11-22 DIAGNOSIS — Z170 Estrogen receptor positive status [ER+]: Secondary | ICD-10-CM | POA: Diagnosis not present

## 2020-11-22 DIAGNOSIS — Z9221 Personal history of antineoplastic chemotherapy: Secondary | ICD-10-CM | POA: Insufficient documentation

## 2020-11-22 DIAGNOSIS — Z801 Family history of malignant neoplasm of trachea, bronchus and lung: Secondary | ICD-10-CM | POA: Diagnosis not present

## 2020-11-22 DIAGNOSIS — Z87891 Personal history of nicotine dependence: Secondary | ICD-10-CM | POA: Diagnosis not present

## 2020-11-22 DIAGNOSIS — Z806 Family history of leukemia: Secondary | ICD-10-CM | POA: Insufficient documentation

## 2020-11-22 DIAGNOSIS — C50211 Malignant neoplasm of upper-inner quadrant of right female breast: Secondary | ICD-10-CM | POA: Diagnosis not present

## 2020-11-22 DIAGNOSIS — M8580 Other specified disorders of bone density and structure, unspecified site: Secondary | ICD-10-CM | POA: Insufficient documentation

## 2020-11-22 DIAGNOSIS — Z7901 Long term (current) use of anticoagulants: Secondary | ICD-10-CM | POA: Diagnosis not present

## 2020-11-22 DIAGNOSIS — Z803 Family history of malignant neoplasm of breast: Secondary | ICD-10-CM | POA: Diagnosis not present

## 2020-11-22 DIAGNOSIS — Z79899 Other long term (current) drug therapy: Secondary | ICD-10-CM | POA: Insufficient documentation

## 2020-11-22 LAB — CBC WITH DIFFERENTIAL (CANCER CENTER ONLY)
Abs Immature Granulocytes: 0.03 10*3/uL (ref 0.00–0.07)
Basophils Absolute: 0.1 10*3/uL (ref 0.0–0.1)
Basophils Relative: 1 %
Eosinophils Absolute: 0.2 10*3/uL (ref 0.0–0.5)
Eosinophils Relative: 2 %
HCT: 43.8 % (ref 36.0–46.0)
Hemoglobin: 14.3 g/dL (ref 12.0–15.0)
Immature Granulocytes: 0 %
Lymphocytes Relative: 29 %
Lymphs Abs: 2.7 10*3/uL (ref 0.7–4.0)
MCH: 31.4 pg (ref 26.0–34.0)
MCHC: 32.6 g/dL (ref 30.0–36.0)
MCV: 96.3 fL (ref 80.0–100.0)
Monocytes Absolute: 0.7 10*3/uL (ref 0.1–1.0)
Monocytes Relative: 8 %
Neutro Abs: 5.6 10*3/uL (ref 1.7–7.7)
Neutrophils Relative %: 60 %
Platelet Count: 286 10*3/uL (ref 150–400)
RBC: 4.55 MIL/uL (ref 3.87–5.11)
RDW: 13.9 % (ref 11.5–15.5)
WBC Count: 9.3 10*3/uL (ref 4.0–10.5)
nRBC: 0 % (ref 0.0–0.2)

## 2020-11-22 LAB — CMP (CANCER CENTER ONLY)
ALT: 19 U/L (ref 0–44)
AST: 26 U/L (ref 15–41)
Albumin: 4.2 g/dL (ref 3.5–5.0)
Alkaline Phosphatase: 93 U/L (ref 38–126)
Anion gap: 11 (ref 5–15)
BUN: 16 mg/dL (ref 8–23)
CO2: 25 mmol/L (ref 22–32)
Calcium: 9.3 mg/dL (ref 8.9–10.3)
Chloride: 104 mmol/L (ref 98–111)
Creatinine: 0.94 mg/dL (ref 0.44–1.00)
GFR, Estimated: 59 mL/min — ABNORMAL LOW (ref >60)
Glucose, Bld: 90 mg/dL (ref 70–99)
Potassium: 4.3 mmol/L (ref 3.5–5.1)
Sodium: 140 mmol/L (ref 135–145)
Total Bilirubin: 0.6 mg/dL (ref 0.3–1.2)
Total Protein: 7.3 g/dL (ref 6.5–8.1)

## 2020-11-24 ENCOUNTER — Encounter: Payer: Self-pay | Admitting: Oncology

## 2020-12-02 ENCOUNTER — Encounter: Payer: Self-pay | Admitting: Family Medicine

## 2020-12-06 ENCOUNTER — Other Ambulatory Visit (HOSPITAL_COMMUNITY): Payer: Self-pay | Admitting: Family Medicine

## 2020-12-06 DIAGNOSIS — I4891 Unspecified atrial fibrillation: Secondary | ICD-10-CM

## 2020-12-21 ENCOUNTER — Encounter: Payer: Self-pay | Admitting: Family Medicine

## 2020-12-22 ENCOUNTER — Encounter: Payer: Self-pay | Admitting: Family Medicine

## 2020-12-23 ENCOUNTER — Encounter: Payer: Self-pay | Admitting: Family Medicine

## 2021-01-29 ENCOUNTER — Encounter: Payer: Self-pay | Admitting: Family Medicine

## 2021-02-05 ENCOUNTER — Encounter: Payer: Self-pay | Admitting: Family Medicine

## 2021-03-09 NOTE — Progress Notes (Signed)
[place] at [facility] [place], [place], [place] [address] [phone]  Date:  03/15/2021   Name:  Wendy Hebert   DOB:  04-09-1933   MRN:  [id number]  PCP:  Darreld Mclean, MD    Chief Complaint: handicap placard  (Wants to get renewed. ), Frequent BM  (5 BM a day for the last month. ), and stress- Home   History of Present Illness:  Wendy Hebert is a 87 y.o. very pleasant female patient who presents with the following:  Pt seen today for a follow-up visit  Last seen by myself in March of this year - history of breast cancer and atrial fibrillation Her cardiologist is Dr. Loni Hebert with [facility] She is in permanent A. fib and anticoagulated  She is under a lot of stress as her husband Wendy Hebert is declining  They think that he had a stroke last year   Last visit with oncology also in March- Dr Wendy Hebert At that time she had decided to stop screening mammogram- however Dr Wendy Hebert was concerned that this decision was made due to stress/ depression due to her home situation   Shingles vaccine Mammo? Pt has declined to continue  Covid 4th dose  Labs are UTD- her last renal function showed improvement  Can give a dose of prevnar 20   Eliquis Diltiazam Lopresor  ?? Start depression med- pt notes that she is very stressed, and admits that this may lead to depression  She would be willing to try taking zoloft - I will rx for her Denies any suicidal ideation  Pt notes she is having 5 BM daily- she thinks this may be due to stress No blood or mucus in her stool No belly pain No vomiting She is eating more greens recently but no major diet change otherwise Wt Readings from Last 3 Encounters:  03/15/21 140 lb (63.5 kg)  11/22/20 146 lb 3.2 oz (66.3 kg)  11/03/20 140 lb (63.5 kg)   Weight was 145 12/21  About 3 weeks ago she noticed that her head was shaking involuntarily This will come and go May last 1-2 minutes and  then resolve spontaneously -we discussed having her see neurology for this issue.  Right now she does not wish to have a referral  She is concerned about some actinic keratosis as per usual Patient Active Problem List   Diagnosis Date Noted   Osteopenia 08/25/2019   Malignant neoplasm of upper-inner quadrant of right breast in female, estrogen receptor positive ([facility]) 02/16/2019   Atrial fibrillation ([facility]) 10/01/2018   Gait difficulty 79/10/4095   Lichen sclerosus 35/32/9924    Past Medical History:  Diagnosis Date   Arthritis    Cancer ([facility]) 2017   1.6 lumpectomy   Lichen sclerosus     Past Surgical History:  Procedure Laterality Date   APPENDECTOMY     BOWEL RESECTION     BREAST LUMPECTOMY  2017   CARDIOVERSION N/A 11/10/2018   Procedure: CARDIOVERSION;  Surgeon: [name], MD;  Location: [facility];  Service: Cardiovascular;  Laterality: N/A;   CESAREAN SECTION     HERNIA REPAIR     TONSILLECTOMY      Social History   Tobacco Use   Smoking status: Former    Pack years: 0.00   Smokeless tobacco: Never   Tobacco comments:    30+ years  Vaping Use   Vaping Use: Never used  Substance  Use Topics   Alcohol use: Yes    Comment: occasionally   Drug use: Never    Family History  Problem Relation Age of Onset   Cancer Father        Lung   Cancer Sister     No Known Allergies  Medication list has been reviewed and updated.  Current Outpatient Medications on File Prior to Visit  Medication Sig Dispense Refill   ALPRAZolam (XANAX) 0.5 MG tablet Take 0.5-1 tablets (0.25-0.5 mg total) by mouth 2 (two) times daily as needed for anxiety. 60 tablet 1   apixaban (ELIQUIS) 5 MG TABS tablet Take 1 tablet (5 mg total) by mouth 2 (two) times daily. 180 tablet 1   Calcium Carb-Cholecalciferol (CALCIUM 600 + D PO) Take 1 tablet by mouth every evening.     clobetasol cream (TEMOVATE) 6.96 % Apply 1 application topically daily. 90 g 0   diltiazem (CARDIZEM CD) 180 MG  24 hr capsule Take 180 mg by mouth daily.     Melatonin 1 MG CAPS Take 2 capsules (2 mg total) by mouth at bedtime. 180 capsule 3   metoprolol tartrate (LOPRESSOR) 25 MG tablet TAKE 1 AND 1/2 TABLETS BY  MOUTH IN THE MORNING AND 1  TABLET BY MOUTH IN THE  EVENING 225 tablet 3   Multiple Vitamin (MULTIVITAMIN WITH MINERALS) TABS tablet Take 1 tablet by mouth every evening.     Multiple Vitamins-Minerals (PRESERVISION AREDS 2) CAPS Take 2 capsules by mouth every evening.     vitamin C (ASCORBIC ACID) 500 MG tablet Take 500 mg by mouth daily.     No current facility-administered medications on file prior to visit.    Review of Systems:  As per HPI- otherwise negative.   Physical Examination: Vitals:   03/15/21 1526  BP: 138/86  Pulse: 98  Temp: 97.7 F (36.5 C)  SpO2: 97%   Vitals:   03/15/21 1526  Weight: 140 lb (63.5 kg)  Height: 5\' 1"  (1.549 m)   Body mass index is 26.45 kg/m. Ideal Body Weight: Weight in (lb) to have BMI = 25: 132  GEN: no acute distress.  Looks well, normal weight for age 87: Atraumatic, Normocephalic.  Ears and Nose: No external deformity. CV: Seems to be in rate controlled atrial fibrillation today no M/G/R. No JVD. No thrill. No extra heart sounds. PULM: CTA B, no wheezes, crackles, rhonchi. No retractions. No resp. distress. No accessory muscle use. ABD: S, NT, ND, +BS. No rebound. No HSM. EXTR: No c/c/e PSYCH: Normally interactive. Conversant.  She has impacted cerumen in her right ear canal.  She would like this to be removed.  Verbal consent obtained, we attempted water irrigation to remove wax.  Her wax was quite hard and difficult to remove, a small amount was extracted that we were not able to completely clear her ear.  She tolerated well with no complications or injury  I also performed cryotherapy to 2 actinic keratoses, 1 on her scalp 1 right arm.  Patient tolerated well with no complications Assessment and Plan: Adjustment disorder with  depressed mood - Plan: sertraline (ZOLOFT) 50 MG tablet  Atrial fibrillation, unspecified type (HCC)  Seborrheic keratosis  Frequent stools  Tremor  Here today with a couple of concerns.  Wendy Hebert has been under a lot of stress as her husband's health declines.  She is willing to try medication for depression today, I called in sertraline 50.  She will let me know how she responds to  this medication  She is somewhat eager to stop Eliquis.  However, today we discovered she is again in atrial fit.  She is willing to continue taking Eliquis for this reason  Seborrheic keratoses treated as above  Discussed her frequent stools, I suggested trying a fiber supplement to bulk and firm her stools-she will give this a try  She describes an occasional period of possible tremor in her head.  This is not happening currently.  I offered to have her seen by neurology but she declines for now This visit occurred during the SARS-CoV-2 public health emergency.  Safety protocols were in place, including screening questions prior to the visit, additional usage of staff PPE, and extensive cleaning of exam room while observing appropriate contact time as indicated for disinfecting solutions.  Signed Lamar Blinks, MD

## 2021-03-15 ENCOUNTER — Encounter: Payer: Self-pay | Admitting: Family Medicine

## 2021-03-15 ENCOUNTER — Other Ambulatory Visit: Payer: Self-pay

## 2021-03-15 ENCOUNTER — Ambulatory Visit (INDEPENDENT_AMBULATORY_CARE_PROVIDER_SITE_OTHER): Payer: Medicare Other | Admitting: Family Medicine

## 2021-03-15 VITALS — BP 138/86 | HR 98 | Temp 97.7°F | Ht 61.0 in | Wt 140.0 lb

## 2021-03-15 DIAGNOSIS — R251 Tremor, unspecified: Secondary | ICD-10-CM

## 2021-03-15 DIAGNOSIS — F4321 Adjustment disorder with depressed mood: Secondary | ICD-10-CM | POA: Diagnosis not present

## 2021-03-15 DIAGNOSIS — K529 Noninfective gastroenteritis and colitis, unspecified: Secondary | ICD-10-CM

## 2021-03-15 DIAGNOSIS — I4891 Unspecified atrial fibrillation: Secondary | ICD-10-CM

## 2021-03-15 DIAGNOSIS — L821 Other seborrheic keratosis: Secondary | ICD-10-CM

## 2021-03-15 MED ORDER — SERTRALINE HCL 50 MG PO TABS: 50.0000 mg | ORAL_TABLET | Freq: Every day | ORAL | 3 refills | Status: DC

## 2021-03-15 NOTE — Patient Instructions (Addendum)
It was good to see you again today. You might try some wax remover drops such as Debrox for your right-sided earwax  Lets have you start on sertraline/Zoloft, 50 mg daily for depression symptoms.  Please let me know how this works for you  Tried a fiber supplement to help firm up your stools  Please see me in about 3 months for follow up

## 2021-04-03 ENCOUNTER — Encounter: Payer: Self-pay | Admitting: Family Medicine

## 2021-04-05 MED ORDER — TRAZODONE HCL 50 MG PO TABS: 25.0000 mg | ORAL_TABLET | Freq: Every evening | ORAL | 3 refills | Status: DC | PRN

## 2021-04-05 NOTE — Addendum Note (Signed)
Addended by: Lamar Blinks C on: 04/05/2021 07:33 PM   Modules accepted: Orders

## 2021-04-22 ENCOUNTER — Encounter: Payer: Self-pay | Admitting: Family Medicine

## 2021-05-09 DIAGNOSIS — M1A0720 Idiopathic chronic gout, left ankle and foot, without tophus (tophi): Secondary | ICD-10-CM | POA: Diagnosis not present

## 2021-05-09 DIAGNOSIS — M79675 Pain in left toe(s): Secondary | ICD-10-CM | POA: Diagnosis not present

## 2021-05-12 ENCOUNTER — Emergency Department (HOSPITAL_BASED_OUTPATIENT_CLINIC_OR_DEPARTMENT_OTHER)
Admission: EM | Admit: 2021-05-12 | Discharge: 2021-05-12 | Disposition: A | Discharge status: Home / Self Care | Payer: Medicare Other | Attending: Emergency Medicine | Admitting: Emergency Medicine

## 2021-05-12 ENCOUNTER — Encounter (HOSPITAL_BASED_OUTPATIENT_CLINIC_OR_DEPARTMENT_OTHER): Payer: Self-pay | Admitting: *Deleted

## 2021-05-12 ENCOUNTER — Emergency Department (HOSPITAL_BASED_OUTPATIENT_CLINIC_OR_DEPARTMENT_OTHER): Payer: Medicare Other

## 2021-05-12 ENCOUNTER — Other Ambulatory Visit: Payer: Self-pay

## 2021-05-12 DIAGNOSIS — R1032 Left lower quadrant pain: Secondary | ICD-10-CM | POA: Diagnosis present

## 2021-05-12 DIAGNOSIS — K567 Ileus, unspecified: Secondary | ICD-10-CM | POA: Diagnosis not present

## 2021-05-12 DIAGNOSIS — Z7901 Long term (current) use of anticoagulants: Secondary | ICD-10-CM | POA: Insufficient documentation

## 2021-05-12 DIAGNOSIS — K3189 Other diseases of stomach and duodenum: Secondary | ICD-10-CM | POA: Diagnosis not present

## 2021-05-12 DIAGNOSIS — K6389 Other specified diseases of intestine: Secondary | ICD-10-CM | POA: Diagnosis not present

## 2021-05-12 DIAGNOSIS — K5730 Diverticulosis of large intestine without perforation or abscess without bleeding: Secondary | ICD-10-CM | POA: Diagnosis not present

## 2021-05-12 DIAGNOSIS — D72829 Elevated white blood cell count, unspecified: Secondary | ICD-10-CM | POA: Diagnosis not present

## 2021-05-12 DIAGNOSIS — Z87891 Personal history of nicotine dependence: Secondary | ICD-10-CM | POA: Insufficient documentation

## 2021-05-12 DIAGNOSIS — Z853 Personal history of malignant neoplasm of breast: Secondary | ICD-10-CM | POA: Diagnosis not present

## 2021-05-12 DIAGNOSIS — Z79899 Other long term (current) drug therapy: Secondary | ICD-10-CM | POA: Diagnosis not present

## 2021-05-12 DIAGNOSIS — Z9012 Acquired absence of left breast and nipple: Secondary | ICD-10-CM | POA: Insufficient documentation

## 2021-05-12 DIAGNOSIS — K449 Diaphragmatic hernia without obstruction or gangrene: Secondary | ICD-10-CM | POA: Diagnosis not present

## 2021-05-12 LAB — COMPREHENSIVE METABOLIC PANEL
ALT: 18 U/L (ref 0–44)
AST: 27 U/L (ref 15–41)
Albumin: 4.3 g/dL (ref 3.5–5.0)
Alkaline Phosphatase: 101 U/L (ref 38–126)
Anion gap: 12 (ref 5–15)
BUN: 18 mg/dL (ref 8–23)
CO2: 28 mmol/L (ref 22–32)
Calcium: 9.2 mg/dL (ref 8.9–10.3)
Chloride: 100 mmol/L (ref 98–111)
Creatinine, Ser: 1.1 mg/dL — ABNORMAL HIGH (ref 0.44–1.00)
GFR, Estimated: 49 mL/min — ABNORMAL LOW (ref >60)
Glucose, Bld: 120 mg/dL — ABNORMAL HIGH (ref 70–99)
Potassium: 3.7 mmol/L (ref 3.5–5.1)
Sodium: 140 mmol/L (ref 135–145)
Total Bilirubin: 0.5 mg/dL (ref 0.3–1.2)
Total Protein: 7.3 g/dL (ref 6.5–8.1)

## 2021-05-12 LAB — CBC WITH DIFFERENTIAL/PLATELET
Abs Immature Granulocytes: 0.06 10*3/uL (ref 0.00–0.07)
Basophils Absolute: 0 10*3/uL (ref 0.0–0.1)
Basophils Relative: 0 %
Eosinophils Absolute: 0.1 10*3/uL (ref 0.0–0.5)
Eosinophils Relative: 1 %
HCT: 46.2 % — ABNORMAL HIGH (ref 36.0–46.0)
Hemoglobin: 15.2 g/dL — ABNORMAL HIGH (ref 12.0–15.0)
Immature Granulocytes: 0 %
Lymphocytes Relative: 18 %
Lymphs Abs: 2.6 10*3/uL (ref 0.7–4.0)
MCH: 31.1 pg (ref 26.0–34.0)
MCHC: 32.9 g/dL (ref 30.0–36.0)
MCV: 94.7 fL (ref 80.0–100.0)
Monocytes Absolute: 0.8 10*3/uL (ref 0.1–1.0)
Monocytes Relative: 5 %
Neutro Abs: 10.6 10*3/uL — ABNORMAL HIGH (ref 1.7–7.7)
Neutrophils Relative %: 76 %
Platelets: 339 10*3/uL (ref 150–400)
RBC: 4.88 MIL/uL (ref 3.87–5.11)
RDW: 13.3 % (ref 11.5–15.5)
WBC: 14.2 10*3/uL — ABNORMAL HIGH (ref 4.0–10.5)
nRBC: 0 % (ref 0.0–0.2)

## 2021-05-12 LAB — URINALYSIS, ROUTINE W REFLEX MICROSCOPIC
Bilirubin Urine: NEGATIVE
Glucose, UA: NEGATIVE mg/dL
Hgb urine dipstick: NEGATIVE
Ketones, ur: NEGATIVE mg/dL
Leukocytes,Ua: NEGATIVE
Nitrite: NEGATIVE
Protein, ur: NEGATIVE mg/dL
Specific Gravity, Urine: 1.01 (ref 1.005–1.030)
pH: 7 (ref 5.0–8.0)

## 2021-05-12 LAB — LIPASE, BLOOD: Lipase: 39 U/L (ref 11–51)

## 2021-05-12 MED ORDER — SODIUM CHLORIDE 0.9 % IV BOLUS: 500.0000 mL | Freq: Once | INTRAVENOUS | Status: DC

## 2021-05-12 NOTE — Discharge Instructions (Addendum)
Follow-up with your primary care doctor if your symptoms are not improving within the next few days.  Return here as needed if you have any worsening symptoms.

## 2021-05-12 NOTE — ED Triage Notes (Signed)
C/o left lower add pain with nausea x 1 day HX hernia

## 2021-05-12 NOTE — ED Notes (Signed)
Patient refuses IV. Provider informed

## 2021-05-12 NOTE — ED Provider Notes (Signed)
[facility] Provider Note   CSN: [id number] Arrival date & time: 05/12/21  1502     History Chief Complaint  Patient presents with   Abdominal Pain    Wendy Hebert is a 87 y.o. female.  Patient is an 87 year old female who presents with abdominal pain.  She says over the last 2 days she has had some intermittent pain in her left lower abdomen.  She says last night she was doing some leg lifts and had sudden onset of worsening pain in her left lower abdomen.  Its been constant throughout today.  She has had some associated nausea and vomiting.  No change in her stools.  She says normally she has about 5 small stools a day and she had that yesterday.  Today she only had maybe 1 stool.  She denies any urinary symptoms.  No known fevers.  She does have a remote history 30 years ago of what she says is "a kink in her bowel".  She had surgery for this and had a resulting hernia in her left lower abdomen.  She has not had any issues with that in about 30 years.      Past Medical History:  Diagnosis Date   Arthritis    Cancer ([place]) 2017   1.6 lumpectomy   Lichen sclerosus     Patient Active Problem List   Diagnosis Date Noted   Osteopenia 08/25/2019   Malignant neoplasm of upper-inner quadrant of right breast in female, estrogen receptor positive ([facility]) 02/16/2019   Atrial fibrillation ([facility]) 10/01/2018   Gait difficulty [id number]   Lichen sclerosus [id number]    Past Surgical History:  Procedure Laterality Date   APPENDECTOMY     BOWEL RESECTION     BREAST LUMPECTOMY  2017   CARDIOVERSION N/A 11/10/2018   Procedure: CARDIOVERSION;  Surgeon: [name], MD;  Location: [facility];  Service: Cardiovascular;  Laterality: N/A;   [place]       OB History   No obstetric history on file.     Family History  Problem Relation Age of Onset   Cancer Father        Lung   Cancer Sister      Social History   Tobacco Use   Smoking status: Former   Smokeless tobacco: Never   Tobacco comments:    30+ years  Vaping Use   Vaping Use: Never used  Substance Use Topics   Alcohol use: Yes    Comment: occasionally   Drug use: Never    Home Medications Prior to Admission medications   Medication Sig Start Date End Date Taking? Authorizing Provider  ALPRAZolam Duanne Moron) 0.5 MG tablet Take 0.5-1 tablets (0.25-0.5 mg total) by mouth 2 (two) times daily as needed for anxiety. 08/08/20   Copland, Gay Filler, MD  apixaban (ELIQUIS) 5 MG TABS tablet Take 1 tablet (5 mg total) by mouth 2 (two) times daily. 12/06/20   Copland, Gay Filler, MD  Calcium Carb-Cholecalciferol (CALCIUM 600 + D PO) Take 1 tablet by mouth every evening.    [provider]  clobetasol cream (TEMOVATE) [id number] % Apply 1 application topically daily. 07/18/20   Copland, Gay Filler, MD  diltiazem (CARDIZEM CD) 180 MG 24 hr capsule Take 180 mg by mouth daily. 11/02/19   [provider]  Melatonin 1 MG CAPS Take 2 capsules (2 mg total) by mouth at bedtime. 08/08/20  Copland, Gay Filler, MD  metoprolol tartrate (LOPRESSOR) 25 MG tablet TAKE 1 AND 1/2 TABLETS BY  MOUTH IN THE MORNING AND 1  TABLET BY MOUTH IN THE  EVENING 09/05/20   Copland, Gay Filler, MD  Multiple Vitamin (MULTIVITAMIN WITH MINERALS) TABS tablet Take 1 tablet by mouth every evening.    [provider]  Multiple Vitamins-Minerals (PRESERVISION AREDS 2) CAPS Take 2 capsules by mouth every evening.    [provider]  traZODone (DESYREL) 50 MG tablet Take 0.5-1 tablets (25-50 mg total) by mouth at bedtime as needed for sleep. 04/05/21   Copland, Gay Filler, MD  vitamin C (ASCORBIC ACID) 500 MG tablet Take 500 mg by mouth daily.    [provider]    Allergies    Patient has no known allergies.  Review of Systems   Review of Systems  Constitutional:  Negative for chills, diaphoresis, fatigue and fever.  HENT:  Negative for  congestion, rhinorrhea and sneezing.   Eyes: Negative.   Respiratory:  Negative for cough, chest tightness and shortness of breath.   Cardiovascular:  Negative for chest pain and leg swelling.  Gastrointestinal:  Positive for abdominal pain, nausea and vomiting. Negative for blood in stool and diarrhea.  Genitourinary:  Negative for difficulty urinating, flank pain, frequency and hematuria.  Musculoskeletal:  Negative for arthralgias and back pain.  Skin:  Negative for rash.  Neurological:  Negative for dizziness, speech difficulty, weakness, numbness and headaches.   Physical Exam Updated Vital Signs BP (!) 118/93 (BP Location: Right Arm)   Pulse 68   Temp 98.9 F (37.2 C)   Resp 15   Ht '5\' 1"'$  (1.549 m)   Wt 62.1 kg   SpO2 97%   BMI 25.89 kg/m   Physical Exam Constitutional:      Appearance: She is well-developed.  HENT:     Head: Normocephalic and atraumatic.  Eyes:     Pupils: Pupils are equal, round, and reactive to light.  Cardiovascular:     Rate and Rhythm: Normal rate and regular rhythm.     Heart sounds: Normal heart sounds.  Pulmonary:     Effort: Pulmonary effort is normal. No respiratory distress.     Breath sounds: Normal breath sounds. No wheezing or rales.  Chest:     Chest wall: No tenderness.  Abdominal:     General: Bowel sounds are normal.     Palpations: Abdomen is soft.     Tenderness: There is abdominal tenderness in the periumbilical area and left lower quadrant. There is no guarding or rebound.  Musculoskeletal:        General: Normal range of motion.     Cervical back: Normal range of motion and neck supple.  Lymphadenopathy:     Cervical: No cervical adenopathy.  Skin:    General: Skin is warm and dry.     Findings: No rash.  Neurological:     Mental Status: She is alert and oriented to person, place, and time.    ED Results / Procedures / Treatments   Labs (all labs ordered are listed, but only abnormal results are displayed) Labs  Reviewed  COMPREHENSIVE METABOLIC PANEL - Abnormal; Notable for the following components:      Result Value   Glucose, Bld 120 (*)    Creatinine, Ser 1.10 (*)    GFR, Estimated 49 (*)    All other components within normal limits  CBC WITH DIFFERENTIAL/PLATELET - Abnormal; Notable for the following components:  WBC 14.2 (*)    Hemoglobin 15.2 (*)    HCT 46.2 (*)    Neutro Abs 10.6 (*)    All other components within normal limits  URINALYSIS, ROUTINE W REFLEX MICROSCOPIC - Abnormal; Notable for the following components:   APPearance HAZY (*)    All other components within normal limits  LIPASE, BLOOD    EKG None  Radiology CT ABDOMEN PELVIS WO CONTRAST  Result Date: 05/12/2021 CLINICAL DATA:  Lower pain with nausea EXAM: CT ABDOMEN AND PELVIS WITHOUT CONTRAST TECHNIQUE: Multidetector CT imaging of the abdomen and pelvis was performed following the standard protocol without IV contrast. COMPARISON:  CT 09/23/2018 FINDINGS: Lower chest: Lung bases demonstrate no acute consolidation or effusion. Minimal fibrosis in the medial right base. Small hiatal hernia Hepatobiliary: No focal liver abnormality is seen. No gallstones, gallbladder wall thickening, or biliary dilatation. Pancreas: Unremarkable. No pancreatic ductal dilatation or surrounding inflammatory changes. Spleen: Normal in size without focal abnormality. Adrenals/Urinary Tract: Adrenal glands are unremarkable. Kidneys are normal, without renal calculi, focal lesion, or hydronephrosis. Bladder is unremarkable. Stomach/Bowel: Stomach moderately distended with fluid. Diffuse fluid-filled small and large bowel without definitive transition point. Diffuse fluid in the colon. No colon wall thickening. Postsurgical changes at the cecum and ileocecal region. Dilated ascending colon is chronic. Diverticular disease of the sigmoid colon without acute wall thickening Vascular/Lymphatic: Moderate aortic atherosclerosis. No aneurysm. No suspicious  nodes Reproductive: Uterus and bilateral adnexa are unremarkable. Other: Negative for free air.  Trace fluid in the pelvis. Musculoskeletal: Grade 1 anterolisthesis L4 on L5. No acute osseous abnormality IMPRESSION: 1. Diffuse fluid within the small and large bowel suggestive of ileus or enteritis. No convincing evidence for obstruction at this time. Negative for acute bowel wall thickening. 2. Mild sigmoid colon diverticular disease without acute inflammatory process 3. Trace free fluid in the pelvis. Electronically Signed   By: Donavan Foil M.D.   On: 05/12/2021 20:27    Procedures Procedures   Medications Ordered in ED Medications  sodium chloride 0.9 % bolus 500 mL (has no administration in time range)    ED Course  I have reviewed the triage vital signs and the nursing notes.  Pertinent labs & imaging results that were available during my care of the patient were reviewed by me and considered in my medical decision making (see chart for details).    MDM Rules/Calculators/A&P                           Patient is a 87 year old female who presents with left side abdominal pain associated with some nausea and vomiting.  She is afebrile.  She does have an elevated WBC count.  She is reportedly had a prior blockage.  CT scan shows evidence of an ileus but no definitive obstruction.  No other suggestions of infection.  Her urinalysis is clean.  Her other blood work is nonconcerning.  Her creatinine is similar to prior values.  She got very impatient and was adamant about leaving.  She did not want any further observation.  She did drink a cup of water without any nausea or vomiting.  She however would not stay for any further observation.  She was discharged home in good condition.  She was given strict return precautions. Final Clinical Impression(s) / ED Diagnoses Final diagnoses:  Ileus ([facility])    Rx / DC Orders ED Discharge Orders     None  Malvin Johns, MD 05/12/21  2140

## 2021-05-15 ENCOUNTER — Telehealth: Payer: Self-pay | Admitting: Family Medicine

## 2021-05-15 NOTE — Telephone Encounter (Signed)
Will call patient back, please advise if patient needs to come in or can I schedule VV?

## 2021-05-15 NOTE — Telephone Encounter (Signed)
Patient called to speak to [place] regarding her ER visit for abdominal pain. She was advised to come in for a hospital follow up but she states it's too hard for her to come in because she has no help in order to get here. She states they told her she has an ileus and just wants to know if she has to follow up or not. And if she does, could it be a telephone call. Please advice.

## 2021-05-16 NOTE — Telephone Encounter (Signed)
Patient has been scheduled for vv next week for ER follow up.

## 2021-05-21 ENCOUNTER — Encounter: Payer: Self-pay | Admitting: Family Medicine

## 2021-05-22 ENCOUNTER — Ambulatory Visit (INDEPENDENT_AMBULATORY_CARE_PROVIDER_SITE_OTHER): Payer: Medicare Other

## 2021-05-22 VITALS — Ht 61.0 in | Wt 137.0 lb

## 2021-05-22 DIAGNOSIS — Z0000 Encounter for general adult medical examination without abnormal findings: Secondary | ICD-10-CM

## 2021-05-22 NOTE — Patient Instructions (Signed)
Wendy Hebert , Thank you for taking time to complete your Medicare Wellness Visit. I appreciate your ongoing commitment to your health goals. Please review the following plan we discussed and let me know if I can assist you in the future.   Screening recommendations/referrals: Colonoscopy: No longer required Mammogram: Due-Per our conversation, you will call to schedule when you are ready. Bone Density: Completed 08/18/2019-Due 08/17/2021 Recommended yearly ophthalmology/optometry visit for glaucoma screening and checkup Recommended yearly dental visit for hygiene and checkup  Vaccinations: Influenza vaccine: Due -May obtain vaccine at our office or your local pharmacy. Pneumococcal vaccine: Up to date. Tdap vaccine: Up to date-Due-02/24/2029 Shingles vaccine: Declined   Covid-19:May obtain booster at the pharmacy.  Advanced directives: Please bring a copy for your chart  Conditions/risks identified: See problem lists  Next appointment: Follow up in one year for your annual wellness visit    Preventive Care 65 Years and Older, Female Preventive care refers to lifestyle choices and visits with your health care provider that can promote health and wellness. What does preventive care include? A yearly physical exam. This is also called an annual well check. Dental exams once or twice a year. Routine eye exams. Ask your health care provider how often you should have your eyes checked. Personal lifestyle choices, including: Daily care of your teeth and gums. Regular physical activity. Eating a healthy diet. Avoiding tobacco and drug use. Limiting alcohol use. Practicing safe sex. Taking low-dose aspirin every day. Taking vitamin and mineral supplements as recommended by your health care provider. What happens during an annual well check? The services and screenings done by your health care provider during your annual well check will depend on your age, overall health, lifestyle risk  factors, and family history of disease. Counseling  Your health care provider may ask you questions about your: Alcohol use. Tobacco use. Drug use. Emotional well-being. Home and relationship well-being. Sexual activity. Eating habits. History of falls. Memory and ability to understand (cognition). Work and work Statistician. Reproductive health. Screening  You may have the following tests or measurements: Height, weight, and BMI. Blood pressure. Lipid and cholesterol levels. These may be checked every 5 years, or more frequently if you are over 29 years old. Skin check. Lung cancer screening. You may have this screening every year starting at age 19 if you have a 30-pack-year history of smoking and currently smoke or have quit within the past 15 years. Fecal occult blood test (FOBT) of the stool. You may have this test every year starting at age 65. Flexible sigmoidoscopy or colonoscopy. You may have a sigmoidoscopy every 5 years or a colonoscopy every 10 years starting at age 22. Hepatitis C blood test. Hepatitis B blood test. Sexually transmitted disease (STD) testing. Diabetes screening. This is done by checking your blood sugar (glucose) after you have not eaten for a while (fasting). You may have this done every 1-3 years. Bone density scan. This is done to screen for osteoporosis. You may have this done starting at age 47. Mammogram. This may be done every 1-2 years. Talk to your health care provider about how often you should have regular mammograms. Talk with your health care provider about your test results, treatment options, and if necessary, the need for more tests. Vaccines  Your health care provider may recommend certain vaccines, such as: Influenza vaccine. This is recommended every year. Tetanus, diphtheria, and acellular pertussis (Tdap, Td) vaccine. You may need a Td booster every 10 years. Zoster vaccine. You  may need this after age 75. Pneumococcal 13-valent  conjugate (PCV13) vaccine. One dose is recommended after age 41. Pneumococcal polysaccharide (PPSV23) vaccine. One dose is recommended after age 64. Talk to your health care provider about which screenings and vaccines you need and how often you need them. This information is not intended to replace advice given to you by your health care provider. Make sure you discuss any questions you have with your health care provider. Document Released: 09/16/2015 Document Revised: 05/09/2016 Document Reviewed: 06/21/2015 Elsevier Interactive Patient Education  2017 [place] Prevention in the Home Falls can cause injuries. They can happen to people of all ages. There are many things you can do to make your home safe and to help prevent falls. What can I do on the outside of my home? Regularly fix the edges of walkways and driveways and fix any cracks. Remove anything that might make you trip as you walk through a door, such as a raised step or threshold. Trim any bushes or trees on the path to your home. Use bright outdoor lighting. Clear any walking paths of anything that might make someone trip, such as rocks or tools. Regularly check to see if handrails are loose or broken. Make sure that both sides of any steps have handrails. Any raised decks and porches should have guardrails on the edges. Have any leaves, snow, or ice cleared regularly. Use sand or salt on walking paths during winter. Clean up any spills in your garage right away. This includes oil or grease spills. What can I do in the bathroom? Use night lights. Install grab bars by the toilet and in the tub and shower. Do not use towel bars as grab bars. Use non-skid mats or decals in the tub or shower. If you need to sit down in the shower, use a plastic, non-slip stool. Keep the floor dry. Clean up any water that spills on the floor as soon as it happens. Remove soap buildup in the tub or shower regularly. Attach bath mats  securely with double-sided non-slip rug tape. Do not have throw rugs and other things on the floor that can make you trip. What can I do in the bedroom? Use night lights. Make sure that you have a light by your bed that is easy to reach. Do not use any sheets or blankets that are too big for your bed. They should not hang down onto the floor. Have a firm chair that has side arms. You can use this for support while you get dressed. Do not have throw rugs and other things on the floor that can make you trip. What can I do in the kitchen? Clean up any spills right away. Avoid walking on wet floors. Keep items that you use a lot in easy-to-reach places. If you need to reach something above you, use a strong step stool that has a grab bar. Keep electrical cords out of the way. Do not use floor polish or wax that makes floors slippery. If you must use wax, use non-skid floor wax. Do not have throw rugs and other things on the floor that can make you trip. What can I do with my stairs? Do not leave any items on the stairs. Make sure that there are handrails on both sides of the stairs and use them. Fix handrails that are broken or loose. Make sure that handrails are as long as the stairways. Check any carpeting to make sure that it is firmly  attached to the stairs. Fix any carpet that is loose or worn. Avoid having throw rugs at the top or bottom of the stairs. If you do have throw rugs, attach them to the floor with carpet tape. Make sure that you have a light switch at the top of the stairs and the bottom of the stairs. If you do not have them, ask someone to add them for you. What else can I do to help prevent falls? Wear shoes that: Do not have high heels. Have rubber bottoms. Are comfortable and fit you well. Are closed at the toe. Do not wear sandals. If you use a stepladder: Make sure that it is fully opened. Do not climb a closed stepladder. Make sure that both sides of the stepladder  are locked into place. Ask someone to hold it for you, if possible. Clearly mark and make sure that you can see: Any grab bars or handrails. First and last steps. Where the edge of each step is. Use tools that help you move around (mobility aids) if they are needed. These include: Canes. Walkers. Scooters. Crutches. Turn on the lights when you go into a dark area. Replace any light bulbs as soon as they burn out. Set up your furniture so you have a clear path. Avoid moving your furniture around. If any of your floors are uneven, fix them. If there are any pets around you, be aware of where they are. Review your medicines with your doctor. Some medicines can make you feel dizzy. This can increase your chance of falling. Ask your doctor what other things that you can do to help prevent falls. This information is not intended to replace advice given to you by your health care provider. Make sure you discuss any questions you have with your health care provider. Document Released: 06/16/2009 Document Revised: 01/26/2016 Document Reviewed: 09/24/2014 Elsevier Interactive Patient Education  2017 [employer].

## 2021-05-22 NOTE — Progress Notes (Signed)
Subjective:   Wendy Hebert is a 87 y.o. female who presents for an Initial Medicare Annual Wellness Visit.  I connected with Wendy Hebert today by telephone and verified that I am speaking with the correct person using two identifiers. Location patient: home Location provider: work Persons participating in the virtual visit: patient, Marine scientist.    I discussed the limitations, risks, security and privacy concerns of performing an evaluation and management service by telephone and the availability of in person appointments. I also discussed with the patient that there may be a patient responsible charge related to this service. The patient expressed understanding and verbally consented to this telephonic visit.    Interactive audio and video telecommunications were attempted between this provider and patient, however failed, due to patient having technical difficulties OR patient did not have access to video capability.  We continued and completed visit with audio only.  Some vital signs may be absent or patient reported.   Time Spent with patient on telephone encounter: 25 minutes   Review of Systems     Cardiac Risk Factors include: advanced age (>82mn, >>18women);sedentary lifestyle     Objective:    Today's Vitals   05/22/21 1500  Weight: 137 lb (62.1 kg)  Height: '5\' 1"'$  (1.549 m)  PainSc: 1    Body mass index is 25.89 kg/m.  Advanced Directives 05/22/2021 11/10/2018 10/17/2018  Does Patient Have a Medical Advance Directive? Yes Yes Yes  Type of AParamed[place]of [place] will Healthcare Power of [place] will  Copy of [place] Chart? No - copy requested No - copy requested No - copy requested  Would patient like information on creating a medical advance directive? - - No - Patient declined    Current Medications (verified) Outpatient Encounter Medications as of 05/22/2021  Medication Sig    ALPRAZolam (XANAX) 0.5 MG tablet Take 0.5-1 tablets (0.25-0.5 mg total) by mouth 2 (two) times daily as needed for anxiety.   apixaban (ELIQUIS) 5 MG TABS tablet Take 1 tablet (5 mg total) by mouth 2 (two) times daily.   diltiazem (CARDIZEM CD) 180 MG 24 hr capsule Take 180 mg by mouth daily.   metoprolol tartrate (LOPRESSOR) 25 MG tablet TAKE 1 AND 1/2 TABLETS BY  MOUTH IN THE MORNING AND 1  TABLET BY MOUTH IN THE  EVENING   traZODone (DESYREL) 50 MG tablet Take 0.5-1 tablets (25-50 mg total) by mouth at bedtime as needed for sleep.   Calcium Carb-Cholecalciferol (CALCIUM 600 + D PO) Take 1 tablet by mouth every evening. (Patient not taking: Reported on 05/22/2021)   clobetasol cream (TEMOVATE) 0AB-123456789% Apply 1 application topically daily. (Patient not taking: Reported on 05/22/2021)   Melatonin 1 MG CAPS Take 2 capsules (2 mg total) by mouth at bedtime. (Patient not taking: Reported on 05/22/2021)   Multiple Vitamin (MULTIVITAMIN WITH MINERALS) TABS tablet Take 1 tablet by mouth every evening. (Patient not taking: Reported on 05/22/2021)   Multiple Vitamins-Minerals (PRESERVISION AREDS 2) CAPS Take 2 capsules by mouth every evening. (Patient not taking: Reported on 05/22/2021)   vitamin C (ASCORBIC ACID) 500 MG tablet Take 500 mg by mouth daily. (Patient not taking: Reported on 05/22/2021)   No facility-administered encounter medications on file as of 05/22/2021.    Allergies (verified) Patient has no known allergies.   History: Past Medical History:  Diagnosis Date   Arthritis    [place]   1.6 lumpectomy  Lichen sclerosus    Past Surgical History:  Procedure Laterality Date   APPENDECTOMY     BOWEL RESECTION     BREAST LUMPECTOMY  2017   CARDIOVERSION N/A 11/10/2018   Procedure: CARDIOVERSION;  Surgeon: [name], MD;  [facility] ENDOSCOPY;  Service: Cardiovascular;  Laterality: N/A;   CESAREAN SECTION     HERNIA REPAIR     TONSILLECTOMY     Family History  Problem  Relation Age of Onset   Cancer Father        Lung   Cancer Sister    Social History   Socioeconomic History   Marital status: Married    Spouse name: joe   Number of children: 6   Years of education: Not on file   Highest education level: Bachelor's degree (e.g., BA, AB, BS)  Occupational History   Occupation: Visual merchandiser    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 She drinks 4 cups of coffee and 4-5 glasses of tea a day.   Social Determinants of Health   Financial Resource Strain: Low Risk    Difficulty of Paying Living Expenses: Not hard at all  Food Insecurity: No Food Insecurity   Worried About Charity fundraiser in the Last Year: Never true   Bates in the Last Year: Never true  Transportation Needs: No Transportation Needs   Lack of Transportation (Medical): No   Lack of Transportation (Non-Medical): No  Physical Activity: Inactive   Days of Exercise per Week: 0 days   Minutes of Exercise per Session: 0 min  Stress: Stress Concern Present   Feeling of Stress : To some extent  Social Connections: Moderately Integrated   Frequency of Communication with Friends and Family: More than three times a week   Frequency of Social Gatherings with Friends and Family: Never   Attends Religious Services: 1 to 4 times per year   Active Member of [employer] or Organizations: No   Attends Music therapist: Never   Marital Status: Married    Tobacco Counseling Counseling given: Not Answered Tobacco comments: 30+ years   Clinical  Intake:  Pre-visit preparation completed: Yes  Pain : 0-10 Pain Score: 1  Pain Type: Acute pain Pain Location: Abdomen Pain Orientation: Lower Pain Onset: 1 to 4 weeks ago Pain Frequency: Intermittent     BMI - recorded: 25.89 Nutritional Status: BMI 25 -29 Overweight Nutritional Risks: Unintentional weight loss Diabetes: No  How often do you need to have someone help you when you read instructions, pamphlets, or other written materials from your doctor or pharmacy?: 1 - Never  Diabetic?No  Interpreter Needed?: No  Information entered by :: Caroleen Hamman LPN   Activities of Daily Living In your present state of health, do you have any difficulty performing the following activities: 05/22/2021  Hearing? N  Vision? N  Difficulty concentrating or making decisions? N  Walking or climbing stairs? N  Dressing or bathing? N  Doing errands,  shopping? N  Preparing Food and eating ? N  Using the Toilet? N  In the past six months, have you accidently leaked urine? N  Do you have problems with loss of bowel control? N  Managing your Medications? N  Managing your Finances? N  Housekeeping or managing your Housekeeping? N  Some recent data might be hidden    Patient Care Team: Copland, Gay Filler, MD as PCP - General (Family Medicine) Sherran Needs, NP as Nurse Practitioner (Nurse Practitioner) Magrinat, Virgie Dad, MD as Consulting Physician (Oncology) Modesto Charon, PA-C as Physician Assistant (Obstetrics and Gynecology) Marcy Panning, MD as Referring Physician (Oncology) Hermelinda Medicus, MD as Consulting Physician (Internal Medicine) Beverely Pace, MD as Consulting Physician (Surgical Oncology)  Indicate any recent Medical Services you may have received from other than [facility] providers in the past year (date may be approximate).     Assessment:   This is a routine wellness examination for [place].  Hearing/Vision screen Hearing Screening - Comments:: C/o mild  hearing loss Vision Screening - Comments:: Last eye exam-2020-Dr. Digby-Has an upcoming appt  Dietary issues and exercise activities discussed: Current Exercise Habits: The patient does not participate in regular exercise at present   Goals Addressed             This Visit's Progress    Patient Stated       Drink more water       Depression Screen PHQ 2/9 Scores 05/22/2021 11/03/2020 03/20/2018  PHQ - 2 Score 1 0 2    Fall Risk Fall Risk  05/22/2021 03/15/2021 08/08/2020 05/22/2018 03/20/2018  Falls in the past year? 0 0 1 No No  Number falls in past yr: 0 1 1 - -  Injury with Fall? 0 1 0 - -  Risk for fall due to : - Impaired balance/gait - - -  Follow up Falls prevention discussed Falls evaluation completed - - -    FALL RISK PREVENTION PERTAINING TO THE HOME:  Any stairs in or around the home? Yes  If so, are there any without handrails? No  Home free of loose throw rugs in walkways, pet beds, electrical cords, etc? No patient aware of fall risk Adequate lighting in your home to reduce risk of falls? Yes   ASSISTIVE DEVICES UTILIZED TO PREVENT FALLS:  Life alert? No  Use of a cane, walker or w/c? Yes  Grab bars in the bathroom? Yes  Shower chair or bench in shower? Yes  Elevated toilet seat or a handicapped toilet? No   TIMED UP AND GO:  Was the test performed? No . Phone visit   Cognitive Function:Normal cognitive status assessed by  this Nurse Health Advisor. No abnormalities found.          Immunizations Immunization History  Administered Date(s) Administered   Fluad Quad(high Dose 65+) 06/06/2019   Influenza, High Dose Seasonal PF 06/26/2017, 05/30/2018, 06/06/2019   Influenza, Seasonal, Injecte, Preservative Fre 05/30/2018   Influenza-Unspecified 05/30/2018, 06/06/2019, 06/23/2020   PFIZER(Purple Top)SARS-COV-2 Vaccination 09/17/2019, 10/08/2019, 06/16/2020   Pneumococcal Polysaccharide-23 08/25/1987, 09/03/2012   Tdap 02/25/2019    TDAP status: Up  to date  Flu Vaccine status: Due, Education has been provided regarding the importance of this vaccine. Advised may receive this vaccine at local pharmacy or Health Dept. Aware to provide a copy of the vaccination record if obtained from local pharmacy or Health Dept. Verbalized acceptance and understanding.  Pneumococcal vaccine status: Up to date  Covid-19 vaccine status: Information provided  on how to obtain vaccines. Booster due  Qualifies for Shingles Vaccine? Yes   Zostavax completed No   Shingrix Completed?: No.    Education has been provided regarding the importance of this vaccine. Patient has been advised to call insurance company to determine out of pocket expense if they have not yet received this vaccine. Advised may also receive vaccine at local pharmacy or Health Dept. Verbalized acceptance and understanding.  Screening Tests Health Maintenance  Topic Date Due   Zoster Vaccines- Shingrix (1 of 2) Never done   MAMMOGRAM  08/03/2020   COVID-19 Vaccine (4 - Booster for [employer] series) 09/08/2020   INFLUENZA VACCINE  04/03/2021   TETANUS/TDAP  02/24/2029   DEXA SCAN  Completed   HPV VACCINES  Aged Out    Health Maintenance  Health Maintenance Due  Topic Date Due   Zoster Vaccines- Shingrix (1 of 2) Never done   MAMMOGRAM  08/03/2020   COVID-19 Vaccine (4 - Booster for [employer]izer series) 09/08/2020   INFLUENZA VACCINE  04/03/2021    Colorectal cancer screening: No longer required.   Mammogram status: Due-Patient will call & schedule  Bone Density status: Completed 08/18/2019. Results reflect: Bone density results: OSTEOPENIA. Repeat every 2 years.  Lung Cancer Screening: (Low Dose CT Chest recommended if Age 51-80 years, 30 pack-year currently smoking OR have quit w/in 15years.) does not qualify.     Additional Screening:  Hepatitis C Screening: does not qualify  Vision Screening: Recommended annual ophthalmology exams for early detection of glaucoma and other  disorders of the eye. Is the patient up to date with their annual eye exam?  No  Who is the provider or what is the name of the office in which the patient attends annual eye exams? [facility]-has an upcoming appt   Dental Screening: Recommended annual dental exams for proper oral hygiene  Community Resource Referral / Chronic Care Management: CRR required this visit?  No   CCM required this visit?  No      Plan:     I have personally reviewed and noted the following in the patient's chart:   Medical and social history Use of alcohol, tobacco or illicit drugs  Current medications and supplements including opioid prescriptions. Patient is not currently taking opioid prescriptions. Functional ability and status Nutritional status Physical activity Advanced directives List of other physicians Hospitalizations, surgeries, and ER visits in previous 12 months Vitals Screenings to include cognitive, depression, and falls Referrals and appointments  In addition, I have reviewed and discussed with patient certain preventive protocols, quality metrics, and best practice recommendations. A written personalized care plan for preventive services as well as general preventive health recommendations were provided to patient.   Due to this being a telephonic visit, the after visit summary with patients personalized plan was offered to patient via mail or my-chart.Patient would like to access on my-chart.  Marta Antu, LPN   [id number]  Nurse Health Advisor  Nurse Notes: None

## 2021-05-23 NOTE — Patient Instructions (Addendum)
It was good to see you again today, please keep me posted. If not done already I would recommend getting the new COVID-19 booster this fall  Enjoy visit with your son next week!    We can try having your increase your trazodone to up to 100 mg at bedtime as needed  You might try to find someone who is interested in "private duty" work keeping an eye on [employer]

## 2021-05-23 NOTE — Progress Notes (Signed)
[place] at [facility] [address], [place], [place] [address] [phone]  Date:  05/24/2021   Name:  Wendy Hebert   DOB:  05/08/1933   MRN:  [id number]  PCP:  Darreld Mclean, MD    Chief Complaint: Hospitalization Follow-up (05/12/21- Ileus- pt says she is feeling better/Concerns/ questions: none/Pt would like to hold on getting a flu shot a little longer/)   History of Present Illness:  Wendy Hebert is a 87 y.o. very pleasant female patient who presents with the following:  Patient seen today for in person follow-up from recent emergency room evaluation Last visit with myself was in July.  History of breast cancer and atrial fibrillation.  Her husband [name] has been in decline which is also stressful.  Wendy Hebert was evaluated in the ER on September 9 with abdominal pain-  left side abdominal pain associated with some nausea and vomiting.  She is afebrile.  She does have an elevated WBC count.  She is reportedly had a prior blockage.  CT scan shows evidence of an ileus but no definitive obstruction.  No other suggestions of infection.  Her urinalysis is clean.  Her other blood work is nonconcerning.  Her creatinine is similar to prior values.  She got very impatient and was adamant about leaving.  She did not want any further observation.  She did drink a cup of water without any nausea or vomiting.  She however would not stay for any further observation.  She was discharged home in good condition  Pt notes she is doing ok- her discomfort is better.  She is having BM once again- had one yesterday She has tended to be more constipated recently- she was having diarrhea No vomiting since she was in the ER Her appetite is not quite normal but not terrible - she notes that she tends to eat more "junk" than she probably should   COVID-19 new booster- she plans to do later on  Flu vaccine-prefers to do later  She prefers to do labs  another time   Her husband [name] continues to be a challenge for her They are trying to get him into a day program to take some of the stress off her  Hospice does come to her house twice a week and helps with him some [name] admits to feeling more depressed.  Her symptoms will come and go.  She is tearful during our interview today.  She denies any risk of self-harm or suicide She is taking 50 mg of trazodone at bedtime which helps her with sleep somewhat She is ok with increasing her dosage   Patient Active Problem List   Diagnosis Date Noted   Osteopenia 08/25/2019   Malignant neoplasm of upper-inner quadrant of right breast in female, estrogen receptor positive ([place]) 02/16/2019   Atrial fibrillation ([place]) 10/01/2018   Gait difficulty 37/90/2409   Lichen sclerosus 73/53/2992    Past Medical History:  Diagnosis Date   Arthritis    Cancer ([place]) 2017   1.6 lumpectomy   Lichen sclerosus     Past Surgical History:  Procedure Laterality Date   APPENDECTOMY     BOWEL RESECTION     BREAST LUMPECTOMY  2017   CARDIOVERSION N/A 11/10/2018   Procedure: CARDIOVERSION;  Surgeon: [name], MD;  Location: [facility];  Service: Cardiovascular;  Laterality: N/A;   [employer]  TONSILLECTOMY      Social History   Tobacco Use   Smoking status: Former   Smokeless tobacco: Never   Tobacco comments:    30+ years  Vaping Use   Vaping Use: Never used  Substance Use Topics   Alcohol use: Not Currently    Comment: occasionally   Drug use: Never    Family History  Problem Relation Age of Onset   Cancer Father        Lung   Cancer Sister     No Known Allergies  Medication list has been reviewed and updated.  Current Outpatient Medications on File Prior to Visit  Medication Sig Dispense Refill   ALPRAZolam (XANAX) 0.5 MG tablet Take 0.5-1 tablets (0.25-0.5 mg total) by mouth 2 (two) times daily as needed for anxiety. 60 tablet 1   apixaban  (ELIQUIS) 5 MG TABS tablet Take 1 tablet (5 mg total) by mouth 2 (two) times daily. 180 tablet 1   Calcium Carb-Cholecalciferol (CALCIUM 600 + D PO) Take 1 tablet by mouth every evening.     clobetasol cream (TEMOVATE) 6.71 % Apply 1 application topically daily. 90 g 0   diltiazem (CARDIZEM CD) 180 MG 24 hr capsule Take 180 mg by mouth daily.     Melatonin 1 MG CAPS Take 2 capsules (2 mg total) by mouth at bedtime. 180 capsule 3   metoprolol tartrate (LOPRESSOR) 25 MG tablet TAKE 1 AND 1/2 TABLETS BY  MOUTH IN THE MORNING AND 1  TABLET BY MOUTH IN THE  EVENING 225 tablet 3   Multiple Vitamin (MULTIVITAMIN WITH MINERALS) TABS tablet Take 1 tablet by mouth every evening.     Multiple Vitamins-Minerals (PRESERVISION AREDS 2) CAPS Take 2 capsules by mouth every evening.     traZODone (DESYREL) 50 MG tablet Take 0.5-1 tablets (25-50 mg total) by mouth at bedtime as needed for sleep. 30 tablet 3   vitamin C (ASCORBIC ACID) 500 MG tablet Take 500 mg by mouth daily.     No current facility-administered medications on file prior to visit.    Review of Systems:  As per HPI- otherwise negative.   Physical Examination: Vitals:   05/24/21 1033  BP: 122/80  Pulse: 70  Resp: 18  Temp: 97.7 F (36.5 C)  SpO2: 97%   Vitals:   05/24/21 1033  Weight: 138 lb (62.6 kg)  Height: 5\' 1"  (1.549 m)   Body mass index is 26.07 kg/m. Ideal Body Weight: Weight in (lb) to have BMI = 25: 132  GEN: no acute distress.  Mild overweight, looks her normal self HEENT: Atraumatic, Normocephalic.  Ears and Nose: No external deformity. CV: RRR, No M/G/R. No JVD. No thrill. No extra heart sounds. PULM: CTA B, no wheezes, crackles, rhonchi. No retractions. No resp. distress. No accessory muscle use. ABD: S, NT, ND, +BS. No rebound. No HSM.  Belly is benign EXTR: No c/c/e PSYCH: Normally interactive. Conversant.   Gave pt 12 weeks of eliquis samples today  Assessment and Plan: Ileus [facility])  Hospital discharge  follow-up  Adjustment disorder with depressed mood - Plan: traZODone (DESYREL) 50 MG tablet  Primary insomnia - Plan: traZODone (DESYREL) 50 MG tablet  Caregiver stress  Patient seen today for follow-up from recent ER evaluation for ileus.  She is once again having bowel movements and feeling better in this regard  Her main issue today is caregiver stressors in taking care of her husband.  We discussed possibly hiring a private duty assistance who  could spend time with [name] and get Chalene a bit more freedom.  She might look into this.  We also discussed increasing her dose of trazodone and she would like to try this.  May increase to 100 mg gradually as needed   This visit occurred during the SARS-CoV-2 public health emergency.  Safety protocols were in place, including screening questions prior to the visit, additional usage of staff PPE, and extensive cleaning of exam room while observing appropriate contact time as indicated for disinfecting solutions.   Signed Lamar Blinks, MD

## 2021-05-24 ENCOUNTER — Ambulatory Visit (INDEPENDENT_AMBULATORY_CARE_PROVIDER_SITE_OTHER): Payer: Medicare Other | Admitting: Family Medicine

## 2021-05-24 ENCOUNTER — Encounter: Payer: Self-pay | Admitting: Family Medicine

## 2021-05-24 ENCOUNTER — Other Ambulatory Visit: Payer: Self-pay

## 2021-05-24 VITALS — BP 122/80 | HR 70 | Temp 97.7°F | Resp 18 | Ht 61.0 in | Wt 138.0 lb

## 2021-05-24 DIAGNOSIS — Z09 Encounter for follow-up examination after completed treatment for conditions other than malignant neoplasm: Secondary | ICD-10-CM

## 2021-05-24 DIAGNOSIS — F5101 Primary insomnia: Secondary | ICD-10-CM

## 2021-05-24 DIAGNOSIS — K567 Ileus, unspecified: Secondary | ICD-10-CM

## 2021-05-24 DIAGNOSIS — F4321 Adjustment disorder with depressed mood: Secondary | ICD-10-CM

## 2021-05-24 DIAGNOSIS — Z636 Dependent relative needing care at home: Secondary | ICD-10-CM | POA: Diagnosis not present

## 2021-05-24 MED ORDER — TRAZODONE HCL 50 MG PO TABS: 50.0000 mg | ORAL_TABLET | Freq: Every evening | ORAL | 3 refills | Status: DC | PRN

## 2021-06-01 ENCOUNTER — Encounter: Payer: Self-pay | Admitting: Family Medicine

## 2021-06-14 ENCOUNTER — Encounter: Payer: Self-pay | Admitting: Family Medicine

## 2021-06-15 ENCOUNTER — Other Ambulatory Visit: Payer: Self-pay | Admitting: Family Medicine

## 2021-06-15 DIAGNOSIS — F419 Anxiety disorder, unspecified: Secondary | ICD-10-CM

## 2021-06-23 ENCOUNTER — Encounter: Payer: Self-pay | Admitting: Family Medicine

## 2021-07-04 ENCOUNTER — Telehealth: Payer: Self-pay | Admitting: Family Medicine

## 2021-07-04 NOTE — Telephone Encounter (Signed)
Pts son stated he needs a copy of the documentation for nursing home forms SL201, documents can be sent to [email or web address] and he can be reached at [phone]

## 2021-07-04 NOTE — Telephone Encounter (Signed)
Dr Lorelei Pont- These are not scanned into either pts charts. Looks like it was mailed again once corrected per 06/23/21 telephone note. Shouldn't he have it?

## 2021-07-09 ENCOUNTER — Encounter: Payer: Self-pay | Admitting: Family Medicine

## 2021-07-10 NOTE — Telephone Encounter (Signed)
It looks like you have seen the pt for this concern before? OV? Please advise

## 2021-07-12 DIAGNOSIS — Z1231 Encounter for screening mammogram for malignant neoplasm of breast: Secondary | ICD-10-CM | POA: Diagnosis not present

## 2021-07-12 LAB — HM MAMMOGRAPHY

## 2021-07-18 DIAGNOSIS — H26493 Other secondary cataract, bilateral: Secondary | ICD-10-CM | POA: Diagnosis not present

## 2021-07-18 DIAGNOSIS — H353132 Nonexudative age-related macular degeneration, bilateral, intermediate dry stage: Secondary | ICD-10-CM | POA: Diagnosis not present

## 2021-07-24 DIAGNOSIS — I4891 Unspecified atrial fibrillation: Secondary | ICD-10-CM | POA: Diagnosis not present

## 2021-07-24 DIAGNOSIS — R0600 Dyspnea, unspecified: Secondary | ICD-10-CM | POA: Diagnosis not present

## 2021-07-24 DIAGNOSIS — K5521 Angiodysplasia of colon with hemorrhage: Secondary | ICD-10-CM | POA: Diagnosis not present

## 2021-07-30 ENCOUNTER — Encounter: Payer: Self-pay | Admitting: Family Medicine

## 2021-08-06 ENCOUNTER — Other Ambulatory Visit: Payer: Self-pay | Admitting: Family Medicine

## 2021-08-06 DIAGNOSIS — I4891 Unspecified atrial fibrillation: Secondary | ICD-10-CM

## 2021-09-03 ENCOUNTER — Encounter: Payer: Self-pay | Admitting: Family Medicine

## 2021-09-07 ENCOUNTER — Ambulatory Visit (INDEPENDENT_AMBULATORY_CARE_PROVIDER_SITE_OTHER): Payer: Medicare Other | Admitting: Family Medicine

## 2021-09-07 VITALS — BP 124/82 | HR 70 | Resp 18 | Wt 136.0 lb

## 2021-09-07 DIAGNOSIS — Z8349 Family history of other endocrine, nutritional and metabolic diseases: Secondary | ICD-10-CM

## 2021-09-07 DIAGNOSIS — N289 Disorder of kidney and ureter, unspecified: Secondary | ICD-10-CM | POA: Diagnosis not present

## 2021-09-07 DIAGNOSIS — Z7901 Long term (current) use of anticoagulants: Secondary | ICD-10-CM

## 2021-09-07 DIAGNOSIS — I4891 Unspecified atrial fibrillation: Secondary | ICD-10-CM

## 2021-09-07 DIAGNOSIS — Z636 Dependent relative needing care at home: Secondary | ICD-10-CM

## 2021-09-07 DIAGNOSIS — D72829 Elevated white blood cell count, unspecified: Secondary | ICD-10-CM

## 2021-09-07 DIAGNOSIS — Z8639 Personal history of other endocrine, nutritional and metabolic disease: Secondary | ICD-10-CM | POA: Diagnosis not present

## 2021-09-07 NOTE — Progress Notes (Addendum)
[place] at [facility] [place], [place], [place] [address] [phone]  Date:  09/07/2021   Name:  Wendy Hebert   DOB:  06-18-1933   MRN:  [id number]  PCP:  Darreld Mclean, MD    Chief Complaint: Follow-up (She says she feels some better than in her communications via [place]. /Concerns/ questions: pt would like 2 places frozen over the L eye. /Flu shot: 05/31/2021)   History of Present Illness:  Wendy Hebert is a 88 y.o. very pleasant female patient who presents with the following:  Wendy Hebert is seen today for a follow-up visit-history of atrial fibrillation and breast cancer  Most recent visit with myself was in September when she had been seen in the ER with an ileus Also, she has been under quite a bit of stress taking care of her husband Wendy Hebert-she did move him to an assisted living center in September which has helped somewhat at least  She tries to visit him but notes he is often very unpleasant to be around  She was seen by her cardiologist, Dr. Elonda Husky with [facility] on November 21-he noted she is in permanent A. fib with Eliquis for anticoagulation, diltiazem and metoprolol for rate control  She contacted me recently with the following MyChart message so we made this appointment:  It has been physical due to mobility issues.  However, I seem a bit better since Dr. Elonda Husky prescribed  an additional b.p. med  (Losartan)   I have no idea why my b.p. runs high, in my opinion, unless it is the afib.  He okayed my taking two of the Losartan rather than one and that seems to help me in the mornings when I take both at bedtime.  I have been taking and recording my b.p. for a couple of weeks now and I am certainly not "bottoming out."  Pneumonia booster- she declines today  Shingles vaccine-recommended pharmacy Lab work done in [employer], CBC-this was in the ER when she had an ileus.  Will be prudent to recheck CBC  today  She notes that her BP may still run high sometimes- esp at bedtime.  May be about 160/90 when she checks it before bed.  However, the rest of the time her blood pressure looks good.  No chest pain or shortness of breath.  I advised her that this intermittently elevated blood pressure is unlikely to cause her any major problems.  If checking her blood pressure is causing distress or anxiety she can stop checking  She uses a cane for ambulation both in the home and outside and feels that it provides enough assistance She is still driving and living on her own  She does not have a lot of support at home - her only child- her son - lives in [place] are a strain for them-she worries about pain for her husband's assisted living in the future She does have some home aid but this is getting to be harder to afford  We discussed depression, at this time [name] does not wish to be on any medication.  She notes that normally her mood is okay, but when she starts thinking about discussing her problems with me she may become upset BP Readings from Last 3 Encounters:  09/07/21 124/82  05/24/21 122/80  05/12/21 (!) 122/91     Patient Active Problem List   Diagnosis Date Noted   Osteopenia 08/25/2019  Malignant neoplasm of upper-inner quadrant of right breast in female, estrogen receptor positive ([place]) 02/16/2019   Atrial fibrillation ([place]) 10/01/2018   Gait difficulty 40/06/2724   Lichen sclerosus 36/64/4034    Past Medical History:  Diagnosis Date   Arthritis    Cancer ([place]) 2017   1.6 lumpectomy   Lichen sclerosus     Past Surgical History:  Procedure Laterality Date   APPENDECTOMY     BOWEL RESECTION     BREAST LUMPECTOMY  2017   CARDIOVERSION N/A 11/10/2018   Procedure: CARDIOVERSION;  Surgeon: [name], MD;  Location: [facility]COPY;  Service: Cardiovascular;  Laterality: N/A;   CESAREAN SECTION     HERNIA REPAIR     TONSILLECTOMY      Social History    Tobacco Use   Smoking status: Former   Smokeless tobacco: Never   Tobacco comments:    30+ years  Vaping Use   Vaping Use: Never used  Substance Use Topics   Alcohol use: Not Currently    Comment: occasionally   Drug use: Never    Family History  Problem Relation Age of Onset   Cancer Father        Lung   Cancer Sister     No Known Allergies  Medication list has been reviewed and updated.  Current Outpatient Medications on File Prior to Visit  Medication Sig Dispense Refill   ALPRAZolam (XANAX) 0.5 MG tablet TAKE 1/2 TO 1 TABLET BY  MOUTH TWICE DAILY AS NEEDED FOR ANXIETY 60 tablet 1   apixaban (ELIQUIS) 5 MG TABS tablet Take 1 tablet (5 mg total) by mouth 2 (two) times daily. 180 tablet 1   Calcium Carb-Cholecalciferol (CALCIUM 600 + D PO) Take 1 tablet by mouth every evening.     clobetasol cream (TEMOVATE) 7.42 % Apply 1 application topically daily. 90 g 0   diltiazem (CARDIZEM CD) 180 MG 24 hr capsule Take 180 mg by mouth daily.     Melatonin 1 MG CAPS Take 2 capsules (2 mg total) by mouth at bedtime. 180 capsule 3   metoprolol tartrate (LOPRESSOR) 25 MG tablet TAKE 1 AND 1/2 TABLETS BY  MOUTH IN THE MORNING AND 1  TABLET IN THE EVENING 225 tablet 3   Multiple Vitamin (MULTIVITAMIN WITH MINERALS) TABS tablet Take 1 tablet by mouth every evening.     Multiple Vitamins-Minerals (PRESERVISION AREDS 2) CAPS Take 2 capsules by mouth every evening.     traZODone (DESYREL) 50 MG tablet Take 1-2 tablets (50-100 mg total) by mouth at bedtime as needed for sleep. 180 tablet 3   vitamin C (ASCORBIC ACID) 500 MG tablet Take 500 mg by mouth daily.     No current facility-administered medications on file prior to visit.    Review of Systems:  As per HPI- otherwise negative.   Physical Examination: Vitals:   09/07/21 1514  BP: 124/82  Pulse: 70  Resp: 18  SpO2: 97%   Vitals:   09/07/21 1514  Weight: 136 lb (61.7 kg)   Body mass index is 25.7 kg/m. Ideal Body  Weight:    GEN: no acute distress.  Appears her normal self, minimal overweight-overall looks very good for age HEENT: Atraumatic, Normocephalic.  Ears and Nose: No external deformity. CV: rate controlled a fib, No M/G/R. No JVD. No thrill. No extra heart sounds. PULM: CTA B, no wheezes, crackles, rhonchi. No retractions. No resp. distress. No accessory muscle use. ABD: S, NT, ND EXTR: No c/c/e PSYCH: Normally  interactive. Conversant.  Ambulation with cane Patient request cryotherapy to 2 nonspecific skin lesions over her right eye-do not appear consistent with skin cancer.  Possibly thin seborrheic keratoses Assessment and Plan: Caregiver stress  Atrial fibrillation, unspecified type (HCC)  Chronic anticoagulation - Plan: Basic metabolic panel, CBC  Renal insufficiency - Plan: Basic metabolic panel  Family history of B12 deficiency  History of non anemic vitamin B12 deficiency - Plan: B12 and Folate Panel  Leukocytosis, unspecified type - Plan: CBC  Patient seen today for follow-up.  She continues to be under stress as a caregiver and also financial strain.  Offered support She continues to be in atrial fib, she is anticoagulated and rate controlled appropriately Follow-up on leukocytosis with CBC Will plan further follow- up pending labs.  Signed Lamar Blinks, MD  Addendum 1/6, received her labs as below.  Message to patient  Results for orders placed or performed in visit on 25/42/70  Basic metabolic panel  Result Value Ref Range   Sodium 139 135 - 145 mEq/L   Potassium 4.2 3.5 - 5.1 mEq/L   Chloride 104 96 - 112 mEq/L   CO2 24 19 - 32 mEq/L   Glucose, Bld 144 (H) 70 - 99 mg/dL   BUN 24 (H) 6 - 23 mg/dL   Creatinine, Ser 1.07 0.40 - 1.20 mg/dL   GFR 47.08 (L) >60.00 mL/min   Calcium 8.9 8.4 - 10.5 mg/dL  CBC  Result Value Ref Range   WBC 9.1 4.0 - 10.5 K/uL   RBC 4.62 3.87 - 5.11 Mil/uL   Platelets 294.0 150.0 - 400.0 K/uL   Hemoglobin 14.3 12.0 - 15.0 g/dL    HCT 43.3 36.0 - 46.0 %   MCV 93.8 78.0 - 100.0 fl   MCHC 32.9 30.0 - 36.0 g/dL   RDW 14.9 11.5 - 15.5 %  B12 and Folate Panel  Result Value Ref Range   Vitamin B-12 673 211 - 911 pg/mL   Folate >24.2 >5.9 ng/mL

## 2021-09-07 NOTE — Patient Instructions (Addendum)
It was good to see you again today Please consider getting the Shingrix vaccine series at your pharmacy if not done already  I will be in touch with your labs asap Please see me ina about 4 months

## 2021-09-08 ENCOUNTER — Encounter: Payer: Self-pay | Admitting: Family Medicine

## 2021-09-08 LAB — CBC
HCT: 43.3 % (ref 36.0–46.0)
Hemoglobin: 14.3 g/dL (ref 12.0–15.0)
MCHC: 32.9 g/dL (ref 30.0–36.0)
MCV: 93.8 fl (ref 78.0–100.0)
Platelets: 294 10*3/uL (ref 150.0–400.0)
RBC: 4.62 Mil/uL (ref 3.87–5.11)
RDW: 14.9 % (ref 11.5–15.5)
WBC: 9.1 10*3/uL (ref 4.0–10.5)

## 2021-09-08 LAB — BASIC METABOLIC PANEL
BUN: 24 mg/dL — ABNORMAL HIGH (ref 6–23)
CO2: 24 mEq/L (ref 19–32)
Calcium: 8.9 mg/dL (ref 8.4–10.5)
Chloride: 104 mEq/L (ref 96–112)
Creatinine, Ser: 1.07 mg/dL (ref 0.40–1.20)
GFR: 47.08 mL/min — ABNORMAL LOW (ref >60.00)
Glucose, Bld: 144 mg/dL — ABNORMAL HIGH (ref 70–99)
Potassium: 4.2 mEq/L (ref 3.5–5.1)
Sodium: 139 mEq/L (ref 135–145)

## 2021-09-08 LAB — B12 AND FOLATE PANEL
Folate: >24.2 ng/mL (ref >5.9)
Vitamin B-12: 673 pg/mL (ref 211–911)

## 2021-09-11 ENCOUNTER — Encounter: Payer: Self-pay | Admitting: Family Medicine

## 2021-09-22 DIAGNOSIS — I4891 Unspecified atrial fibrillation: Secondary | ICD-10-CM | POA: Diagnosis not present

## 2021-09-22 DIAGNOSIS — K5521 Angiodysplasia of colon with hemorrhage: Secondary | ICD-10-CM | POA: Diagnosis not present

## 2021-09-22 DIAGNOSIS — R0600 Dyspnea, unspecified: Secondary | ICD-10-CM | POA: Diagnosis not present

## 2021-12-11 ENCOUNTER — Other Ambulatory Visit (HOSPITAL_COMMUNITY): Payer: Self-pay | Admitting: Family Medicine

## 2021-12-11 DIAGNOSIS — I4891 Unspecified atrial fibrillation: Secondary | ICD-10-CM

## 2021-12-26 ENCOUNTER — Encounter: Payer: Self-pay | Admitting: Family Medicine

## 2021-12-26 ENCOUNTER — Ambulatory Visit: Payer: Self-pay

## 2021-12-26 NOTE — Telephone Encounter (Signed)
?  Chief Complaint: Wanted information regarding ed visit. ?Symptoms:  ?Frequency:  ?Pertinent Negatives: Patient denies  ?Disposition: '[]'$ ED /'[]'$ Urgent Care (no appt availability in office) / '[]'$ Appointment(In office/virtual)/ '[]'$  [facility]/ '[]'$ Home Care/ '[]'$ Refused Recommended Disposition /'[]'$ [facility]/ '[x]'$  Follow-up with PCP ?Additional Notes: Read to pt , "CT scan shows evidence of an ileus but no definitive obstruction." From Ed visit. PT to follow up with PCP. ? ? ? ? ? ? ?Summary: pt wanted info on a hospital discharge last yr  ? FU with pt at [phone] Pt has called in and was in the hospital last year and forgotten what was wrong with her . She said it was a funny name and wants to know what it was so she can look it up. I advised her that her PCP would be able to assist with that information as she is made aware of these visits. She states they are closed now and she wants to know so she can look it up now.  She wanted to go to the hospital and get the discharge papers.Marland KitchenMarland KitchenI did advise her that disclosure of certain info may be at the discretion of her dr. Yet Adamantly wants a fu call.[phone]   ?  ? ?Reason for Disposition ? [1] Follow-up call to recent contact AND [2] information only call, no triage required ? ?Answer Assessment - Initial Assessment Questions ?1. REASON FOR CALL or QUESTION: "What is your reason for calling today?" or "How can I best help you?" or "What question do you have that I can help answer?" ?    Pt wanted to Diagnosis for recent Ed  visit. 05/12/2021 ? ?Protocols used: Information Only Call - No Triage-A-AH ? ?

## 2021-12-27 ENCOUNTER — Ambulatory Visit (INDEPENDENT_AMBULATORY_CARE_PROVIDER_SITE_OTHER): Payer: Medicare Other | Admitting: Family Medicine

## 2021-12-27 ENCOUNTER — Encounter: Payer: Self-pay | Admitting: Family Medicine

## 2021-12-27 VITALS — BP 108/73 | HR 59 | Temp 97.6°F | Ht 61.0 in | Wt 139.0 lb

## 2021-12-27 DIAGNOSIS — L989 Disorder of the skin and subcutaneous tissue, unspecified: Secondary | ICD-10-CM | POA: Diagnosis not present

## 2021-12-27 DIAGNOSIS — S39011A Strain of muscle, fascia and tendon of abdomen, initial encounter: Secondary | ICD-10-CM | POA: Diagnosis not present

## 2021-12-27 NOTE — Patient Instructions (Addendum)
Ok to take a few Aleve here or there.  ? ?OK to take Tylenol 1000 mg (2 extra strength tabs) or 975 mg (3 regular strength tabs) every 6 hours as needed. ? ?Ice/cold pack over area for 10-15 min twice daily. ? ?Heat (pad or rice pillow in microwave) over affected area, 10-15 minutes twice daily.  ? ?It is extremely unlikely that this is an ileus or bowel obstruction. ? ?Send message if not improving. Take it easy with your core exercises.  ? ?If this skin lesion does not resolve, please follow up with Dr. Lorelei Pont.  ? ?Let us know if you need anything. ?

## 2021-12-27 NOTE — Progress Notes (Signed)
Chief Complaint  ?Patient presents with  ? Back Pain  ? ? ?Subjective: ?Patient is a 88 y.o. female here for abdominal pain. ? ?Over the past few days, the patient has been increasing her repetitions for some core workouts.  She had back pain initially and now it is in her left groin region.  No bruising, redness, or swelling.  No neurologic signs or symptoms.  It does hurt when she walks.  She has a history of an ileus in the fall 2022.  She has not been taking anything at home so far.  She has had 3 bowel movements since yesterday including 1 today.  She is passing gas.  She denies any fevers, nausea or vomiting. ? ?2 weeks ago, she noticed a scaly lesion on the top of her head.  It does not itch, hurt, or have any drainage associated with it.  She has not tried to put anything on it so far.  No close contacts with similar symptoms.  No new topicals.  She has had previous lesions frozen by her regular PCP. ? ?Past Medical History:  ?Diagnosis Date  ? Arthritis   ? Cancer [facility]) 2017  ? 1.6 lumpectomy  ? Lichen sclerosus   ? ? ?Objective: ?BP 108/73   Pulse (!) 59   Temp 97.6 ?F (36.4 ?C) (Oral)   Ht '5\' 1"'$  (1.549 m)   Wt 139 lb (63 kg)   SpO2 97%   BMI 26.26 kg/m?  ?General: Awake, appears stated age ?Skin: See below ?Heart: RRR ?Lungs: CTAB, no rales, wheezes or rhonchi. No accessory muscle use ?Abdomen: Bowel sounds present, soft, nontender, nondistended.  No masses or organomegaly. ?MSK: No tenderness over the proximal quad on the left, negative [place] ?Psych: Age appropriate judgment and insight, normal affect and mood ? ? ? ?Procedure note: cryotherapy ?Verbal consent obtained ?1 skin lesions treated ?Liquid nitrogen was applied via a thin spray creating an ice ball with 1-2 mm corona surrounding the lesion ?The patient tolerated the procedure well ?There were no immediate complications noted ? ?Assessment and Plan: ?Strain of abdominal wall, initial encounter ? ?Skin lesion - Plan: PR DESTRUCTION  BENIGN LESIONS UP TO 14 ? ?Her main reason for coming in was to rule out any ileus.  She is having normal bowel movements and passing gas.  She has not had any nausea or vomiting.  Here abdominal exam is unremarkable.  She does not have any pain unless she is walking.  She will take Aleve sparingly in addition to Tylenol.  Ice, heat, gentle stretching.  Avoid aggravating activities such as her core work.  Follow-up as needed. ?Could be a verruca or irritated seborrheic keratosis.  If it does not improve with the liquid nitrogen, would consider shaving it.  She can follow-up with her regular PCP to do this. ?The patient voiced understanding and agreement to the plan. ? ?Shelda Pal, DO ?12/27/21  ?4:57 PM ? ? ? ? ?

## 2022-01-24 ENCOUNTER — Encounter: Payer: Self-pay | Admitting: Family Medicine

## 2022-01-29 NOTE — Progress Notes (Unsigned)
[place] at [facility] [address], [place],  [address] [phone]  Date:  01/31/2022   Name:  [name]   DOB:  [birth year]   MRN:  [id number]  PCP:  Darreld Mclean, MD    Chief Complaint: No chief complaint on file.   History of Present Illness:  Wendy Hebert is a 88 y.o. very pleasant female patient who presents with the following:  Patient seen today with concern of abdominal pain Last seen by myself in January History of atrial fibrillation, breast cancer She had to move her husband [name] to an assisted living center last year which has been difficult She contacted me last week with concern of abdominal pain-she first noticed this last fall and was seen in the ER with an ileus back in September CT scan abdomen pelvis May 12, 2021 Patient Active Problem List   Diagnosis Date Noted   Osteopenia 08/25/2019   Malignant neoplasm of upper-inner quadrant of right breast in female, estrogen receptor positive ([place]) 02/16/2019   Atrial fibrillation ([place]) 10/01/2018   Gait difficulty 29/52/8413   Lichen sclerosus 24/40/1027    Past Medical History:  Diagnosis Date   Arthritis    Cancer ([place]) 2017   1.6 lumpectomy   Lichen sclerosus     Past Surgical History:  Procedure Laterality Date   APPENDECTOMY     BOWEL RESECTION     BREAST LUMPECTOMY  2017   CARDIOVERSION N/A 11/10/2018   Procedure: CARDIOVERSION;  Surgeon: [name], MD;  Location: [place];  Service: Cardiovascular;  Laterality: N/A;   CESAREAN SECTION     HERNIA REPAIR     TONSILLECTOMY      Social History   Tobacco Use   Smoking status: Former   Smokeless tobacco: Never   Tobacco comments:    30+ years  Vaping Use   Vaping Use: Never used  Substance Use Topics   Alcohol use: Not Currently    Comment: occasionally   Drug use: Never    Family History  Problem Relation Age of Onset   Cancer Father        Lung    Cancer Sister     No Known Allergies  Medication list has been reviewed and updated.  Current Outpatient Medications on File Prior to Visit  Medication Sig Dispense Refill   ALPRAZolam (XANAX) 0.5 MG tablet TAKE 1/2 TO 1 TABLET BY  MOUTH TWICE DAILY AS NEEDED FOR ANXIETY 60 tablet 1   Calcium Carb-Cholecalciferol (CALCIUM 600 + D PO) Take 1 tablet by mouth every evening.     clobetasol cream (TEMOVATE) 2.53 % Apply 1 application topically daily. 90 g 0   diltiazem (CARDIZEM CD) 180 MG 24 hr capsule Take 180 mg by mouth daily.     ELIQUIS 5 MG TABS tablet TAKE 1 TABLET BY MOUTH  TWICE DAILY 180 tablet 3   Melatonin 1 MG CAPS Take 2 capsules (2 mg total) by mouth at bedtime. 180 capsule 3   metoprolol tartrate (LOPRESSOR) 25 MG tablet TAKE 1 AND 1/2 TABLETS BY  MOUTH IN THE MORNING AND 1  TABLET IN THE EVENING 225 tablet 3   Multiple Vitamin (MULTIVITAMIN WITH MINERALS) TABS tablet Take 1 tablet by mouth every evening.     Multiple Vitamins-Minerals (PRESERVISION AREDS 2) CAPS Take 2 capsules by mouth every evening.     traZODone (DESYREL) 50 MG tablet Take 1-2 tablets (50-100 mg total)  by mouth at bedtime as needed for sleep. 180 tablet 3   vitamin C (ASCORBIC ACID) 500 MG tablet Take 500 mg by mouth daily.     No current facility-administered medications on file prior to visit.    Review of Systems:  As per HPI- otherwise negative.   Physical Examination: There were no vitals filed for this visit. There were no vitals filed for this visit. There is no height or weight on file to calculate BMI. Ideal Body Weight:    GEN: no acute distress. HEENT: Atraumatic, Normocephalic.  Ears and Nose: No external deformity. CV: RRR, No M/G/R. No JVD. No thrill. No extra heart sounds. PULM: CTA B, no wheezes, crackles, rhonchi. No retractions. No resp. distress. No accessory muscle use. ABD: S, NT, ND, +BS. No rebound. No HSM. EXTR: No c/c/e PSYCH: Normally interactive. Conversant.  '  Assessment and Plan: ***  Signed Lamar Blinks, MD

## 2022-01-31 ENCOUNTER — Ambulatory Visit (INDEPENDENT_AMBULATORY_CARE_PROVIDER_SITE_OTHER): Payer: Medicare Other | Admitting: Family Medicine

## 2022-01-31 ENCOUNTER — Encounter: Payer: Self-pay | Admitting: Family Medicine

## 2022-01-31 VITALS — BP 120/78 | HR 55 | Temp 97.8°F | Resp 16 | Ht 61.0 in | Wt 139.0 lb

## 2022-01-31 DIAGNOSIS — L821 Other seborrheic keratosis: Secondary | ICD-10-CM

## 2022-01-31 DIAGNOSIS — I4891 Unspecified atrial fibrillation: Secondary | ICD-10-CM | POA: Diagnosis not present

## 2022-01-31 DIAGNOSIS — R1084 Generalized abdominal pain: Secondary | ICD-10-CM

## 2022-01-31 DIAGNOSIS — R6889 Other general symptoms and signs: Secondary | ICD-10-CM

## 2022-01-31 DIAGNOSIS — Z636 Dependent relative needing care at home: Secondary | ICD-10-CM | POA: Diagnosis not present

## 2022-01-31 NOTE — Patient Instructions (Addendum)
Good to see you today Let me know if your pain returns Please reschedule with cardiology at your convenience   Assuming all is well please see me in about 4 months

## 2022-02-14 ENCOUNTER — Other Ambulatory Visit: Payer: Self-pay | Admitting: Family Medicine

## 2022-02-14 DIAGNOSIS — F419 Anxiety disorder, unspecified: Secondary | ICD-10-CM

## 2022-02-15 ENCOUNTER — Other Ambulatory Visit: Payer: Self-pay | Admitting: Family Medicine

## 2022-02-15 DIAGNOSIS — F419 Anxiety disorder, unspecified: Secondary | ICD-10-CM

## 2022-02-17 ENCOUNTER — Encounter: Payer: Self-pay | Admitting: Family Medicine

## 2022-02-27 NOTE — Progress Notes (Signed)
[facility] at [facility] [address], [place], [place] [address] [phone]  Date:  03/01/2022   Name:  Wendy Hebert   DOB:  [birth year]   MRN:  [id number]  PCP:  Darreld Mclean, MD    Chief Complaint: skin lesion (Not any better, would like to have biopsy done. )   History of Present Illness:  Wendy Hebert is a 87 y.o. very pleasant female patient who presents with the following:  Patient seen today with concern of a skin problem on her leg-history of atrial fibrillation, breast cancer, recent ileus Most recent visit with myself was about 1 month ago for follow-up In the interim unfortunately her husband passed away from dementia  Patient Active Problem List   Diagnosis Date Noted   Osteopenia 08/25/2019   Malignant neoplasm of upper-inner quadrant of right breast in female, estrogen receptor positive ([place]) 02/16/2019   Atrial fibrillation ([place]) 10/01/2018   Gait difficulty 93/23/5573   Lichen sclerosus 22/10/5425    Past Medical History:  Diagnosis Date   Arthritis    Cancer ([place]) 2017   1.6 lumpectomy   Lichen sclerosus     Past Surgical History:  Procedure Laterality Date   APPENDECTOMY     BOWEL RESECTION     BREAST LUMPECTOMY  2017   CARDIOVERSION N/A 11/10/2018   Procedure: CARDIOVERSION;  Surgeon: [name], MD;  Location: [place];  Service: Cardiovascular;  Laterality: N/A;   CESAREAN SECTION     HERNIA REPAIR     TONSILLECTOMY      Social History   Tobacco Use   Smoking status: Former   Smokeless tobacco: Never   Tobacco comments:    30+ years  Vaping Use   Vaping Use: Never used  Substance Use Topics   Alcohol use: Not Currently    Comment: occasionally   Drug use: Never    Family History  Problem Relation Age of Onset   Cancer Father        Lung   Cancer Sister     No Known Allergies  Medication list has been reviewed and updated.  Current Outpatient  Medications on File Prior to Visit  Medication Sig Dispense Refill   ALPRAZolam (XANAX) 0.5 MG tablet TAKE 1/2 TO 1 TABLET BY  MOUTH TWICE DAILY AS NEEDED FOR ANXIETY 60 tablet 1   Calcium Carb-Cholecalciferol (CALCIUM 600 + D PO) Take 1 tablet by mouth every evening.     clobetasol cream (TEMOVATE) 0.62 % Apply 1 application topically daily. 90 g 0   diltiazem (CARDIZEM CD) 180 MG 24 hr capsule Take 180 mg by mouth daily.     ELIQUIS 5 MG TABS tablet TAKE 1 TABLET BY MOUTH  TWICE DAILY 180 tablet 3   Melatonin 1 MG CAPS Take 2 capsules (2 mg total) by mouth at bedtime. 180 capsule 3   metoprolol tartrate (LOPRESSOR) 25 MG tablet TAKE 1 AND 1/2 TABLETS BY  MOUTH IN THE MORNING AND 1  TABLET IN THE EVENING 225 tablet 3   Multiple Vitamin (MULTIVITAMIN WITH MINERALS) TABS tablet Take 1 tablet by mouth every evening.     Multiple Vitamins-Minerals (PRESERVISION AREDS 2) CAPS Take 2 capsules by mouth every evening.     traZODone (DESYREL) 50 MG tablet Take 1-2 tablets (50-100 mg total) by mouth at bedtime as needed for sleep. 180 tablet 3   vitamin C (ASCORBIC ACID) 500 MG tablet Take 500 mg  by mouth daily.     No current facility-administered medications on file prior to visit.    Review of Systems:  As per HPI- otherwise negative.   Physical Examination: Vitals:   03/01/22 1434  BP: 122/76  Pulse: (!) 55  Resp: 18  Temp: 97.6 F (36.4 C)  SpO2: 96%   Vitals:   03/01/22 1434  Weight: 140 lb 9.6 oz (63.8 kg)  Height: '5\' 1"'$  (1.549 m)   Body mass index is 26.57 kg/m. Ideal Body Weight: Weight in (lb) to have BMI = 25: 132  GEN: No acute distress; alert,appropriate. PULM: Breathing comfortably in no respiratory distress PSYCH: Normally interactive.  Patient has a nodular, fibrotic skin lesion approximate 1.5 cm in diameter on the posterior of her right leg.  She would like to have this biopsied or perhaps removed entirely    Assessment and Plan: Skin lesion As above,  patient has skin lesion on her posterior right leg.  I scheduled her for an appointment about 2 weeks to remove this, we do not have time today.  Patient states understanding and agreement  Signed Lamar Blinks, MD

## 2022-03-01 ENCOUNTER — Ambulatory Visit (INDEPENDENT_AMBULATORY_CARE_PROVIDER_SITE_OTHER): Payer: Medicare Other | Admitting: Family Medicine

## 2022-03-01 VITALS — BP 122/76 | HR 55 | Temp 97.6°F | Resp 18 | Ht 61.0 in | Wt 140.6 lb

## 2022-03-01 DIAGNOSIS — L989 Disorder of the skin and subcutaneous tissue, unspecified: Secondary | ICD-10-CM

## 2022-03-01 NOTE — Patient Instructions (Signed)
Please see me as planned to remove the lesion from your leg- I can send it to pathology

## 2022-03-03 ENCOUNTER — Encounter: Payer: Self-pay | Admitting: Family Medicine

## 2022-03-03 DIAGNOSIS — M7989 Other specified soft tissue disorders: Secondary | ICD-10-CM | POA: Diagnosis not present

## 2022-03-03 DIAGNOSIS — M009 Pyogenic arthritis, unspecified: Secondary | ICD-10-CM | POA: Diagnosis not present

## 2022-03-14 NOTE — Progress Notes (Signed)
[place] at [facility] [address], [place], [place] [address] [phone]  Date:  03/19/2022   Name:  Wendy Hebert   DOB:  1932-12-02   MRN:  [id number]  PCP:  Darreld Mclean, MD    Chief Complaint: Skin leison removal (Posterior Right Leg- itches. )   History of Present Illness:  Wendy Hebert is a 88 y.o. very pleasant female patient who presents with the following:  Patient is seen today with concern of a skin lesion on her RIGHT posterior calf -she has tried to get in with dermatology but no timely appointments are available She would like me to go ahead and remove this as she is somewhat concerned about skin cancer  Today pt notes the skin lesion persists, she gives consent for removal.  Discussed risks and benefits of removing the skin lesion and she elects to continue  She is taking Eliquis, no other blood thinners.  Eliquis is used for atrial fibrillation thrombosis prevention Given the minor nature of planned skin surgery it is appropriate for her to continue taking Eliquis perioperatively    Patient Active Problem List   Diagnosis Date Noted   Osteopenia 08/25/2019   Malignant neoplasm of upper-inner quadrant of right breast in female, estrogen receptor positive ([place]) 02/16/2019   Atrial fibrillation ([place]) 10/01/2018   Gait difficulty 29/52/8413   Lichen sclerosus 24/40/1027    Past Medical History:  Diagnosis Date   Arthritis    Cancer ([place]) 2017   1.6 lumpectomy   Lichen sclerosus     Past Surgical History:  Procedure Laterality Date   APPENDECTOMY     BOWEL RESECTION     BREAST LUMPECTOMY  2017   CARDIOVERSION N/A 11/10/2018   Procedure: CARDIOVERSION;  Surgeon: [name], MD;  Location: [facility];  Service: Cardiovascular;  Laterality: N/A;   CESAREAN SECTION     HERNIA REPAIR     TONSILLECTOMY      Social History   Tobacco Use   Smoking status: Former   Smokeless  tobacco: Never   Tobacco comments:    30+ years  Vaping Use   Vaping Use: Never used  Substance Use Topics   Alcohol use: Not Currently    Comment: occasionally   Drug use: Never    Family History  Problem Relation Age of Onset   Cancer Father        Lung   Cancer Sister     No Known Allergies  Medication list has been reviewed and updated.  Current Outpatient Medications on File Prior to Visit  Medication Sig Dispense Refill   ALPRAZolam (XANAX) 0.5 MG tablet TAKE 1/2 TO 1 TABLET BY  MOUTH TWICE DAILY AS NEEDED FOR ANXIETY 60 tablet 1   Calcium Carb-Cholecalciferol (CALCIUM 600 + D PO) Take 1 tablet by mouth every evening.     clobetasol cream (TEMOVATE) 2.53 % Apply 1 application topically daily. 90 g 0   diltiazem (CARDIZEM CD) 180 MG 24 hr capsule Take 180 mg by mouth daily.     ELIQUIS 5 MG TABS tablet TAKE 1 TABLET BY MOUTH  TWICE DAILY 180 tablet 3   Melatonin 1 MG CAPS Take 2 capsules (2 mg total) by mouth at bedtime. 180 capsule 3   metoprolol tartrate (LOPRESSOR) 25 MG tablet TAKE 1 AND 1/2 TABLETS BY  MOUTH IN THE MORNING AND 1  TABLET IN THE EVENING 225 tablet 3   Multiple  Vitamin (MULTIVITAMIN WITH MINERALS) TABS tablet Take 1 tablet by mouth every evening.     Multiple Vitamins-Minerals (PRESERVISION AREDS 2) CAPS Take 2 capsules by mouth every evening.     traZODone (DESYREL) 50 MG tablet Take 1-2 tablets (50-100 mg total) by mouth at bedtime as needed for sleep. 180 tablet 3   vitamin C (ASCORBIC ACID) 500 MG tablet Take 500 mg by mouth daily.     No current facility-administered medications on file prior to visit.    Review of Systems:  As per HPI- otherwise negative.   Physical Examination: Vitals:   03/19/22 1053  BP: 122/60  Pulse: 65  Resp: 18  Temp: 97.8 F (36.6 C)  SpO2: 98%   Vitals:   03/19/22 1053  Weight: 139 lb (63 kg)  Height: '5\' 1"'$  (1.549 m)   Body mass index is 26.26 kg/m. Ideal Body Weight: Weight in (lb) to have BMI = 25:  132  GEN: No acute distress; alert,appropriate. PULM: Breathing comfortably in no respiratory distress PSYCH: Normally interactive.  Raised, dome-shaped firm nodule on posterior right calf, measures approximately 12 mm x 10 mm  Verbal consent for surgical removal obtained Anesthesia achieved with 3 mL of 1% lidocaine with epinephrine Area prepped with Betadine, sterile drapes and technique used Lesion removed using a 10 blade scalpel, specimen to pathology Closed skin defect using 1 horizontal mattress and 2 simple erupted sutures, 6-0 nylon suture Patient tolerated well with minimal blood loss, less than 5 mL.  Hemostasis achieved with suture  Applied dressing  Assessment and Plan: Skin lesion of right leg - Plan: Dermatology pathology  Patient seen today for removal of skin lesion on her right posterior calf.  Specimen sent to pathology Discussed and provided written wound care instructions.  Leave area clean and dry until tomorrow, then can bathe as usual and apply Band-Aid.  Asked her to watch out for any sign of infection, contact me right away if any concerns.  If any bleeding apply pressure for 10 to 15 minutes, if will not resolve seek care Appointment in 10 days for suture removal  Called pt at 7pm to check on her- she reports no bleeding from her skin biopsy site.  Offered to have her hold this evening's dose of Eliquis if she likes-however, patient notes as she is not bleeding she is more concerned about a possible thrombotic event from atrial fib and she prefers to take it which is quite reasonable  Signed Lamar Blinks, MD

## 2022-03-19 ENCOUNTER — Ambulatory Visit (INDEPENDENT_AMBULATORY_CARE_PROVIDER_SITE_OTHER): Payer: Medicare Other | Admitting: Family Medicine

## 2022-03-19 ENCOUNTER — Ambulatory Visit: Payer: Medicare Other | Admitting: Family Medicine

## 2022-03-19 VITALS — BP 122/60 | HR 65 | Temp 97.8°F | Resp 18 | Ht 61.0 in | Wt 139.0 lb

## 2022-03-19 DIAGNOSIS — L989 Disorder of the skin and subcutaneous tissue, unspecified: Secondary | ICD-10-CM | POA: Diagnosis not present

## 2022-03-19 DIAGNOSIS — C44722 Squamous cell carcinoma of skin of right lower limb, including hip: Secondary | ICD-10-CM | POA: Diagnosis not present

## 2022-03-19 NOTE — Patient Instructions (Signed)
Let me know if any sign of infection or other concern See me for suture removal next week Keep covered with a band- aid If any significant bleeding apply pressure for 10- 15 minutes- call if this does not resolve!

## 2022-03-20 IMAGING — CT CT ABD-PELV W/O CM
2 of 4 series · 16 of 46 positions shown, 18 images · non-contrast
Comparison: CT 09/23/2018

CLINICAL DATA: Lower pain with nausea

EXAM:
CT ABDOMEN AND PELVIS WITHOUT CONTRAST
TECHNIQUE: Multidetector CT imaging of the abdomen and pelvis was performed
following the standard protocol without IV contrast.

[Series 2: axial st · axial · 0.83mm/px · z∈[+544,+918]mm · 13 of 83 slices shown, 15 images]
[im 4/83  soft-tissue]
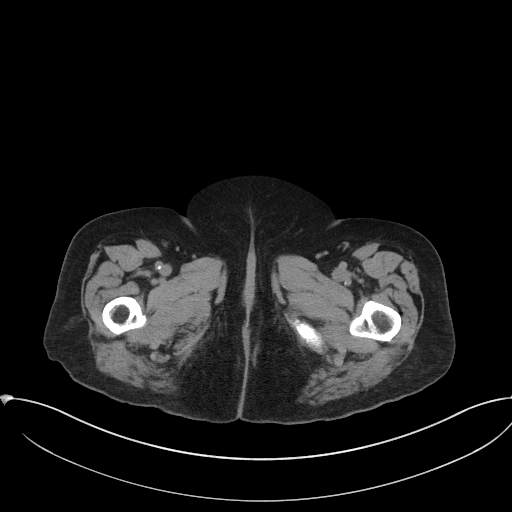
[im 4/83  bone]
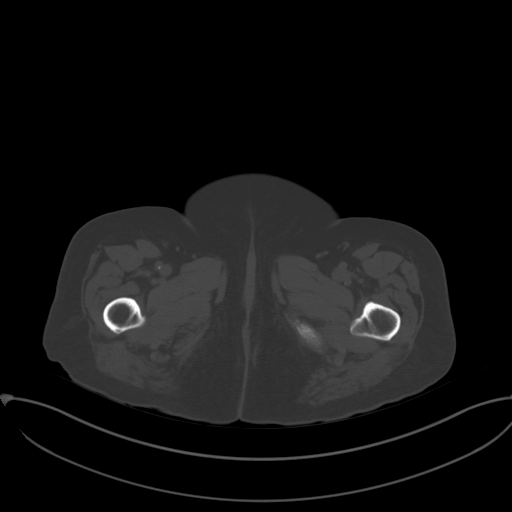
[im 10/83  soft-tissue]
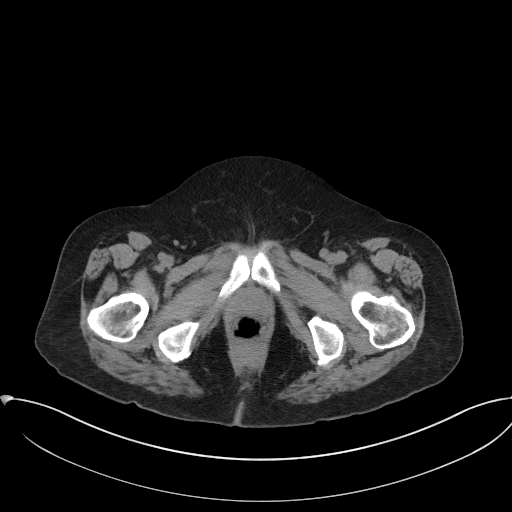
[im 17/83  soft-tissue]
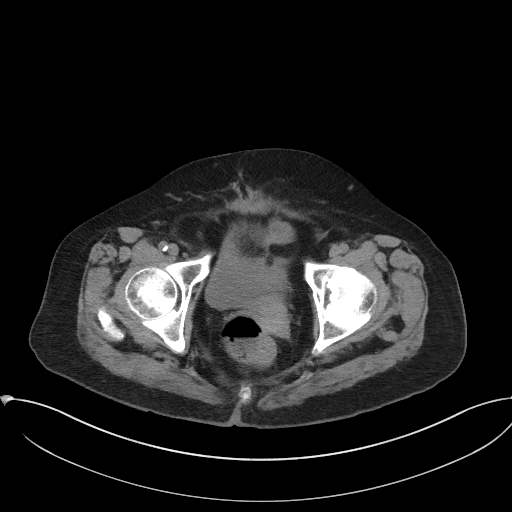
[im 23/83  soft-tissue]
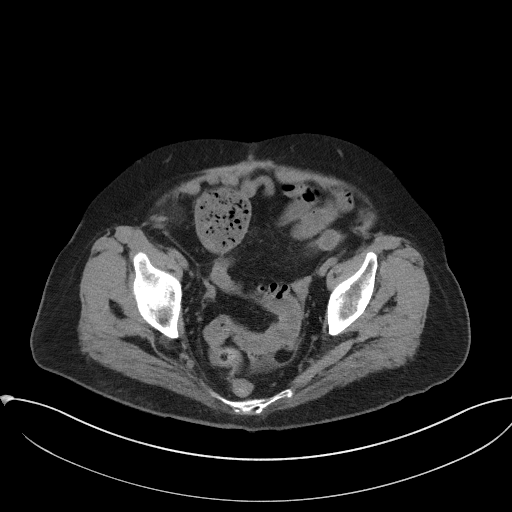
[im 30/83  soft-tissue]
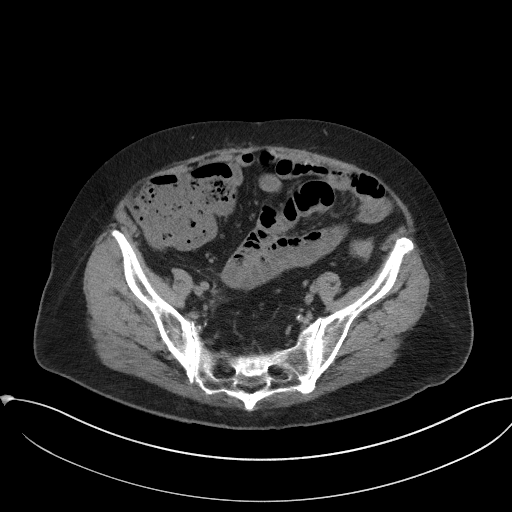
[im 37/83  soft-tissue]
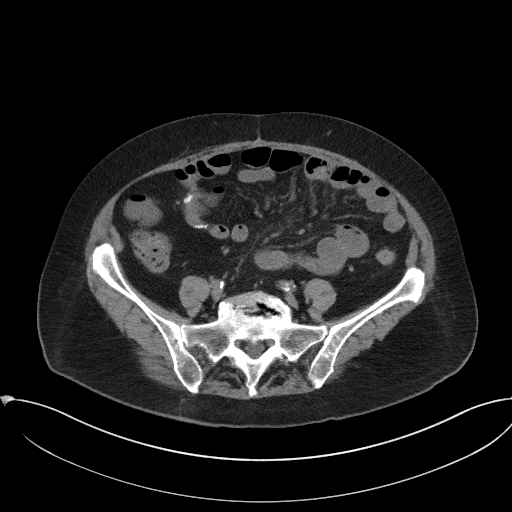
[im 43/83  soft-tissue]
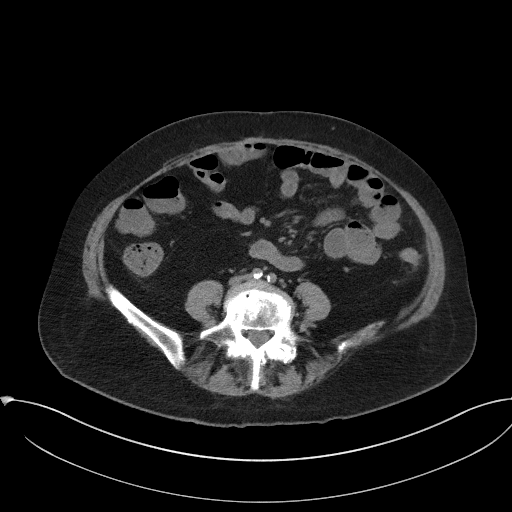
[im 46/83  soft-tissue]
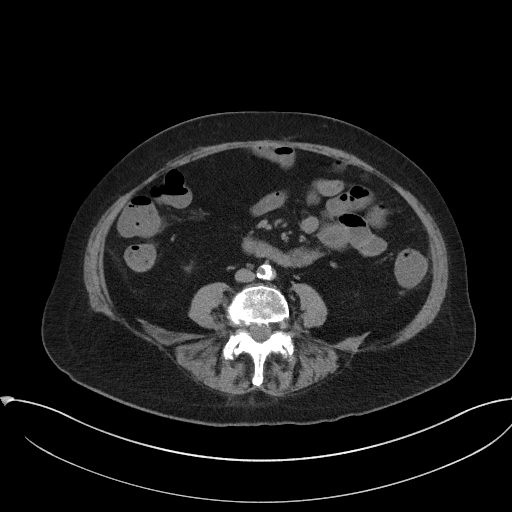
[im 53/83  soft-tissue]
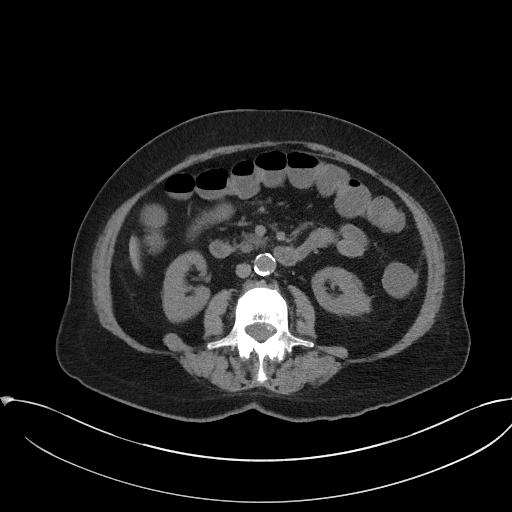
[im 53/83  bone]
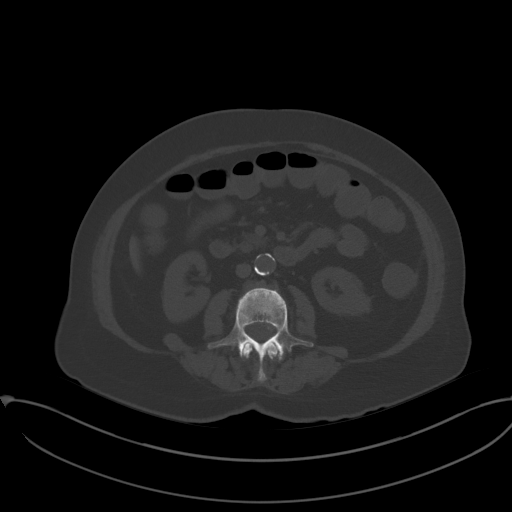
[im 60/83  soft-tissue]
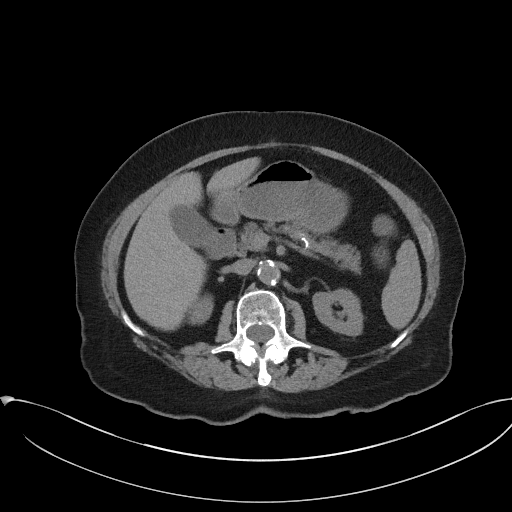
[im 66/83  soft-tissue]
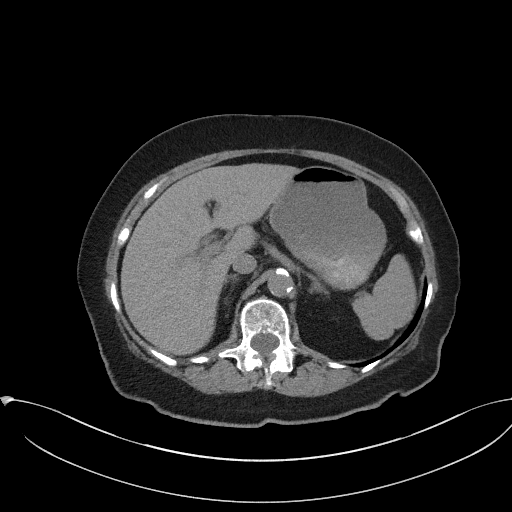
[im 73/83  soft-tissue]
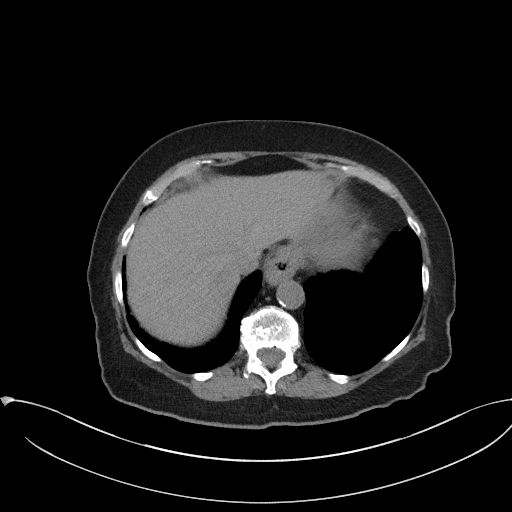
[im 79/83  soft-tissue]
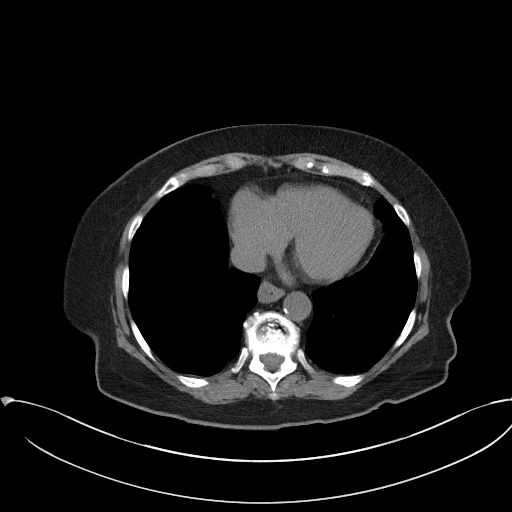

[Series 5: coronal st · coronal · 0.71mm/px · 3 of 101 slices shown]
[im 34/101  soft-tissue]
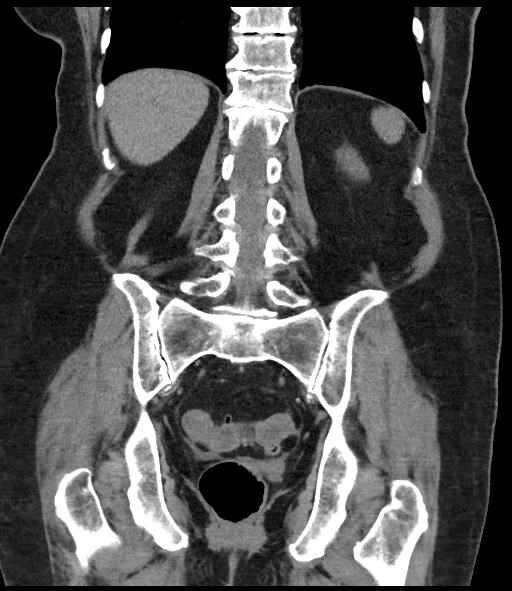
[im 45/101  soft-tissue]
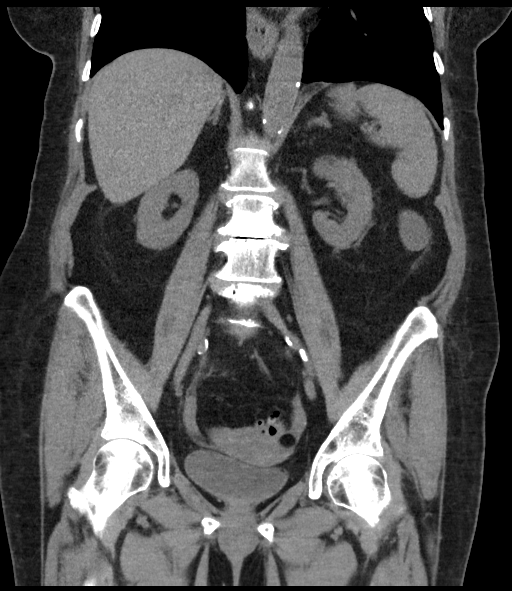
[im 56/101  soft-tissue]
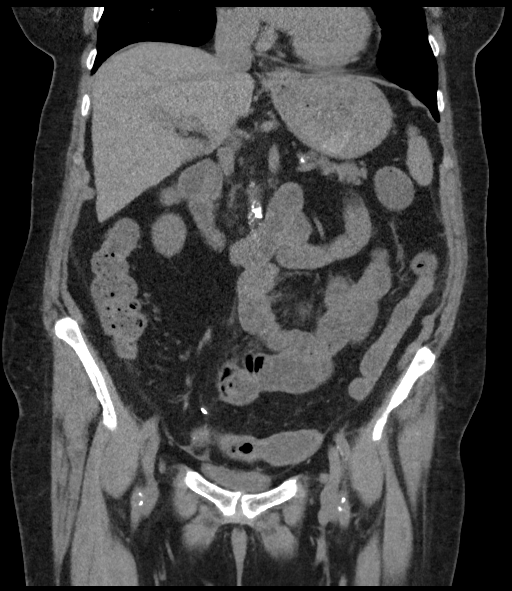

[16 of 46 positions shown; findings below may reference images not displayed]

FINDINGS: Lower chest: Lung bases demonstrate no acute consolidation or
effusion. Minimal fibrosis in the medial right base. Small hiatal
hernia

Hepatobiliary: No focal liver abnormality is seen. No gallstones,
gallbladder wall thickening, or biliary dilatation.

Pancreas: Unremarkable. No pancreatic ductal dilatation or
surrounding inflammatory changes.

Spleen: Normal in size without focal abnormality.

Adrenals/Urinary Tract: Adrenal glands are unremarkable. Kidneys are
normal, without renal calculi, focal lesion, or hydronephrosis.
Bladder is unremarkable.

Stomach/Bowel: Stomach moderately distended with fluid. Diffuse
fluid-filled small and large bowel without definitive transition
point. Diffuse fluid in the colon. No colon wall thickening.
Postsurgical changes at the cecum and ileocecal region. Dilated
ascending colon is chronic. Diverticular disease of the sigmoid
colon without acute wall thickening

Vascular/Lymphatic: Moderate aortic atherosclerosis. No aneurysm. No
suspicious nodes

Reproductive: Uterus and bilateral adnexa are unremarkable.

Other: Negative for free air.  Trace fluid in the pelvis.

Musculoskeletal: Grade 1 anterolisthesis L4 on L5. No acute osseous
abnormality
IMPRESSION: 1. Diffuse fluid within the small and large bowel suggestive of
ileus or enteritis. No convincing evidence for obstruction at this
time. Negative for acute bowel wall thickening.
2. Mild sigmoid colon diverticular disease without acute
inflammatory process
3. Trace free fluid in the pelvis.

## 2022-03-22 ENCOUNTER — Encounter: Payer: Self-pay | Admitting: Family Medicine

## 2022-03-22 DIAGNOSIS — C4492 Squamous cell carcinoma of skin, unspecified: Secondary | ICD-10-CM

## 2022-03-22 NOTE — Progress Notes (Signed)
[place] at [facility] [address] [place], [place] [address] [phone]  Date:  03/28/2022   Name:  Wendy Hebert   DOB:  [birth year]   MRN:  [id number]  PCP:  Darreld Mclean, MD    Chief Complaint: Suture / Staple Removal (Posterior leg lesion)   History of Present Illness:  Wendy Hebert is a 88 y.o. very pleasant female patient who presents with the following:  Patient seen today for suture removal I removed a skin lesion from her posterior right calf on 7/17  Pt notes the area seems to be doing well-healing, no pain or discharge.  She has not yet heard from the skin surgery center back appointment, I will check with them  She also might have a few skin lesions frozen on her face and neck  Feeling well, no other concerns today  She is taking Eliquis for persistent A-fib, seen by cardiology just recently  Patient Active Problem List   Diagnosis Date Noted   Osteopenia 08/25/2019   Malignant neoplasm of upper-inner quadrant of right breast in female, estrogen receptor positive ([place]) 02/16/2019   Atrial fibrillation ([place]) 10/01/2018   Gait difficulty 29/52/8413   Lichen sclerosus 24/40/1027    Past Medical History:  Diagnosis Date   Arthritis    Cancer ([place]) 2017   1.6 lumpectomy   Lichen sclerosus     Past Surgical History:  Procedure Laterality Date   APPENDECTOMY     BOWEL RESECTION     BREAST LUMPECTOMY  2017   CARDIOVERSION N/A 11/10/2018   Procedure: CARDIOVERSION;  Surgeon: [name], MD;  Location: [facility];  Service: Cardiovascular;  Laterality: N/A;   CESAREAN SECTION     HERNIA REPAIR     TONSILLECTOMY      Social History   Tobacco Use   Smoking status: Former   Smokeless tobacco: Never   Tobacco comments:    30+ years  Vaping Use   Vaping Use: Never used  Substance Use Topics   Alcohol use: Not Currently    Comment: occasionally   Drug use: Never    Family  History  Problem Relation Age of Onset   Cancer Father        Lung   Cancer Sister     No Known Allergies  Medication list has been reviewed and updated.  Current Outpatient Medications on File Prior to Visit  Medication Sig Dispense Refill   ALPRAZolam (XANAX) 0.5 MG tablet TAKE 1/2 TO 1 TABLET BY  MOUTH TWICE DAILY AS NEEDED FOR ANXIETY 60 tablet 1   Calcium Carb-Cholecalciferol (CALCIUM 600 + D PO) Take 1 tablet by mouth every evening.     clobetasol cream (TEMOVATE) 2.53 % Apply 1 application topically daily. 90 g 0   diltiazem (CARDIZEM CD) 180 MG 24 hr capsule Take 180 mg by mouth daily.     ELIQUIS 5 MG TABS tablet TAKE 1 TABLET BY MOUTH  TWICE DAILY 180 tablet 3   Melatonin 1 MG CAPS Take 2 capsules (2 mg total) by mouth at bedtime. 180 capsule 3   metoprolol tartrate (LOPRESSOR) 25 MG tablet TAKE 1 AND 1/2 TABLETS BY  MOUTH IN THE MORNING AND 1  TABLET IN THE EVENING 225 tablet 3   Multiple Vitamin (MULTIVITAMIN WITH MINERALS) TABS tablet Take 1 tablet by mouth every evening.     Multiple Vitamins-Minerals (PRESERVISION AREDS 2) CAPS Take 2 capsules by mouth every  evening.     traZODone (DESYREL) 50 MG tablet Take 1-2 tablets (50-100 mg total) by mouth at bedtime as needed for sleep. 180 tablet 3   vitamin C (ASCORBIC ACID) 500 MG tablet Take 500 mg by mouth daily.     No current facility-administered medications on file prior to visit.    Review of Systems:  As per HPI- otherwise negative.   Physical Examination: Vitals:   03/28/22 1012  BP: 132/80  Pulse: 85  Resp: 18  Temp: 97.8 F (36.6 C)  SpO2: 91%   Vitals:   03/28/22 1012  Weight: 139 lb (63 kg)  Height: '5\' 1"'$  (1.549 m)   Body mass index is 26.26 kg/m. Ideal Body Weight: Weight in (lb) to have BMI = 25: 132  GEN: No acute distress; alert,appropriate. PULM: Breathing comfortably in no respiratory distress PSYCH: Normally interactive.  Surgical resection area on posterior right calf appears to be  healing normally.  No sign of infection.  Remove #3 sutures, placed Steri-Strips to support wound as it continues healing Patient tolerated well with no complications  Verbal consent obtained.  Liquid nitrogen cryotherapy to approximately 8 skin lesions- benign seborrheic keratoses-over face and neck Patient tolerated well with no complications  Assessment and Plan: Visit for suture removal  Seborrheic keratoses  Patient seen today for suture removal, cryotherapy as above I called the skin surgery center and they reported not having her referral, it looks like we sent it on July 21.  I had a direct fax number for referrals coordinator and will touch base with my referrals coordinator to resend this referral  In the meantime, I asked patient to let me know if any questions or concerns about how the wound is healing  Fax to [facility] attn Kim   Signed Lamar Blinks, MD

## 2022-03-26 ENCOUNTER — Encounter: Payer: Self-pay | Admitting: Family Medicine

## 2022-03-26 DIAGNOSIS — I4891 Unspecified atrial fibrillation: Secondary | ICD-10-CM | POA: Diagnosis not present

## 2022-03-26 DIAGNOSIS — K5521 Angiodysplasia of colon with hemorrhage: Secondary | ICD-10-CM | POA: Diagnosis not present

## 2022-03-26 DIAGNOSIS — R0600 Dyspnea, unspecified: Secondary | ICD-10-CM | POA: Diagnosis not present

## 2022-03-28 ENCOUNTER — Ambulatory Visit (INDEPENDENT_AMBULATORY_CARE_PROVIDER_SITE_OTHER): Payer: Medicare Other | Admitting: Family Medicine

## 2022-03-28 ENCOUNTER — Encounter: Payer: Self-pay | Admitting: Family Medicine

## 2022-03-28 VITALS — BP 132/80 | HR 85 | Temp 97.8°F | Resp 18 | Ht 61.0 in | Wt 139.0 lb

## 2022-03-28 DIAGNOSIS — L821 Other seborrheic keratosis: Secondary | ICD-10-CM | POA: Diagnosis not present

## 2022-03-28 DIAGNOSIS — C4492 Squamous cell carcinoma of skin, unspecified: Secondary | ICD-10-CM

## 2022-03-28 DIAGNOSIS — Z4802 Encounter for removal of sutures: Secondary | ICD-10-CM

## 2022-04-04 ENCOUNTER — Encounter: Payer: Self-pay | Admitting: Family Medicine

## 2022-04-05 ENCOUNTER — Encounter: Payer: Self-pay | Admitting: Family Medicine

## 2022-04-05 NOTE — Telephone Encounter (Signed)
Called skin surgery center- nothing sooner is available Called [facility]- Dr Barry Dienes can help with this They can see her tomorrow 8/4!  Called pt and gave her this info, she can make the appt

## 2022-04-06 ENCOUNTER — Other Ambulatory Visit: Payer: Self-pay | Admitting: General Surgery

## 2022-04-06 DIAGNOSIS — C44722 Squamous cell carcinoma of skin of right lower limb, including hip: Secondary | ICD-10-CM | POA: Diagnosis not present

## 2022-04-06 DIAGNOSIS — I4891 Unspecified atrial fibrillation: Secondary | ICD-10-CM | POA: Diagnosis not present

## 2022-04-06 DIAGNOSIS — Z7901 Long term (current) use of anticoagulants: Secondary | ICD-10-CM | POA: Diagnosis not present

## 2022-04-12 ENCOUNTER — Encounter: Payer: Self-pay | Admitting: Family Medicine

## 2022-04-16 ENCOUNTER — Encounter: Payer: Self-pay | Admitting: Family Medicine

## 2022-04-23 ENCOUNTER — Other Ambulatory Visit: Payer: Self-pay

## 2022-04-23 ENCOUNTER — Encounter (HOSPITAL_COMMUNITY): Payer: Self-pay | Admitting: General Surgery

## 2022-04-23 NOTE — Progress Notes (Signed)
[facility] CALL  Patient was given pre-op instructions over the phone. The opportunity was given for the patient to ask questions. No further questions asked. Patient verbalized understanding of instructions given.   PCP - Dr. Edilia Bo Cardiologist - Dr. Elonda Husky  Chest x-ray -  EKG - DOS Stress Test -  ECHO - 10-13-2018 Cardiac Cath -   Sleep Study -  CPAP -   Blood Thinner Instructions: Last Dose Eliquis Saturday 04/21/22 Aspirin Instructions:  ERAS Protcol - Clears until 8:30 PRE-SURGERY Ensure or G2-   COVID TEST- n/a   Anesthesia review: no  Patient denies  fever, cough and chest pain over the phone call. Endorses she has occasional SOB but her PCP is aware of it.   Pt anxious during phone call and "very unhappy" with this surgery. Multiple questions regarding surgery, how deep, etc.  Encouraged patient to call surgeon's office, also informed she would see MD prior to surgery tomorrow.   All instructions explained to the patient, with a verbal understanding of the material. Patient agrees to go over the instructions while at home for a better understanding.

## 2022-04-24 ENCOUNTER — Ambulatory Visit (HOSPITAL_BASED_OUTPATIENT_CLINIC_OR_DEPARTMENT_OTHER): Payer: Medicare Other | Admitting: Anesthesiology

## 2022-04-24 ENCOUNTER — Ambulatory Visit (HOSPITAL_COMMUNITY)
Admission: RE | Admit: 2022-04-24 | Discharge: 2022-04-24 | Disposition: A | Discharge status: Home / Self Care | Payer: Medicare Other | Attending: General Surgery | Admitting: General Surgery

## 2022-04-24 ENCOUNTER — Other Ambulatory Visit: Payer: Self-pay

## 2022-04-24 ENCOUNTER — Ambulatory Visit (HOSPITAL_COMMUNITY): Payer: Medicare Other | Admitting: Anesthesiology

## 2022-04-24 ENCOUNTER — Encounter (HOSPITAL_COMMUNITY)
Admission: RE | Disposition: A | Discharge status: Home / Self Care | Payer: Self-pay | Source: Home / Self Care | Attending: General Surgery

## 2022-04-24 DIAGNOSIS — I4891 Unspecified atrial fibrillation: Secondary | ICD-10-CM | POA: Insufficient documentation

## 2022-04-24 DIAGNOSIS — I10 Essential (primary) hypertension: Secondary | ICD-10-CM | POA: Diagnosis not present

## 2022-04-24 DIAGNOSIS — Z87891 Personal history of nicotine dependence: Secondary | ICD-10-CM | POA: Diagnosis not present

## 2022-04-24 DIAGNOSIS — C44722 Squamous cell carcinoma of skin of right lower limb, including hip: Secondary | ICD-10-CM

## 2022-04-24 DIAGNOSIS — Z803 Family history of malignant neoplasm of breast: Secondary | ICD-10-CM | POA: Insufficient documentation

## 2022-04-24 DIAGNOSIS — Z809 Family history of malignant neoplasm, unspecified: Secondary | ICD-10-CM | POA: Diagnosis not present

## 2022-04-24 DIAGNOSIS — Z7901 Long term (current) use of anticoagulants: Secondary | ICD-10-CM | POA: Diagnosis not present

## 2022-04-24 DIAGNOSIS — M1990 Unspecified osteoarthritis, unspecified site: Secondary | ICD-10-CM | POA: Diagnosis not present

## 2022-04-24 HISTORY — DX: Depression, unspecified: F32.A

## 2022-04-24 HISTORY — DX: Unspecified atrial fibrillation: I48.91

## 2022-04-24 HISTORY — DX: Essential (primary) hypertension: I10

## 2022-04-24 HISTORY — PX: LESION REMOVAL: SHX5196

## 2022-04-24 LAB — BASIC METABOLIC PANEL
Anion gap: 8 (ref 5–15)
BUN: 17 mg/dL (ref 8–23)
CO2: 27 mmol/L (ref 22–32)
Calcium: 9.3 mg/dL (ref 8.9–10.3)
Chloride: 105 mmol/L (ref 98–111)
Creatinine, Ser: 1.04 mg/dL — ABNORMAL HIGH (ref 0.44–1.00)
GFR, Estimated: 52 mL/min — ABNORMAL LOW (ref >60)
Glucose, Bld: 99 mg/dL (ref 70–99)
Potassium: 5 mmol/L (ref 3.5–5.1)
Sodium: 140 mmol/L (ref 135–145)

## 2022-04-24 LAB — CBC
HCT: 44 % (ref 36.0–46.0)
Hemoglobin: 14.4 g/dL (ref 12.0–15.0)
MCH: 32.2 pg (ref 26.0–34.0)
MCHC: 32.7 g/dL (ref 30.0–36.0)
MCV: 98.4 fL (ref 80.0–100.0)
Platelets: 289 10*3/uL (ref 150–400)
RBC: 4.47 MIL/uL (ref 3.87–5.11)
RDW: 13.8 % (ref 11.5–15.5)
WBC: 9.2 10*3/uL (ref 4.0–10.5)
nRBC: 0 % (ref 0.0–0.2)

## 2022-04-24 SURGERY — WIDE EXCISION, LESION, UPPER EXTREMITY
Anesthesia: Monitor Anesthesia Care | Site: Leg Lower | Laterality: Right

## 2022-04-24 MED ORDER — LIDOCAINE HCL (PF) 1 % IJ SOLN
INTRAMUSCULAR | Status: AC
  Filled 2022-04-24: qty 30

## 2022-04-24 MED ORDER — LACTATED RINGERS IV SOLN: INTRAVENOUS | Status: DC | PRN

## 2022-04-24 MED ORDER — CHLORHEXIDINE GLUCONATE 0.12 % MT SOLN: 15.0000 mL | Freq: Once | OROMUCOSAL | Status: AC

## 2022-04-24 MED ORDER — CEFAZOLIN SODIUM-DEXTROSE 2-4 GM/100ML-% IV SOLN
2.0000 g | INTRAVENOUS | Status: AC
  Administered 2022-04-24: 2 g via INTRAVENOUS
  Filled 2022-04-24: qty 100

## 2022-04-24 MED ORDER — ONDANSETRON HCL 4 MG/2ML IJ SOLN
4.0000 mg | Freq: Once | INTRAMUSCULAR | Status: DC | PRN
Start: 2022-04-24 — End: 2022-04-24

## 2022-04-24 MED ORDER — FENTANYL CITRATE (PF) 250 MCG/5ML IJ SOLN
INTRAMUSCULAR | Status: AC
  Filled 2022-04-24: qty 5

## 2022-04-24 MED ORDER — PHENYLEPHRINE HCL (PRESSORS) 10 MG/ML IV SOLN
INTRAVENOUS | Status: DC | PRN
  Administered 2022-04-24: 80 ug via INTRAVENOUS

## 2022-04-24 MED ORDER — CHLORHEXIDINE GLUCONATE CLOTH 2 % EX PADS: 6.0000 | MEDICATED_PAD | Freq: Once | CUTANEOUS | Status: DC

## 2022-04-24 MED ORDER — ACETAMINOPHEN 500 MG PO TABS: 1000.0000 mg | ORAL_TABLET | ORAL | Status: DC

## 2022-04-24 MED ORDER — ONDANSETRON HCL 4 MG/2ML IJ SOLN
INTRAMUSCULAR | Status: AC
  Filled 2022-04-24: qty 2

## 2022-04-24 MED ORDER — 0.9 % SODIUM CHLORIDE (POUR BTL) OPTIME
TOPICAL | Status: DC | PRN
  Administered 2022-04-24: 1000 mL

## 2022-04-24 MED ORDER — LACTATED RINGERS IV SOLN: INTRAVENOUS | Status: DC

## 2022-04-24 MED ORDER — BUPIVACAINE-EPINEPHRINE (PF) 0.25% -1:200000 IJ SOLN
INTRAMUSCULAR | Status: AC
  Filled 2022-04-24: qty 30

## 2022-04-24 MED ORDER — ORAL CARE MOUTH RINSE
15.0000 mL | Freq: Once | OROMUCOSAL | Status: AC
Start: 2022-04-24 — End: 2022-04-24

## 2022-04-24 MED ORDER — LIDOCAINE 2% (20 MG/ML) 5 ML SYRINGE
INTRAMUSCULAR | Status: AC
  Filled 2022-04-24: qty 5

## 2022-04-24 MED ORDER — ACETAMINOPHEN 500 MG PO TABS
1000.0000 mg | ORAL_TABLET | Freq: Once | ORAL | Status: AC
  Administered 2022-04-24: 1000 mg via ORAL
  Filled 2022-04-24: qty 2

## 2022-04-24 MED ORDER — LIDOCAINE HCL 1 % IJ SOLN
INTRAMUSCULAR | Status: DC | PRN
  Administered 2022-04-24: 27 mL via INTRAMUSCULAR

## 2022-04-24 MED ORDER — PROPOFOL 500 MG/50ML IV EMUL
INTRAVENOUS | Status: DC | PRN
  Administered 2022-04-24: 150 ug/kg/min via INTRAVENOUS

## 2022-04-24 MED ORDER — FENTANYL CITRATE (PF) 100 MCG/2ML IJ SOLN: 25.0000 ug | INTRAMUSCULAR | Status: DC | PRN

## 2022-04-24 MED ORDER — DEXAMETHASONE SODIUM PHOSPHATE 10 MG/ML IJ SOLN
INTRAMUSCULAR | Status: AC
  Filled 2022-04-24: qty 1

## 2022-04-24 MED ORDER — DEXAMETHASONE SODIUM PHOSPHATE 10 MG/ML IJ SOLN
INTRAMUSCULAR | Status: DC | PRN
  Administered 2022-04-24: 10 mg via INTRAVENOUS

## 2022-04-24 MED ORDER — CHLORHEXIDINE GLUCONATE 0.12 % MT SOLN
OROMUCOSAL | Status: AC
  Administered 2022-04-24: 15 mL via OROMUCOSAL
  Filled 2022-04-24: qty 15

## 2022-04-24 MED ORDER — METOPROLOL TARTRATE 25 MG PO TABS
25.0000 mg | ORAL_TABLET | Freq: Once | ORAL | Status: AC
  Filled 2022-04-24: qty 1

## 2022-04-24 MED ORDER — ONDANSETRON HCL 4 MG/2ML IJ SOLN
INTRAMUSCULAR | Status: DC | PRN
  Administered 2022-04-24: 4 mg via INTRAVENOUS

## 2022-04-24 MED ORDER — LIDOCAINE 2% (20 MG/ML) 5 ML SYRINGE
INTRAMUSCULAR | Status: DC | PRN
  Administered 2022-04-24: 100 mg via INTRAVENOUS

## 2022-04-24 MED ORDER — METOPROLOL TARTRATE 12.5 MG HALF TABLET
ORAL_TABLET | ORAL | Status: AC
  Administered 2022-04-24: 25 mg via ORAL
  Filled 2022-04-24: qty 2

## 2022-04-24 MED ORDER — PROPOFOL 1000 MG/100ML IV EMUL
INTRAVENOUS | Status: AC
  Filled 2022-04-24: qty 100

## 2022-04-24 SURGICAL SUPPLY — 46 items
BAG COUNTER SPONGE SURGICOUNT (BAG) ×1 IMPLANT
BENZOIN TINCTURE AMPULE (MISCELLANEOUS) IMPLANT
BENZOIN TINCTURE PRP APPL 2/3 (GAUZE/BANDAGES/DRESSINGS) ×1 IMPLANT
BNDG COHESIVE 3X5 TAN ST LF (GAUZE/BANDAGES/DRESSINGS) IMPLANT
BNDG COHESIVE 6X5 TAN ST LF (GAUZE/BANDAGES/DRESSINGS) IMPLANT
CANISTER SUCT 3000ML PPV (MISCELLANEOUS) ×1 IMPLANT
CHLORAPREP W/TINT 26 (MISCELLANEOUS) ×1 IMPLANT
CLOSURE STERI STRIP 1/2 X4 (GAUZE/BANDAGES/DRESSINGS) IMPLANT
COVER SURGICAL LIGHT HANDLE (MISCELLANEOUS) ×1 IMPLANT
DERMABOND ADVANCED (GAUZE/BANDAGES/DRESSINGS)
DERMABOND ADVANCED .7 DNX12 (GAUZE/BANDAGES/DRESSINGS) IMPLANT
DRAPE EXTREMITY T 121X128X90 (DISPOSABLE) IMPLANT
DRAPE HALF SHEET 40X57 (DRAPES) IMPLANT
DRAPE LAPAROSCOPIC ABDOMINAL (DRAPES) IMPLANT
DRAPE LAPAROTOMY 100X72 PEDS (DRAPES) IMPLANT
DRSG TEGADERM 4X4.75 (GAUZE/BANDAGES/DRESSINGS) IMPLANT
ELECT REM PT RETURN 9FT ADLT (ELECTROSURGICAL) ×1
ELECTRODE REM PT RTRN 9FT ADLT (ELECTROSURGICAL) ×1 IMPLANT
GAUZE 4X4 16PLY ~~LOC~~+RFID DBL (SPONGE) ×1 IMPLANT
GAUZE SPONGE 4X4 12PLY STRL (GAUZE/BANDAGES/DRESSINGS) IMPLANT
GLOVE BIO SURGEON STRL SZ 6 (GLOVE) ×1 IMPLANT
GLOVE INDICATOR 6.5 STRL GRN (GLOVE) ×1 IMPLANT
GOWN STRL REUS W/ TWL LRG LVL3 (GOWN DISPOSABLE) ×1 IMPLANT
GOWN STRL REUS W/TWL 2XL LVL3 (GOWN DISPOSABLE) ×1 IMPLANT
GOWN STRL REUS W/TWL LRG LVL3 (GOWN DISPOSABLE) ×1
KIT BASIN OR (CUSTOM PROCEDURE TRAY) ×1 IMPLANT
KIT TURNOVER KIT B (KITS) ×1 IMPLANT
NDL HYPO 25GX1X1/2 BEV (NEEDLE) ×1 IMPLANT
NEEDLE HYPO 25GX1X1/2 BEV (NEEDLE) ×1 IMPLANT
NS IRRIG 1000ML POUR BTL (IV SOLUTION) ×1 IMPLANT
PACK GENERAL/GYN (CUSTOM PROCEDURE TRAY) ×1 IMPLANT
PAD ARMBOARD 7.5X6 YLW CONV (MISCELLANEOUS) ×2 IMPLANT
PENCIL SMOKE EVACUATOR (MISCELLANEOUS) ×1 IMPLANT
POWDER MYRIAD MORCELLS 1000MG (Miscellaneous) IMPLANT
SPECIMEN JAR SMALL (MISCELLANEOUS) ×1 IMPLANT
SPONGE T-LAP 4X18 ~~LOC~~+RFID (SPONGE) IMPLANT
STOCKINETTE IMPERVIOUS 9X36 MD (GAUZE/BANDAGES/DRESSINGS) IMPLANT
STRIP CLOSURE SKIN 1/2X4 (GAUZE/BANDAGES/DRESSINGS) IMPLANT
SUT ETHILON 2 0 PSLX (SUTURE) IMPLANT
SUT MON AB 4-0 PC3 18 (SUTURE) ×1 IMPLANT
SUT SILK 2 0 PERMA HAND 18 BK (SUTURE) IMPLANT
SUT VIC AB 3-0 SH 27 (SUTURE) ×1
SUT VIC AB 3-0 SH 27X BRD (SUTURE) ×1 IMPLANT
SYR CONTROL 10ML LL (SYRINGE) ×1 IMPLANT
TOWEL GREEN STERILE (TOWEL DISPOSABLE) ×1 IMPLANT
TOWEL GREEN STERILE FF (TOWEL DISPOSABLE) ×1 IMPLANT

## 2022-04-24 NOTE — Anesthesia Preprocedure Evaluation (Addendum)
Anesthesia Evaluation  Patient identified by MRN, date of birth, ID band Patient awake    Reviewed: Allergy & Precautions, NPO status , Patient's Chart, lab work & pertinent test results, reviewed documented beta blocker date and time   Airway Mallampati: II  TM Distance: >3 FB Neck ROM: Full    Dental  (+) Dental Advisory Given, Missing   Pulmonary former smoker,    Pulmonary exam normal breath sounds clear to auscultation       Cardiovascular hypertension, Pt. on medications and Pt. on home beta blockers + dysrhythmias Atrial Fibrillation  Rhythm:Irregular Rate:Abnormal     Neuro/Psych PSYCHIATRIC DISORDERS Depression negative neurological ROS     GI/Hepatic negative GI ROS, Neg liver ROS,   Endo/Other  negative endocrine ROS  Renal/GU negative Renal ROS     Musculoskeletal  (+) Arthritis , RIGHT CALF SQUAMOUS CELL CANCER   Abdominal   Peds  Hematology negative hematology ROS (+)   Anesthesia Other Findings Day of surgery medications reviewed with the patient.  Reproductive/Obstetrics                            Anesthesia Physical Anesthesia Plan  ASA: 3  Anesthesia Plan: MAC   Post-op Pain Management: Tylenol PO (pre-op)* and Minimal or no pain anticipated   Induction: Intravenous  PONV Risk Score and Plan: 2 and TIVA and Ondansetron  Airway Management Planned: Natural Airway and Nasal Cannula  Additional Equipment:   Intra-op Plan:   Post-operative Plan:   Informed Consent: I have reviewed the patients History and Physical, chart, labs and discussed the procedure including the risks, benefits and alternatives for the proposed anesthesia with the patient or authorized representative who has indicated his/her understanding and acceptance.     Dental advisory given  Plan Discussed with: CRNA  Anesthesia Plan Comments:         Anesthesia Quick Evaluation

## 2022-04-24 NOTE — Anesthesia Procedure Notes (Signed)
Date/Time: 04/24/2022 1:11 PM  Performed by: Claris Che, CRNAPre-anesthesia Checklist: Patient identified, Emergency Drugs available, Suction available, Patient being monitored and Timeout performed Patient Re-evaluated:Patient Re-evaluated prior to induction Oxygen Delivery Method: Simple face mask Dental Injury: Teeth and Oropharynx as per pre-operative assessment

## 2022-04-24 NOTE — Transfer of Care (Signed)
Immediate Anesthesia Transfer of Care Note  Patient: Wendy Hebert  Procedure(s) Performed: WIDE LOCAL EXCISION RIGHT CALF SQUAMOUS CELL CANCER (Right: Leg Lower)  Patient Location: PACU  Anesthesia Type:MAC  Level of Consciousness: awake, alert , oriented and patient cooperative  Airway & Oxygen Therapy: Patient Spontanous Breathing  Post-op Assessment: Report given to RN, Post -op Vital signs reviewed and stable and Patient moving all extremities X 4  Post vital signs: Reviewed and stable  Last Vitals:  Vitals Value Taken Time  BP 76/23 04/24/22 1323  Temp    Pulse 60 04/24/22 1327  Resp 19 04/24/22 1327  SpO2 100 % 04/24/22 1327  Vitals shown include unvalidated device data.  Last Pain:  Vitals:   04/24/22 1005  TempSrc:   PainSc: 0-No pain         Complications: No notable events documented.

## 2022-04-24 NOTE — Op Note (Signed)
PRE-OPERATIVE DIAGNOSIS: squamous cell carcinoma right calf  POST-OPERATIVE DIAGNOSIS:  Same  PROCEDURE:  Procedure(s): Wide local excision right calf squamous cell carcinoma and advancement flap closure for defect 5x3 cm. Placement of myriad morcells  SURGEON:  Surgeon(s): Stark Klein, MD  ANESTHESIA:   local and IV sedation  DRAINS: none   LOCAL MEDICATIONS USED:  BUPIVICAINE  and LIDOCAINE   SPECIMEN:  Source of Specimen:  right calf squamous cell carcinoma   DISPOSITION OF SPECIMEN:  PATHOLOGY  COUNTS:  YES  DICTATION: .Dragon Dictation  PLAN OF CARE: Discharge to home after PACU  PATIENT DISPOSITION:  PACU - hemodynamically stable.  FINDINGS:  no gross residual disease.    EBL: min  PROCEDURE:  Patient was identified in the holding area and taken the operating room where she was placed supine on operating room table.  A sequential compression device was placed on the left lower extremity.  MAC anesthesia was induced.  The right leg was placed into a frog-leg position.  This was then prepped and draped in sterile fashion.  A timeout was performed according to the surgical safety checklist.  When all was correct, we continued.  An ellipse was drawn around the squamous cell carcinoma.  This site was confirmed with the patient preoperatively prior to any anesthetic.  The skin was infiltrated with local anesthetic.  This was done under the cancerous skin and is well as the surrounding tissue.  The #10 blade was used to make the incision.  The cautery was used then to take the incision down to the fascia.  The lesion was marked in situ prior to complete removal from the patient's body.  This was marked short superior long lateral.  The cautery was then used to take the specimen the rest the way off of the fascia.  This was passed off the table.  The skin around the incision was then infiltrated with additional local anesthetic.  Skin hooks were used to elevate the skin edges and  the cautery was used to create advancement flaps.  The cavity was irrigated and inspected for hemostasis, which was achieved with cautery.  The myriad morcells and saline were used to make a paste to promote wound healing.  This this was placed into the wound cavity.  4 vertical 2-0 nylon sutures were placed.  The cranial and caudal ones were tied down loosely.  A 4-0 Monocryl was then used to run the skin primarily in subcuticular fashion.  Due to the thinness of the skin, the knot at both ends of the Monocryl was placed on top of the skin.  The medial 2 nylons were then tied down loosely.  Wound was then cleaned, dried, and dressed with benzoin, Steri-Strips, gauze, Tegaderm, and a Coban.  The patient was allowed to emerge from anesthesia and was taken to the PACU in stable condition.  Needle, sponge, and instrument counts were correct x2.

## 2022-04-24 NOTE — Discharge Instructions (Addendum)
[place] Office Phone Number [phone]   POST OP INSTRUCTIONS  Always review your discharge instruction sheet given to you by the facility where your surgery was performed.  IF YOU HAVE DISABILITY OR FAMILY LEAVE FORMS, YOU MUST BRING THEM TO THE OFFICE FOR PROCESSING.  DO NOT GIVE THEM TO YOUR DOCTOR.  Take 2 tylenol (acetominophen) three times a day for 3 days.  If you still have pain, add ibuprofen with food in between if able to take this (if you have kidney issues or stomach issues, do not take ibuprofen).  If both of those are not enough, add the narcotic pain pill.  If you find you are needing a lot of this overnight after surgery, call the next morning for a refill.   Take your usually prescribed medications unless otherwise directed If you need a refill on your pain medication, please contact your pharmacy.  They will contact our office to request authorization.  Prescriptions will not be filled after 5pm or on week-ends. You should eat very light the first 24 hours after surgery, such as soup, crackers, pudding, etc.  Resume your normal diet the day after surgery It is common to experience some constipation if taking pain medication after surgery.  Increasing fluid intake and taking a stool softener will usually help or prevent this problem from occurring.  A mild laxative (Milk of Magnesia or Miralax) should be taken according to package directions if there are no bowel movements after 48 hours. Remove the Coban (brown dressing that wraps around the calf) on Thursday.  Keep the plastic/gauze dressing on if possible.  You may shower in 48 hours.  Remove the plastic dressing/gauze on Saturday.  At that point, you can leave it open OR cover it again if the suture tails sticking out are bothersome.  The surgical strips will usually peel off in 2-3 weeks.   ACTIVITIES:  No strenuous activity or heavy lifting for 2 weeks.   You may drive when you no longer are taking  prescription pain medication, you can comfortably wear a seatbelt, and you can safely maneuver your car and apply brakes. RETURN TO WORK:  __________n/a_______________ [name] should see your doctor in the office for a follow-up appointment approximately three-four weeks after your surgery.    WHEN TO CALL YOUR DOCTOR: Fever over 101.0 Nausea and/or vomiting. Extreme swelling or bruising. Continued bleeding from incision. Increased pain, redness, or drainage from the incision.  The clinic staff is available to answer your questions during regular business hours.  Please don't hesitate to call and ask to speak to one of the nurses for clinical concerns.  If you have a medical emergency, go to the nearest emergency room or call 911.  A surgeon from [facility] is always on call at the hospital.  For further questions, please visit [email or web address]

## 2022-04-24 NOTE — Anesthesia Postprocedure Evaluation (Signed)
Anesthesia Post Note  Patient: Wendy Hebert  Procedure(s) Performed: WIDE LOCAL EXCISION RIGHT CALF SQUAMOUS CELL CANCER (Right: Leg Lower)     Patient location during evaluation: PACU Anesthesia Type: MAC Level of consciousness: awake and alert Pain management: pain level controlled Vital Signs Assessment: post-procedure vital signs reviewed and stable Respiratory status: spontaneous breathing, nonlabored ventilation and respiratory function stable Cardiovascular status: blood pressure returned to baseline Postop Assessment: no apparent nausea or vomiting Anesthetic complications: no   No notable events documented.  Last Vitals:  Vitals:   04/24/22 1325 04/24/22 1340  BP: 106/70 (!) 126/90  Pulse: 63 66  Resp: 20 16  Temp: (!) 36.2 C 36.4 C  SpO2: 98% 99%    Last Pain:  Vitals:   04/24/22 1340  TempSrc:   PainSc: 0-No pain                 Marthenia Rolling

## 2022-04-24 NOTE — H&P (Signed)
REFERRING PHYSICIAN: Silvestre Mesi Patient Care Team: Copland, Bethel Born, MD as PCP - General (Family Medicine) Jaci Lazier, MD (Cardiovascular Disease) Georgianne Fick, MD as Consulting Provider (Surgical Oncology)  PROVIDER: Georgianne Fick, MD  MRN: [id number] DOB: 1933/06/12 DATE OF ENCOUNTER: 04/06/2022 Subjective   Chief Complaint: New Patient (New cancer sq cell ca )   History of Present Illness: Wendy Hebert is a 88 y.o. female who is seen today as an office consultation for evaluation of New Patient (New cancer sq cell ca )  Patient presented with a lesion on the right calf in late May 2023. It started to get larger and she brought to the attention of her primary care physician. Dr. Edilia Bo did an excisional biopsy July 17 which showed squamous cell carcinoma that was well differentiated. The deep margin was positive. The patient reports that the biopsy went well and that she has a scab that is still healing. She does not have a dermatologist and has no previous history of cancer other than this. She does have a sister that had breast cancer as well as skin cancer.  She gets around on her own with a cane. She reports a hernia in the left groin.   Review of Systems: A complete review of systems was obtained from the patient. I have reviewed this information and discussed as appropriate with the patient. See HPI as well for other ROS.  Review of Systems  Respiratory: Positive for shortness of breath.  Gastrointestinal: Positive for abdominal pain.  Neurological: Positive for weakness.  Endo/Heme/Allergies: Bruises/bleeds easily.  All other systems reviewed and are negative.   Medical History: Past Medical History:  Diagnosis Date  Anemia  Arrhythmia  History of cancer  Hypertension   Patient Active Problem List  Diagnosis  Squamous cell carcinoma of skin of right calf  Atrial fibrillation (CMS-HCC)  On anticoagulant therapy    Past Surgical History:  Procedure Laterality Date  AJCC BREAST CANCER STAGE I: T1MIC, T1A OR T1B (TUMOR SIZE < 1 CM) DOCUMENTED (ONC) 2017  HERNIA REPAIR    No Known Allergies  Current Outpatient Medications on File Prior to Visit  Medication Sig Dispense Refill  apixaban (ELIQUIS) 5 mg tablet Take 1 tablet by mouth 2 (two) times daily  losartan (COZAAR) 25 MG tablet Take by mouth  metoprolol tartrate (LOPRESSOR) 25 MG tablet TAKE 1 AND 1/2 TABLETS BY MOUTH IN THE MORNING AND 1 TABLET IN THE EVENING   No current facility-administered medications on file prior to visit.   Family History  Problem Relation Age of Onset  Skin cancer Sister  Breast cancer Sister    Social History   Tobacco Use  Smoking Status Former  Types: Cigarettes  Smokeless Tobacco Never    Social History   Socioeconomic History  Marital status: Single  Tobacco Use  Smoking status: Former  Types: Cigarettes  Smokeless tobacco: Never  Substance and Sexual Activity  Alcohol use: Never  Drug use: Never   Objective:   Vitals:  04/06/22 1139  BP: 130/74  Pulse: 68  Temp: 36.2 C (97.2 F)  SpO2: 95%  Weight: 65.1 kg (143 lb 9.6 oz)  Height: 154.9 cm ('5\' 1"'$ )   Body mass index is 27.13 kg/m.  Head: Normocephalic and atraumatic.  Mouth/Throat: Oropharynx is clear and moist. No oropharyngeal exudate.  Eyes: Conjunctivae are normal. Pupils are equal, round, and reactive to light. No scleral icterus.  Neck: Normal range of motion. Neck supple. No tracheal deviation present.  No thyromegaly present.  Cardiovascular: Normal rate, regular rhythm, normal heart sounds and intact distal pulses. Exam reveals no gallop and no friction rub.  No murmur heard. Respiratory: Effort normal and breath sounds normal. No respiratory distress. No wheezes, rales or rhonchi. No chest wall tenderness.  GI: Soft. Bowel sounds are normal. Abdomen is soft, non tender, non distended. No masses or hepatosplenomegaly is  present. There is no rebound and no guarding. I cannot appreciate a hernia.  Musculoskeletal: . Extremities are non tender and without deformity.  Lymphadenopathy: No cervical or axillary adenopathy.  Neurological: Alert and oriented to person, place, and time. Coordination normal.  Skin: Skin is warm and dry. No rash noted. No diaphoresis. No erythema. No pallor. Scab around 1-1.3 cm on upper calf around 6 cm from the popliteal fossa.  Psychiatric: Normal mood and affect.Behavior is normal. Judgment and thought content normal.   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 I discussed this with the patient. I do think she will need some sedation for this as this will be quite a bit of numbing medicine. Also, the skin of her calf is tighter than many people and will require mobilization.  I reviewed the risk of surgery including bleeding, infection, wound breakdown. I discussed that if she does have wound breakdown, she would need to do dressing changes.  She has just recently seen her cardiologist, Dr. Elonda Husky, so I think that it will be relatively straightforward to get cardiac risk stratification for surgery. Dr. Elonda Husky is through the [facility] system.  We will do this at the first available opportunity.  No follow-ups on file.  Georgianne Fick, MD

## 2022-04-25 ENCOUNTER — Encounter: Payer: Self-pay | Admitting: Family Medicine

## 2022-04-25 ENCOUNTER — Encounter (HOSPITAL_COMMUNITY): Payer: Self-pay | Admitting: General Surgery

## 2022-04-26 LAB — SURGICAL PATHOLOGY

## 2022-05-02 ENCOUNTER — Encounter: Payer: Self-pay | Admitting: Family Medicine

## 2022-05-17 ENCOUNTER — Encounter: Payer: Self-pay | Admitting: Family Medicine

## 2022-05-23 ENCOUNTER — Encounter: Payer: Self-pay | Admitting: Family Medicine

## 2022-05-27 NOTE — Patient Instructions (Addendum)
It was good to see you again today!  Recommend COVID booster, RSV vaccine this fall  Please go to the lab and then to x-ray for your chest film Try using wax removal drops in your eats to prevent wax build -up; let me know if you need me to try irrigating your ears again at a later date

## 2022-05-27 NOTE — Progress Notes (Signed)
[place] at [facility] [address] [place], [place] [address] [phone]  Date:  05/28/2022   Name:  Wendy Hebert   DOB:  May 20, 1933   MRN:  [id number]  PCP:  Darreld Mclean, MD    Chief Complaint: 4 month follow up (Concerns/ questions: /Flu shot today: yes/)   History of Present Illness:  Wendy Hebert is a 88 y.o. very pleasant female patient who presents with the following:  Following up today-history of atrial fibrillation, breast cancer, squamous cell cancer of the skin on her right posterior calf Last seen by myself in July- at that time I had excised a SCC from the back of her right calf- however margins + so she was referred to general surgery for wider excision This procedure was done on 8/22 She was seen in follow-up on 9/18-she was treated with doxycycline and also Augmentin for apparent infection of the surgical site Antibiotic was changed to clindamycin on 9/18 and plan for follow-up in 2 weeks Wendy Hebert does feel like her leg is healing, I do the wound is still not closed.  She has not had any fever or other systemic symptoms  Wendy Hebert notes a couple of spots on her body, 2 on her face and one on her left leg which she would like frozen She also notes earwax in her right ear-this is making it hard for her here, she would like irrigation of possible  She notes persistent irritation of the vulvar skin, history of lichen sclerosis.  Advised that her current symptoms likely represent the same condition  Finally, Wendy Hebert notes she has felt short of breath for 2 or 3 months.  This seems to come and go.  She denies any chest pain or pressure She does have a significant smoking history, wonders if she has COPD Echo 2020 no sign of CHF EKG done at time of her surgery, 8/22-persistent A-fib Shingrix-recommend Pneumonia booster Recommend COVID booster Flu shot- give today  Alprazolam as needed Diltiazem  CD180 Eliquis 5 twice daily Losartan 25 twice daily Metoprolol 37.5 AM, 20 5 PM  Patient Active Problem List   Diagnosis Date Noted   Osteopenia 08/25/2019   Malignant neoplasm of upper-inner quadrant of right breast in female, estrogen receptor positive ([place]) 02/16/2019   Atrial fibrillation ([place]) 10/01/2018   Gait difficulty 56/43/3295   Lichen sclerosus 18/84/1660    Past Medical History:  Diagnosis Date   Arthritis    Atrial fibrillation ([place])    Cancer ([place]) 2017   1.6 lumpectomy   Depression    Hypertension    Lichen sclerosus     Past Surgical History:  Procedure Laterality Date   APPENDECTOMY     BOWEL RESECTION     BREAST LUMPECTOMY  2017   CARDIOVERSION N/A 11/10/2018   Procedure: CARDIOVERSION;  Surgeon: [name], MD;  Location: [facility];  Service: Cardiovascular;  Laterality: N/A;   Wendy Hebert Right 04/24/2022   Procedure: WIDE LOCAL EXCISION RIGHT CALF SQUAMOUS CELL CANCER;  Surgeon: Stark Klein, MD;  Location: [facility];  Service: General;  Laterality: Right;   TONSILLECTOMY      Social History   Tobacco Use   Smoking status: Former   Smokeless tobacco: Never   Tobacco comments:    30+ years  Vaping Use   Vaping Use: Never used  Substance Use Topics   Alcohol  use: Not Currently    Comment: occasionally   Drug use: Never    Family History  Problem Relation Age of Onset   Cancer Father        Lung   Cancer Sister     No Known Allergies  Medication list has been reviewed and updated.  Current Outpatient Medications on File Prior to Visit  Medication Sig Dispense Refill   ALPRAZolam (XANAX) 0.5 MG tablet TAKE 1/2 TO 1 TABLET BY  MOUTH TWICE DAILY AS NEEDED FOR ANXIETY (Patient taking differently: Take 0.25 mg by mouth 2 (two) times a week.) 60 tablet 1   BIOTIN PO Take 500 mg by mouth daily.     Calcium Carb-Cholecalciferol (CALCIUM 600 + D PO) Take 1 tablet by mouth every evening.      Cholecalciferol (VITAMIN D3) 50 MCG (2000 UT) capsule Take 2,000 Units by mouth daily.     clobetasol cream (TEMOVATE) 1.61 % Apply 1 Application topically once a week.     cyanocobalamin (VITAMIN B12) 1000 MCG tablet Take 1,000 mcg by mouth daily.     diltiazem (CARDIZEM CD) 180 MG 24 hr capsule Take 180 mg by mouth daily.     ELIQUIS 5 MG TABS tablet TAKE 1 TABLET BY MOUTH  TWICE DAILY 180 tablet 3   losartan (COZAAR) 25 MG tablet Take 25 mg by mouth 2 (two) times daily.     metoprolol tartrate (LOPRESSOR) 25 MG tablet TAKE 1 AND 1/2 TABLETS BY  MOUTH IN THE MORNING AND 1  TABLET IN THE EVENING 225 tablet 3   Multiple Vitamin (MULTIVITAMIN WITH MINERALS) TABS tablet Take 1 tablet by mouth every evening.     Multiple Vitamins-Minerals (PRESERVISION AREDS 2) CAPS Take 2 capsules by mouth at bedtime.     Omega 3 1200 MG CAPS Take 1,200 mg by mouth daily at 2 am.     traZODone (DESYREL) 50 MG tablet Take 1-2 tablets (50-100 mg total) by mouth at bedtime as needed for sleep. (Patient not taking: Reported on 04/18/2022) 180 tablet 3   vitamin C (ASCORBIC ACID) 500 MG tablet Take 500 mg by mouth daily.     VITAMIN E PO Take 90 mg by mouth daily.     No current facility-administered medications on file prior to visit.    Review of Systems:  As per HPI- otherwise negative.   Physical Examination: Vitals:   05/28/22 1420  BP: 124/64  Pulse: 64  Resp: 18  Temp: 97.6 F (36.4 C)  SpO2: 96%   Vitals:   05/28/22 1420  Weight: 140 lb (63.5 kg)  Height: '5\' 1"'$  (1.549 m)   Body mass index is 26.45 kg/m. Ideal Body Weight: Weight in (lb) to have BMI = 25: 132  GEN: no acute distress.  Looks well and her normal self.  Seated in wheelchair HEENT: Atraumatic, Normocephalic.  Ears and Nose: No external deformity. CV: RRR, No M/G/R. No JVD. No thrill. No extra heart sounds. PULM: CTA B, no wheezes, crackles, rhonchi. No retractions. No resp. distress. No accessory muscle use. ABD: S, NT, ND,  +BS. No rebound. No HSM. EXTR: No c/c/e PSYCH: Normally interactive. Conversant.  Placed a new dressing on the right posterior calf; wound appears to be healing by partial secondary intention.  It does not appear acutely infected Patient points out 1 possible actinic keratosis on her left anterior thigh, 1 on the left cheek and 1 on the right forehead.  Verbal consent obtained.  These were all  treated with liquid nitrogen x3 cycles, patient tolerated well with no complications  Cerumen impaction of right ear.  Verbal consent obtained.  Irrigated with copious amounts of warm water, some wax was removed but we were not able to completely clear the ear today.  I advised her to use over-the-counter wax removal drops to clear the rest of the wax, return as needed  Evaluation of vulva today shows atrophic, patchy white skin on the labia consistent with lichen sclerosis Assessment and Plan: SOB (shortness of breath) - Plan: DG Chest 2 View, B Nat Peptide  Impacted cerumen of right ear  Need for influenza vaccination - Plan: Flu Vaccine QUAD High Dose(Fluad)  Actinic keratosis  Lichen sclerosus   As above, patient seen today with a few concerns.  I evaluated the wound on the back of her right leg, placed a clean dressing.  It appears to be healing reasonably well, she is following up with surgery later this week Cryotherapy to suspected actinic keratosis as above Discussed her lichen sclerosus.  Patient states she has a prescription she was given in the past, it sounds like a steroid cream.  Advised this is likely a good treatment-otherwise would recommend having her see gynecology or dermatology for further advice.  She does not wish to pursue this further right now As above, treated her impacted cerumen to the best of our ability today; eventually had to stop as other patients were waiting.  Patient understands, she will treat at home with wax removal drops and let me know if not better  Finally,  patient mentions shortness of breath present for 2 to 3 months.  Suspect COPD.  Known history of atrial for but this is stable We will evaluate for any possible CHF today with chest x-ray, BNP.  Assuming these are normal may want to start an anticholinergic inhaler  Signed Lamar Blinks, MD

## 2022-05-28 ENCOUNTER — Encounter: Payer: Self-pay | Admitting: Family Medicine

## 2022-05-28 ENCOUNTER — Ambulatory Visit (INDEPENDENT_AMBULATORY_CARE_PROVIDER_SITE_OTHER): Payer: Medicare Other | Admitting: Family Medicine

## 2022-05-28 ENCOUNTER — Ambulatory Visit (HOSPITAL_BASED_OUTPATIENT_CLINIC_OR_DEPARTMENT_OTHER)
Admission: RE | Admit: 2022-05-28 | Discharge: 2022-05-28 | Disposition: A | Discharge status: Home / Self Care | Payer: Medicare Other | Source: Ambulatory Visit | Attending: Family Medicine | Admitting: Family Medicine

## 2022-05-28 VITALS — BP 124/64 | HR 64 | Temp 97.6°F | Resp 18 | Ht 61.0 in | Wt 140.0 lb

## 2022-05-28 DIAGNOSIS — R0602 Shortness of breath: Secondary | ICD-10-CM | POA: Insufficient documentation

## 2022-05-28 DIAGNOSIS — Z23 Encounter for immunization: Secondary | ICD-10-CM | POA: Diagnosis not present

## 2022-05-28 DIAGNOSIS — L570 Actinic keratosis: Secondary | ICD-10-CM

## 2022-05-28 DIAGNOSIS — H6121 Impacted cerumen, right ear: Secondary | ICD-10-CM | POA: Diagnosis not present

## 2022-05-28 DIAGNOSIS — L900 Lichen sclerosus et atrophicus: Secondary | ICD-10-CM

## 2022-06-04 ENCOUNTER — Ambulatory Visit (INDEPENDENT_AMBULATORY_CARE_PROVIDER_SITE_OTHER): Payer: Medicare Other | Admitting: Family Medicine

## 2022-06-04 VITALS — BP 124/60 | HR 91 | Temp 97.6°F | Resp 18 | Ht 61.0 in | Wt 140.0 lb

## 2022-06-04 DIAGNOSIS — L03115 Cellulitis of right lower limb: Secondary | ICD-10-CM

## 2022-06-04 MED ORDER — CLINDAMYCIN HCL 300 MG PO CAPS: 300.0000 mg | ORAL_CAPSULE | Freq: Three times a day (TID) | ORAL | 0 refills | Status: DC

## 2022-06-04 NOTE — Progress Notes (Signed)
[place] at [facility] [address] [place], [place] [address] [phone]  Date:  06/04/2022   Name:  Wendy Hebert   DOB:  08-31-1933   MRN:  [id number]  PCP:  Darreld Mclean, MD    Chief Complaint: Wound Check   History of Present Illness:  Wendy Hebert is a 88 y.o. very pleasant female patient who presents with the following:  Following up on a leg wound from surgery today [facility] sent me a message over the weekend, she was not sure if her wound had become infected again and want to follow-up.  She notes there may still be some yellowish discharge on her bandages, otherwise she is feeling okay  Patient Active Problem List   Diagnosis Date Noted   Osteopenia 08/25/2019   Malignant neoplasm of upper-inner quadrant of right breast in female, estrogen receptor positive ([place]) 02/16/2019   Atrial fibrillation ([place]) 10/01/2018   Gait difficulty 33/29/5188   Lichen sclerosus 41/66/0630    Past Medical History:  Diagnosis Date   Arthritis    Atrial fibrillation ([place])    Cancer ([place]) 2017   1.6 lumpectomy   Depression    Hypertension    Lichen sclerosus     Past Surgical History:  Procedure Laterality Date   APPENDECTOMY     BOWEL RESECTION     BREAST LUMPECTOMY  2017   CARDIOVERSION N/A 11/10/2018   Procedure: CARDIOVERSION;  Surgeon: [name], MD;  Location: [place];  Service: Cardiovascular;  Laterality: N/A;   [place] Right 04/24/2022   Procedure: WIDE LOCAL EXCISION RIGHT CALF SQUAMOUS CELL CANCER;  Surgeon: Stark Klein, MD;  Location: [place];  Service: General;  Laterality: Right;   TONSILLECTOMY      Social History   Tobacco Use   Smoking status: Former   Smokeless tobacco: Never   Tobacco comments:    30+ years  Vaping Use   Vaping Use: Never used  Substance Use Topics   Alcohol use: Not Currently    Comment: occasionally   Drug  use: Never    Family History  Problem Relation Age of Onset   Cancer Father        Lung   Cancer Sister     No Known Allergies  Medication list has been reviewed and updated.  Current Outpatient Medications on File Prior to Visit  Medication Sig Dispense Refill   ALPRAZolam (XANAX) 0.5 MG tablet TAKE 1/2 TO 1 TABLET BY  MOUTH TWICE DAILY AS NEEDED FOR ANXIETY (Patient taking differently: Take 0.25 mg by mouth 2 (two) times a week.) 60 tablet 1   amoxicillin-clavulanate (AUGMENTIN) 875-125 MG tablet Take 1 tablet by mouth 2 (two) times daily.     BIOTIN PO Take 500 mg by mouth daily.     Calcium Carb-Cholecalciferol (CALCIUM 600 + D PO) Take 1 tablet by mouth every evening.     Cholecalciferol (VITAMIN D3) 50 MCG (2000 UT) capsule Take 2,000 Units by mouth daily.     clindamycin (CLEOCIN) 300 MG capsule Take by mouth.     clobetasol cream (TEMOVATE) 1.60 % Apply 1 Application topically once a week.     cyanocobalamin (VITAMIN B12) 1000 MCG tablet Take 1,000 mcg by mouth daily.     diltiazem (CARDIZEM CD) 180 MG 24 hr capsule Take 180 mg by mouth daily.  ELIQUIS 5 MG TABS tablet TAKE 1 TABLET BY MOUTH  TWICE DAILY 180 tablet 3   losartan (COZAAR) 25 MG tablet Take 25 mg by mouth 2 (two) times daily.     metoprolol tartrate (LOPRESSOR) 25 MG tablet TAKE 1 AND 1/2 TABLETS BY  MOUTH IN THE MORNING AND 1  TABLET IN THE EVENING 225 tablet 3   Multiple Vitamin (MULTIVITAMIN WITH MINERALS) TABS tablet Take 1 tablet by mouth every evening.     Multiple Vitamins-Minerals (PRESERVISION AREDS 2) CAPS Take 2 capsules by mouth at bedtime.     Omega 3 1200 MG CAPS Take 1,200 mg by mouth daily at 2 am.     traZODone (DESYREL) 50 MG tablet Take 1-2 tablets (50-100 mg total) by mouth at bedtime as needed for sleep. 180 tablet 3   vitamin C (ASCORBIC ACID) 500 MG tablet Take 500 mg by mouth daily.     VITAMIN E PO Take 90 mg by mouth daily.     No current facility-administered medications on file  prior to visit.    Review of Systems:  As per HPI- otherwise negative.   Physical Examination: Vitals:   06/04/22 1140  BP: 124/60  Pulse: 91  Resp: 18  Temp: 97.6 F (36.4 C)  SpO2: 97%   Vitals:   06/04/22 1140  Weight: 140 lb (63.5 kg)  Height: '5\' 1"'$  (1.549 m)   Body mass index is 26.45 kg/m. Ideal Body Weight: Weight in (lb) to have BMI = 25: 132  GEN: No acute distress; alert,appropriate. PULM: Breathing comfortably in no respiratory distress PSYCH: Normally interactive.  Looks well and her normal self, seated in wheelchair.  Wound on posterior right calf is healing by secondary intention, at this point I do not see any definite evidence of infection.  There is some mild erythema around the edges of the wound but this is likely due to healing process. I dressed the wound with Vaseline gauze and Coban wrap  Assessment and Plan: Cellulitis of right leg - Plan: clindamycin (CLEOCIN) 300 MG capsule  Patient seen today for follow-up of right leg postsurgical wound.  She had a squamous cell carcinoma which was removed by general surgery, the wound is had some difficulty in healing and she has been treated with antibiotics.  Right now Wendy Hebert is not taking an antibiotic; she visited with her surgeon on Friday who thought the wound looked okay but advised to start back on clindamycin if she was concerned that infection was returning. I did refill clindamycin for her to have on hand.  However, right now I think okay to forego antibiotics and continue routine wound care  Signed Lamar Blinks, MD

## 2022-06-12 ENCOUNTER — Telehealth: Payer: Self-pay | Admitting: Family Medicine

## 2022-06-12 NOTE — Telephone Encounter (Signed)
Patient called to advise she got the covid shot yesterday and feels rotten this morning. Patient could not give me any specific side effects that she felt besides no energy. Spoke with CMA, Nira Conn, who advised that it is normal to experience some side effects after having a vaccination.   Offered to send a message back to nurse for advice on how to help her manage the side effects, patient declined because she didn't know how to explain and is just feeling bad. Patient was advised to call us back if she experienced any other symptoms or anything got worse.

## 2022-06-15 ENCOUNTER — Encounter: Payer: Self-pay | Admitting: Family Medicine

## 2022-06-21 ENCOUNTER — Encounter: Payer: Self-pay | Admitting: Family Medicine

## 2022-06-26 DIAGNOSIS — C44722 Squamous cell carcinoma of skin of right lower limb, including hip: Secondary | ICD-10-CM | POA: Diagnosis not present

## 2022-07-09 ENCOUNTER — Ambulatory Visit: Payer: Medicare Other | Admitting: Family Medicine

## 2022-07-10 NOTE — Progress Notes (Signed)
[place] at [facility] [address], [place], [place] [address] [phone]  Date:  07/11/2022   Name:  Wendy Hebert   DOB:  [birth year]   MRN:  [id number]  PCP:  Darreld Mclean, MD    Chief Complaint: Wound Check (Pt says she has a bump right above the Insicion. Her friend looked at it and thinks this might be an imbedded stitch. /Concerns/ questions: memory, sleep)   History of Present Illness:  Wendy Hebert is a 88 y.o. 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  There has been some difficulty with wound healing, infection which required antibiotics She followed up most recently on 10/30 and her wound was noted to be healing nicely  Today Wendy Hebert notes a possible bump on her leg near the site of her surgical excision-she is concerned this is another cancer It seems to be getting larger It has been present for about 3 weeks  It is not tender at all  Can give a dose of Prevnar 20- declines today  Flu shot is done Latest COVID is complete, RSV done  Alprazolam as needed Diltiazem 180 Eliquis 5 twice daily Losartan 25 twice daily Metoprolol 25, 1.5 tablets a.m. and 1 tablet p.m. Trazodone Patient Active Problem List   Diagnosis Date Noted   Osteopenia 08/25/2019   Malignant neoplasm of upper-inner quadrant of right breast in female, estrogen receptor positive ([place]) 02/16/2019   Atrial fibrillation ([place]) 10/01/2018   Gait difficulty 17/40/8144   Lichen sclerosus 81/85/6314    Past Medical History:  Diagnosis Date   Arthritis    Atrial fibrillation ([place])     Cancer ([place]) 2017   1.6 lumpectomy   Depression    Hypertension    Lichen sclerosus     Past Surgical History:  Procedure Laterality Date   APPENDECTOMY     BOWEL RESECTION     BREAST LUMPECTOMY  2017   CARDIOVERSION N/A 11/10/2018   Procedure: CARDIOVERSION;  Surgeon: [name], MD;  Location: [place];  Service: Cardiovascular;  Laterality: N/A;   [place] Right 04/24/2022   Procedure: WIDE LOCAL EXCISION RIGHT CALF SQUAMOUS CELL CANCER;  Surgeon: Stark Klein, MD;  Location: [place];  Service: General;  Laterality: Right;   TONSILLECTOMY      Social History   Tobacco Use   Smoking status: Former   Smokeless tobacco: Never   Tobacco comments:    30+ years  Vaping Use   Vaping Use: Never used  Substance Use Topics   Alcohol use: Not Currently    Comment: occasionally   Drug use: Never    Family History  Problem Relation Age of Onset   Cancer Father        Lung   Cancer Sister     No Known Allergies  Medication list has been reviewed and updated.  Current Outpatient Medications on File Prior to Visit  Medication Sig Dispense Refill   ALPRAZolam (XANAX) 0.5 MG tablet TAKE 1/2 TO 1 TABLET BY  MOUTH TWICE DAILY AS  NEEDED FOR ANXIETY (Patient taking differently: Take 0.25 mg by mouth 2 (two) times a week.) 60 tablet 1   BIOTIN PO Take 500 mg by mouth daily.     Calcium Carb-Cholecalciferol (CALCIUM 600 + D PO) Take 1 tablet by mouth every evening.     Cholecalciferol (VITAMIN D3) 50 MCG (2000 UT) capsule Take 2,000 Units by mouth daily.     clobetasol cream (TEMOVATE) 3.54 % Apply 1 Application topically once a week.     cyanocobalamin (VITAMIN B12) 1000 MCG tablet Take 1,000 mcg by mouth daily.     diltiazem (CARDIZEM CD) 180 MG 24 hr capsule Take 180 mg by mouth daily.     ELIQUIS 5 MG TABS tablet TAKE 1 TABLET BY MOUTH  TWICE DAILY 180 tablet 3   losartan (COZAAR) 25 MG tablet Take 25 mg by mouth 2 (two)  times daily.     metoprolol tartrate (LOPRESSOR) 25 MG tablet TAKE 1 AND 1/2 TABLETS BY  MOUTH IN THE MORNING AND 1  TABLET IN THE EVENING 225 tablet 3   Multiple Vitamin (MULTIVITAMIN WITH MINERALS) TABS tablet Take 1 tablet by mouth every evening.     Multiple Vitamins-Minerals (PRESERVISION AREDS 2) CAPS Take 2 capsules by mouth at bedtime.     Omega 3 1200 MG CAPS Take 1,200 mg by mouth daily at 2 am.     traZODone (DESYREL) 50 MG tablet Take 1-2 tablets (50-100 mg total) by mouth at bedtime as needed for sleep. 180 tablet 3   vitamin C (ASCORBIC ACID) 500 MG tablet Take 500 mg by mouth daily.     VITAMIN E PO Take 90 mg by mouth daily.     No current facility-administered medications on file prior to visit.    Review of Systems:  As per HPI- otherwise negative.   Physical Examination: Vitals:   07/11/22 1256  BP: 126/80  Pulse: 63  Resp: 18  Temp: 97.8 F (36.6 C)  SpO2: 93%   Vitals:   07/11/22 1256  Weight: 138 lb (62.6 kg)  Height: '5\' 1"'$  (1.549 m)   Body mass index is 26.07 kg/m. Ideal Body Weight: Weight in (lb) to have BMI = 25: 132  GEN: no acute distress.  Appears her normal self HEENT: Atraumatic, Normocephalic.  Ears and Nose: No external deformity. CV: RRR, No M/G/R. No JVD. No thrill. No extra heart sounds. PULM: CTA B, no wheezes, crackles, rhonchi. No retractions. No resp. distress. No accessory muscle use. ABD: S, NT, ND EXTR: No c/c/e PSYCH: Normally interactive. Conversant.  Posterior right leg: New nodule superior to previous skin cancer resection- concerning for recurrent skin cancer  1cm x .75cm in size     Assessment and Plan: SCC (squamous cell carcinoma)  Atrial fibrillation, unspecified type ([place]) - Plan: apixaban (ELIQUIS) 5 MG TABS tablet  Patient seen today with finding concerning for another squamous cell carcinoma on her posterior leg.  Would like to have her see dermatology, unfortunately this may be a challenge due to shortage  of providers.  Advised patient I will need to make some phone calls and try to get her an appointment  Refilled her Eliquis which she takes for atrial fibrillation  Signed Lamar Blinks, MD

## 2022-07-10 NOTE — Patient Instructions (Signed)
It was great to see you again today!  

## 2022-07-11 ENCOUNTER — Ambulatory Visit (INDEPENDENT_AMBULATORY_CARE_PROVIDER_SITE_OTHER): Payer: Medicare Other | Admitting: Family Medicine

## 2022-07-11 VITALS — BP 126/80 | HR 63 | Temp 97.8°F | Resp 18 | Ht 61.0 in | Wt 138.0 lb

## 2022-07-11 DIAGNOSIS — C4492 Squamous cell carcinoma of skin, unspecified: Secondary | ICD-10-CM

## 2022-07-11 DIAGNOSIS — I4891 Unspecified atrial fibrillation: Secondary | ICD-10-CM

## 2022-07-11 MED ORDER — CLONAZEPAM 0.5 MG PO TABS: 0.2500 mg | ORAL_TABLET | Freq: Every day | ORAL | 0 refills | Status: DC

## 2022-07-11 MED ORDER — APIXABAN 5 MG PO TABS
5.0000 mg | ORAL_TABLET | Freq: Two times a day (BID) | ORAL | 3 refills | Status: DC
Start: 2022-07-11 — End: 2023-03-04

## 2022-07-12 ENCOUNTER — Other Ambulatory Visit: Payer: Self-pay | Admitting: Family Medicine

## 2022-07-12 NOTE — Progress Notes (Signed)
Spoke with [facility] derm- they will need clinical info to determine if they can see her and how soon Kristine Garbe- please fax to [phone] Notes from 11/8, 10/2, 7/17 Path reports from 8/22 and 7/17 Thank you!

## 2022-07-13 ENCOUNTER — Encounter: Payer: Self-pay | Admitting: Family Medicine

## 2022-07-14 ENCOUNTER — Encounter: Payer: Self-pay | Admitting: Family Medicine

## 2022-07-16 ENCOUNTER — Telehealth: Payer: Self-pay | Admitting: Family Medicine

## 2022-07-16 NOTE — Telephone Encounter (Signed)
Copied from [place]. Topic: Medicare AWV >> Jul 16, 2022  2:24 PM Gillis Santa wrote: Reason for CRM: LVM FOR PATIENT TO CALL [phone] TO SCHEDULE AWVS [place]

## 2022-07-17 DIAGNOSIS — L821 Other seborrheic keratosis: Secondary | ICD-10-CM | POA: Diagnosis not present

## 2022-07-17 DIAGNOSIS — C44722 Squamous cell carcinoma of skin of right lower limb, including hip: Secondary | ICD-10-CM | POA: Diagnosis not present

## 2022-07-27 ENCOUNTER — Encounter: Payer: Self-pay | Admitting: Family Medicine

## 2022-07-31 ENCOUNTER — Encounter: Payer: Self-pay | Admitting: Family Medicine

## 2022-08-02 DIAGNOSIS — C44722 Squamous cell carcinoma of skin of right lower limb, including hip: Secondary | ICD-10-CM | POA: Diagnosis not present

## 2022-08-09 ENCOUNTER — Encounter: Payer: Self-pay | Admitting: Family Medicine

## 2022-08-10 ENCOUNTER — Encounter: Payer: Self-pay | Admitting: Family Medicine

## 2022-08-10 NOTE — Telephone Encounter (Signed)
We have no Eliquis 5, only 2.5 mg. Immunization has been updated.

## 2022-08-15 ENCOUNTER — Encounter: Payer: Self-pay | Admitting: Family Medicine

## 2022-09-01 ENCOUNTER — Other Ambulatory Visit: Payer: Self-pay | Admitting: Family Medicine

## 2022-09-01 DIAGNOSIS — I4891 Unspecified atrial fibrillation: Secondary | ICD-10-CM

## 2022-09-09 DIAGNOSIS — R1111 Vomiting without nausea: Secondary | ICD-10-CM | POA: Diagnosis not present

## 2022-09-09 DIAGNOSIS — B9681 Helicobacter pylori [H. pylori] as the cause of diseases classified elsewhere: Secondary | ICD-10-CM | POA: Diagnosis not present

## 2022-09-09 DIAGNOSIS — K5730 Diverticulosis of large intestine without perforation or abscess without bleeding: Secondary | ICD-10-CM | POA: Diagnosis not present

## 2022-09-09 DIAGNOSIS — K2950 Unspecified chronic gastritis without bleeding: Secondary | ICD-10-CM | POA: Diagnosis not present

## 2022-09-09 DIAGNOSIS — W19XXXA Unspecified fall, initial encounter: Secondary | ICD-10-CM | POA: Diagnosis not present

## 2022-09-09 DIAGNOSIS — I110 Hypertensive heart disease with heart failure: Secondary | ICD-10-CM | POA: Diagnosis not present

## 2022-09-09 DIAGNOSIS — E860 Dehydration: Secondary | ICD-10-CM | POA: Diagnosis not present

## 2022-09-09 DIAGNOSIS — K259 Gastric ulcer, unspecified as acute or chronic, without hemorrhage or perforation: Secondary | ICD-10-CM | POA: Diagnosis not present

## 2022-09-09 DIAGNOSIS — I509 Heart failure, unspecified: Secondary | ICD-10-CM | POA: Diagnosis not present

## 2022-09-09 DIAGNOSIS — I499 Cardiac arrhythmia, unspecified: Secondary | ICD-10-CM | POA: Diagnosis not present

## 2022-09-09 DIAGNOSIS — K2960 Other gastritis without bleeding: Secondary | ICD-10-CM | POA: Diagnosis not present

## 2022-09-09 DIAGNOSIS — Z87891 Personal history of nicotine dependence: Secondary | ICD-10-CM | POA: Diagnosis not present

## 2022-09-09 DIAGNOSIS — K922 Gastrointestinal hemorrhage, unspecified: Secondary | ICD-10-CM | POA: Diagnosis not present

## 2022-09-09 DIAGNOSIS — K449 Diaphragmatic hernia without obstruction or gangrene: Secondary | ICD-10-CM | POA: Diagnosis not present

## 2022-09-09 DIAGNOSIS — Z7901 Long term (current) use of anticoagulants: Secondary | ICD-10-CM | POA: Diagnosis not present

## 2022-09-09 DIAGNOSIS — Z743 Need for continuous supervision: Secondary | ICD-10-CM | POA: Diagnosis not present

## 2022-09-09 DIAGNOSIS — R531 Weakness: Secondary | ICD-10-CM | POA: Diagnosis not present

## 2022-09-09 DIAGNOSIS — K920 Hematemesis: Secondary | ICD-10-CM | POA: Diagnosis not present

## 2022-09-09 DIAGNOSIS — K921 Melena: Secondary | ICD-10-CM | POA: Diagnosis not present

## 2022-09-09 DIAGNOSIS — I4891 Unspecified atrial fibrillation: Secondary | ICD-10-CM | POA: Diagnosis not present

## 2022-09-09 DIAGNOSIS — I700 Atherosclerosis of aorta: Secondary | ICD-10-CM | POA: Diagnosis not present

## 2022-09-10 DIAGNOSIS — N183 Chronic kidney disease, stage 3 unspecified: Secondary | ICD-10-CM | POA: Diagnosis not present

## 2022-09-10 DIAGNOSIS — K449 Diaphragmatic hernia without obstruction or gangrene: Secondary | ICD-10-CM | POA: Diagnosis not present

## 2022-09-10 DIAGNOSIS — K922 Gastrointestinal hemorrhage, unspecified: Secondary | ICD-10-CM | POA: Diagnosis not present

## 2022-09-10 DIAGNOSIS — I4891 Unspecified atrial fibrillation: Secondary | ICD-10-CM | POA: Diagnosis not present

## 2022-09-11 ENCOUNTER — Encounter: Payer: Self-pay | Admitting: Family Medicine

## 2022-09-22 NOTE — Progress Notes (Unsigned)
[place] at [facility] [address] [place],  [address] [phone]  Date:  09/26/2022   Name:  Wendy Hebert   DOB:  [birth year]   MRN:  [id number]  PCP:  Darreld Mclean, Hebert    Chief Complaint: No chief complaint on file.   History of Present Illness:  Wendy Hebert is a 89 y.o. 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 female with past medical history including CKD stage 3 and atrial fibrillation admitted to [facility] on 09/09/22 for GI bleed. For further treatment and refer to HPI, Patient had EGD prior to admission that showed medium sized hiatal hernia with Lysbeth Galas erosions seen. Patient was given 1 dose of Kcentra, IV Protonix and 1 L lactated ringers in the ED. Admitted to hospitalist service for further management patient was admitted overnight for observation, hemoglobin 13.2, repeat 10.6,CT A/P showed nonspecific infectious, inflammatory, or ischemic enteritis. Patient is afebrile and without leukocytosis, no indication for antibiotics at this time. Patient is currently pain free on exam, continue to monitor. Patient denies any bleeding, patient Eliquis was on hold, case was discussed with GI at time of admission, watch overnight, GI recommend follow-up with pathology result avoid NSAID continue with PPI, GI cleared for discharge, patient tolerating diet, GI signed off, patient is stable, follow-up outpatient with GI and PCP 1 to 2-week, routine labs, patient is tolerating diet denies nausea vomiting no bleeding, repeat hemoglobin 10.9 this morning Post Discharge Follow Up Issues:  Follow-up PCP  1 to 2-week repeat routine labs in 1 to 2-weeks Patient Active Problem List   Diagnosis Date Noted   Osteopenia 08/25/2019   Malignant neoplasm of upper-inner quadrant of right breast in female, estrogen receptor positive ([place]) 02/16/2019   Atrial fibrillation ([place]) 10/01/2018   Gait difficulty 32/35/5732   Lichen sclerosus 20/25/4270    Past Medical History:  Diagnosis Date   Arthritis    Atrial fibrillation ([place])    Cancer ([place]) 2017   1.6 lumpectomy   Depression    Hypertension    Lichen sclerosus     Past Surgical History:  Procedure Laterality Date   APPENDECTOMY     BOWEL RESECTION     BREAST LUMPECTOMY  2017   CARDIOVERSION N/A 11/10/2018   Procedure: CARDIOVERSION;  Surgeon: [name], Hebert;  Location: [place];  Service: Cardiovascular;  Laterality: N/A;   [place] Right 04/24/2022   Procedure: WIDE LOCAL EXCISION RIGHT CALF SQUAMOUS CELL CANCER;  Surgeon: Stark Klein, Hebert;  Location: [place];  Service: General;  Laterality: Right;   TONSILLECTOMY      Social History   Tobacco Use   Smoking status: Former   Smokeless tobacco: Never   Tobacco comments:    30+ years  Vaping Use   Vaping Use: Never used  Substance Use Topics   Alcohol use: Not Currently    Comment: occasionally   Drug use: Never    Family History  Problem Relation Age of Onset   Cancer Father        Lung   Cancer Sister  No Known Allergies  Medication list has been reviewed and updated.  Current Outpatient Medications on File Prior to Visit  Medication Sig Dispense Refill   ALPRAZolam (XANAX) 0.5 MG tablet TAKE 1/2 TO 1 TABLET BY  MOUTH TWICE DAILY AS NEEDED FOR ANXIETY (Patient taking differently: Take 0.25 mg by mouth 2 (two) times a week.) 60 tablet 1   apixaban (ELIQUIS) 5 MG TABS tablet Take 1 tablet (5 mg total) by mouth 2 (two) times daily. 180 tablet 3   BIOTIN PO Take 500 mg by mouth daily.     Calcium  Carb-Cholecalciferol (CALCIUM 600 + D PO) Take 1 tablet by mouth every evening.     Cholecalciferol (VITAMIN D3) 50 MCG (2000 UT) capsule Take 2,000 Units by mouth daily.     clobetasol cream (TEMOVATE) 4.49 % Apply 1 Application topically once a week.     clonazePAM (KLONOPIN) 0.5 MG tablet Take 0.5-1 tablets (0.25-0.5 mg total) by mouth at bedtime. Use as needed 30 tablet 0   cyanocobalamin (VITAMIN B12) 1000 MCG tablet Take 1,000 mcg by mouth daily.     diltiazem (CARDIZEM CD) 180 MG 24 hr capsule Take 180 mg by mouth daily.     losartan (COZAAR) 25 MG tablet Take 25 mg by mouth 2 (two) times daily.     metoprolol tartrate (LOPRESSOR) 25 MG tablet TAKE 1 AND 1/2 TABLETS BY MOUTH  IN THE MORNING AND 1 TABLET BY  MOUTH IN THE EVENING 175 tablet 0   Multiple Vitamin (MULTIVITAMIN WITH MINERALS) TABS tablet Take 1 tablet by mouth every evening.     Multiple Vitamins-Minerals (PRESERVISION AREDS 2) CAPS Take 2 capsules by mouth at bedtime.     Omega 3 1200 MG CAPS Take 1,200 mg by mouth daily at 2 am.     traZODone (DESYREL) 50 MG tablet Take 1-2 tablets (50-100 mg total) by mouth at bedtime as needed for sleep. 180 tablet 3   vitamin C (ASCORBIC ACID) 500 MG tablet Take 500 mg by mouth daily.     VITAMIN E PO Take 90 mg by mouth daily.     No current facility-administered medications on file prior to visit.    Review of Systems:  As per HPI- otherwise negative.   Physical Examination: There were no vitals filed for this visit. There were no vitals filed for this visit. There is no height or weight on file to calculate BMI. Ideal Body Weight:    GEN: no acute distress. HEENT: Atraumatic, Normocephalic.  Ears and Nose: No external deformity. CV: RRR, No M/G/R. No JVD. No thrill. No extra heart sounds. PULM: CTA B, no wheezes, crackles, rhonchi. No retractions. No resp. distress. No accessory muscle use. ABD: S, NT, ND, +BS. No rebound. No HSM. EXTR: No c/c/e PSYCH: Normally  interactive. Conversant.    Assessment and Plan: ***  Signed Wendy Hebert

## 2022-09-22 NOTE — Patient Instructions (Incomplete)
It was good to see you again today, I am glad you are feeling better I am hoping you will feel even more improved once you finish the antibiotics, the metronidazole in particular can make you feel kind of nauseated  Please continue the pantoprazole for at least 2 or 3 months  Let me know if any more episodes of unusual stools or vomiting  I will be in touch with your labs soon as possible  As we discussed, I do think an assisted living facility might be a good choice for you.  [facility], [facility], [facility], [facility], [facility] in [place] are all great options

## 2022-09-26 ENCOUNTER — Encounter: Payer: Self-pay | Admitting: Family Medicine

## 2022-09-26 ENCOUNTER — Ambulatory Visit (INDEPENDENT_AMBULATORY_CARE_PROVIDER_SITE_OTHER): Payer: Medicare Other | Admitting: Family Medicine

## 2022-09-26 VITALS — BP 124/85 | HR 91 | Ht 61.0 in | Wt 144.0 lb

## 2022-09-26 DIAGNOSIS — K922 Gastrointestinal hemorrhage, unspecified: Secondary | ICD-10-CM | POA: Diagnosis not present

## 2022-09-26 LAB — CBC
HCT: 35 % — ABNORMAL LOW (ref 36.0–46.0)
Hemoglobin: 11.7 g/dL — ABNORMAL LOW (ref 12.0–15.0)
MCHC: 33.3 g/dL (ref 30.0–36.0)
MCV: 93.5 fl (ref 78.0–100.0)
Platelets: 390 10*3/uL (ref 150.0–400.0)
RBC: 3.75 Mil/uL — ABNORMAL LOW (ref 3.87–5.11)
RDW: 16 % — ABNORMAL HIGH (ref 11.5–15.5)
WBC: 8.3 10*3/uL (ref 4.0–10.5)

## 2022-09-26 LAB — BASIC METABOLIC PANEL
BUN: 12 mg/dL (ref 6–23)
CO2: 24 mEq/L (ref 19–32)
Calcium: 8.2 mg/dL — ABNORMAL LOW (ref 8.4–10.5)
Chloride: 106 mEq/L (ref 96–112)
Creatinine, Ser: 0.9 mg/dL (ref 0.40–1.20)
GFR: 57.52 mL/min — ABNORMAL LOW (ref >60.00)
Glucose, Bld: 123 mg/dL — ABNORMAL HIGH (ref 70–99)
Potassium: 3.9 mEq/L (ref 3.5–5.1)
Sodium: 138 mEq/L (ref 135–145)

## 2022-09-26 LAB — FECAL OCCULT BLOOD, IMMUNOCHEMICAL: Fecal Occult Blood: POSITIVE

## 2022-09-26 LAB — FERRITIN: Ferritin: 56.5 ng/mL (ref 10.0–291.0)

## 2022-09-26 MED ORDER — PANTOPRAZOLE SODIUM 40 MG PO TBEC: 40.0000 mg | DELAYED_RELEASE_TABLET | Freq: Every day | ORAL | 3 refills | Status: DC

## 2022-10-11 ENCOUNTER — Encounter (HOSPITAL_COMMUNITY): Payer: Self-pay | Admitting: *Deleted

## 2022-10-11 ENCOUNTER — Encounter: Payer: Self-pay | Admitting: Family Medicine

## 2022-10-11 DIAGNOSIS — M10072 Idiopathic gout, left ankle and foot: Secondary | ICD-10-CM

## 2022-10-11 MED ORDER — CLONAZEPAM 0.5 MG PO TABS: 0.2500 mg | ORAL_TABLET | Freq: Every day | ORAL | 1 refills | Status: DC

## 2022-10-11 MED ORDER — HYDROCODONE-ACETAMINOPHEN 5-325 MG PO TABS: 1.0000 | ORAL_TABLET | Freq: Four times a day (QID) | ORAL | 0 refills | Status: DC | PRN

## 2022-10-22 ENCOUNTER — Encounter: Payer: Self-pay | Admitting: Family Medicine

## 2022-10-23 ENCOUNTER — Other Ambulatory Visit: Payer: Self-pay | Admitting: Family Medicine

## 2022-10-23 DIAGNOSIS — I4891 Unspecified atrial fibrillation: Secondary | ICD-10-CM

## 2022-11-14 ENCOUNTER — Telehealth: Payer: Self-pay | Admitting: Family Medicine

## 2022-11-14 DIAGNOSIS — A048 Other specified bacterial intestinal infections: Secondary | ICD-10-CM | POA: Diagnosis not present

## 2022-11-14 NOTE — Telephone Encounter (Signed)
Copied from [place] [id number]. Topic: Medicare AWV >> Nov 14, 2022 11:42 AM Devoria Glassing wrote: Reason for CRM: Called patient to schedule Medicare Annual Wellness Visit (AWV). Left message for patient to call back and schedule Medicare Annual Wellness Visit (AWV).  Last date of AWV: 05/22/21  Please schedule an appointment at any time with Beatris Ship, [place] .  If any questions, please contact me.  Thank you ,  Sherol Dade; [place] Direct Dial: [phone]

## 2022-11-14 NOTE — Telephone Encounter (Signed)
Contacted Wendy Hebert to schedule their annual wellness visit. Appointment made for 11/20/2022.  Sherol Dade; Care Guide Amb[facility] Direct Dial: [phone]

## 2022-11-15 ENCOUNTER — Other Ambulatory Visit: Payer: Self-pay | Admitting: Family Medicine

## 2022-11-15 DIAGNOSIS — F419 Anxiety disorder, unspecified: Secondary | ICD-10-CM

## 2022-11-18 ENCOUNTER — Encounter: Payer: Self-pay | Admitting: Family Medicine

## 2022-11-20 ENCOUNTER — Ambulatory Visit (INDEPENDENT_AMBULATORY_CARE_PROVIDER_SITE_OTHER): Payer: Medicare Other | Admitting: *Deleted

## 2022-11-20 DIAGNOSIS — Z0000 Encounter for general adult medical examination without abnormal findings: Secondary | ICD-10-CM | POA: Diagnosis not present

## 2022-11-20 NOTE — Progress Notes (Signed)
Subjective:   Wendy Hebert is a 89 y.o. female who presents for Medicare Annual (Subsequent) preventive examination.  I connected with  Wendy Hebert on 11/20/22 by a audio enabled telemedicine application and verified that I am speaking with the correct person using two identifiers.  Patient Location: Home  Provider Location: Office/Clinic  I discussed the limitations of evaluation and management by telemedicine. The patient expressed understanding and agreed to proceed.   Review of Systems     Cardiac Risk Factors include: advanced age (>45men, >59 women);hypertension     Objective:    There were no vitals filed for this visit. There is no height or weight on file to calculate BMI.     11/20/2022    3:42 PM 04/24/2022    9:59 AM 05/22/2021    3:09 PM 11/10/2018    9:26 AM 10/17/2018   11:32 PM  Advanced Directives  Does Patient Have a Medical Advance Directive? Yes Yes Yes Yes Yes  Type of Paramedic of [place];Living will [place];Living will [place];Living will Healthcare Power of [place];Living will  Does patient want to make changes to medical advance directive? No - Patient declined      Copy of [place] in Chart? No - copy requested  No - copy requested No - copy requested No - copy requested  Would patient like information on creating a medical advance directive?     No - Patient declined    Current Medications (verified) Outpatient Encounter Medications as of 11/20/2022  Medication Sig   ALPRAZolam (XANAX) 0.5 MG tablet TAKE 1/2 TO 1 TABLET BY MOUTH  TWICE DAILY AS NEEDED FOR  ANXIETY   apixaban (ELIQUIS) 5 MG TABS tablet Take 1 tablet (5 mg total) by mouth 2 (two) times daily.   BIOTIN PO Take 500 mg by mouth daily.   Calcium Carb-Cholecalciferol (CALCIUM 600 + D PO) Take 1 tablet by mouth every evening.   Cholecalciferol (VITAMIN D3) 50 MCG  (2000 UT) capsule Take 2,000 Units by mouth daily.   clobetasol cream (TEMOVATE) [id number] % Apply 1 Application topically once a week.   clonazePAM (KLONOPIN) 0.5 MG tablet Take 0.5-1 tablets (0.25-0.5 mg total) by mouth at bedtime. Use as needed   cyanocobalamin (VITAMIN B12) 1000 MCG tablet Take 1,000 mcg by mouth daily.   diltiazem (CARDIZEM CD) 180 MG 24 hr capsule Take 180 mg by mouth daily.   HYDROcodone-acetaminophen (NORCO/VICODIN) 5-325 MG tablet Take 1 tablet by mouth every 6 (six) hours as needed.   losartan (COZAAR) 25 MG tablet Take 25 mg by mouth 2 (two) times daily.   metoprolol tartrate (LOPRESSOR) 25 MG tablet TAKE 1 AND 1/2 TABLETS BY MOUTH  IN THE MORNING AND 1 TABLET BY  MOUTH IN THE EVENING   Multiple Vitamin (MULTIVITAMIN WITH MINERALS) TABS tablet Take 1 tablet by mouth every evening.   Multiple Vitamins-Minerals (PRESERVISION AREDS 2) CAPS Take 2 capsules by mouth at bedtime.   Omega 3 1200 MG CAPS Take 1,200 mg by mouth daily at 2 am.   pantoprazole (PROTONIX) 40 MG tablet Take 1 tablet (40 mg total) by mouth daily.   traZODone (DESYREL) 50 MG tablet Take 1-2 tablets (50-100 mg total) by mouth at bedtime as needed for sleep.   vitamin C (ASCORBIC ACID) 500 MG tablet Take 500 mg by mouth daily.   VITAMIN E PO Take 90 mg by mouth daily.  No facility-administered encounter medications on file as of 11/20/2022.    Allergies (verified) Patient has no known allergies.   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;  Surgeon: Stark Klein, MD;  Location: [facility];  Service: General;  Laterality: Right;   TONSILLECTOMY     Family History  Problem Relation Age of Onset   Cancer Father        Lung   Cancer Sister    Social History   Socioeconomic History   Marital status: Widowed    Spouse name: joe   Number of children: 6   Years of education: Not on file   Highest education level: Bachelor's degree (e.g., BA, AB, BS)  Occupational History   Occupation: Visual merchandiser    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 She drinks 4 cups of coffee and 4-5 glasses of tea a day.   Social Determinants of Health   Financial Resource Strain: Low Risk  (05/22/2021)   Overall Financial Resource Strain (CARDIA)    Difficulty of Paying Living Expenses: Not hard at all  Food Insecurity: No Food Insecurity (11/20/2022)   Hunger Vital Sign    Worried About Running Out of Food in the Last Year: Never true    Ran Out of Food in the Last Year: Never true  Transportation Needs: No Transportation Needs (11/20/2022)   PRAPARE - Hydrologist (Medical): No    Lack of Transportation (Non-Medical): No  Physical Activity: Inactive (05/22/2021)   Exercise Vital Sign    Days of Exercise per Week: 0 days    Minutes of Exercise per Session: 0 min  Stress: Stress Concern Present (05/22/2021)   [place]    Feeling of Stress : To  some extent  Social Connections: Moderately Integrated (05/22/2021)   Social Connection and Isolation Panel [NHANES]    Frequency of Communication with Friends and Family: More than three times a week    Frequency of Social Gatherings with Friends and Family: Never    Attends Religious Services: 1 to 4 times per year    Active Member of [employer] or Organizations: No    Attends Archivist Meetings: Never    Marital Status: Married    Tobacco Counseling Counseling given: Not Answered Tobacco comments: 30+  years   Clinical Intake:  Pre-visit preparation completed: Yes  Pain : No/denies pain  Diabetes: No  How often do you need to have someone help you when you read instructions, pamphlets, or other written materials from your doctor or pharmacy?: 1 - Never  Activities of Daily Living    11/20/2022    3:56 PM 04/24/2022   10:10 AM  In your present state of health, do you have any difficulty performing the following activities:  Hearing? 1   Comment has hearing aids but doesn't really wear them   Vision? 1   Comment some fuzziness   Difficulty concentrating or making decisions? 1   Walking or climbing stairs? 1   Dressing or bathing? 0   Doing errands, shopping? 0 0  Preparing Food and eating ? Y   Comment can't stand for long periods of time   Using the Toilet? N   In the past six months, have you accidently leaked urine? N   Do you have problems with loss of bowel control? N   Managing your Medications? N   Managing your Finances? N   Housekeeping or managing your Housekeeping? N     Patient Care Team: Copland, Gay Filler, MD as PCP - General (Family Medicine) Sherran Needs, NP as Nurse Practitioner (Nurse Practitioner) Magrinat, Virgie Dad, MD (Inactive) as Consulting Physician (Oncology) Modesto Charon, PA-C as Physician Assistant (Obstetrics and Gynecology) Marcy Panning, MD as Referring Physician (Oncology) Hermelinda Medicus, MD as Consulting Physician  (Internal Medicine) Beverely Pace, MD as Consulting Physician (Surgical Oncology)  Indicate any recent Medical Services you may have received from other than [facility] providers in the past year (date may be approximate).     Assessment:   This is a routine wellness examination for [place].  Hearing/Vision screen No results found.  Dietary issues and exercise activities discussed: Current Exercise Habits: The patient does not participate in regular exercise at present, Exercise limited by: orthopedic condition(s)   Goals Addressed   None    Depression Screen    11/20/2022    3:56 PM 01/31/2022    3:19 PM 05/22/2021    3:14 PM 11/03/2020    2:31 PM 03/20/2018    2:03 PM  PHQ 2/9 Scores  PHQ - 2 Score 0 0 1 0 2    Fall Risk    11/20/2022    3:49 PM 01/31/2022    3:19 PM 05/22/2021    3:11 PM 03/15/2021    3:28 PM 08/08/2020    2:04 PM  [place] in the past year? 1 0 0 0 1  Number falls in past yr: 1 0 0 1 1  Injury with Fall? 0 0 0 1 0  Risk for fall due to : Impaired balance/gait;History of fall(s)   Impaired balance/gait   Follow up Falls evaluation completed  Falls prevention discussed Falls evaluation completed     FALL RISK PREVENTION PERTAINING TO THE HOME:  Any stairs in or around the home? Yes  If so, are there any without handrails? No  Home free of loose throw rugs in walkways, pet beds, electrical cords, etc? Yes  Adequate lighting in your home to reduce risk of falls? Yes   ASSISTIVE DEVICES UTILIZED TO PREVENT FALLS:  Life alert? Yes  Use of a cane, walker or w/c? Yes  Grab bars in the bathroom? No  Shower chair or bench in shower? Yes  Elevated toilet seat or a handicapped toilet? Yes  TIMED UP AND GO:  Was the test performed?  No, audio visit .    Cognitive Function:    11/20/2022    4:18 PM  MMSE - Mini Mental State Exam  Not completed: Unable to complete        Immunizations Immunization History  Administered Date(s) Administered    Fluad Quad(high Dose 65+) 06/06/2019, 05/28/2022   Influenza Split 05/31/2021   Influenza, High Dose Seasonal PF 06/26/2017, 05/30/2018, 06/06/2019   Influenza, Seasonal, Injecte, Preservative Fre 05/30/2018   Influenza-Unspecified 05/30/2018, 06/06/2019, 06/23/2020   PFIZER(Purple Top)SARS-COV-2 Vaccination 09/17/2019, 10/08/2019, 06/16/2020, 12/09/2020   PNEUMOCOCCAL CONJUGATE-20 08/08/2022   Pfizer Covid-19 Vaccine Bivalent Booster 62yrs & up 05/31/2021   Pneumococcal Polysaccharide-23 08/25/1987, 09/03/2012   Respiratory Syncytial Virus Vaccine,Recomb Aduvanted(Arexvy) 06/17/2022   Tdap 02/25/2019   Unspecified SARS-COV-2 Vaccination 06/10/2022    TDAP status: Up to date  Flu Vaccine status: Up to date  Pneumococcal vaccine status: Up to date  Covid-19 vaccine status: Information provided on how to obtain vaccines.   Qualifies for Shingles Vaccine? Yes   Zostavax completed No   Shingrix Completed?: No.    Education has been provided regarding the importance of this vaccine. Patient has been advised to call insurance company to determine out of pocket expense if they have not yet received this vaccine. Advised may also receive vaccine at local pharmacy or Health Dept. Verbalized acceptance and understanding.  Screening Tests Health Maintenance  Topic Date Due   Zoster Vaccines- Shingrix (1 of 2) Never done   Medicare Annual Wellness (AWV)  05/22/2022   MAMMOGRAM  07/12/2022   COVID-19 Vaccine (7 - 2023-24 season) 08/05/2022   DTaP/Tdap/Td (2 - Td or Tdap) 02/24/2029   Pneumonia Vaccine 47+ Years old  Completed   INFLUENZA VACCINE  Completed   DEXA SCAN  Completed   HPV VACCINES  Aged Out    Health Maintenance  Health Maintenance Due  Topic Date Due   Zoster Vaccines- Shingrix (1 of 2) Never done   Medicare Annual Wellness (AWV)  05/22/2022   MAMMOGRAM  07/12/2022   COVID-19 Vaccine (7 - 2023-24 season) 08/05/2022    Colorectal cancer screening: No longer  required.   Mammogram status: Completed 07/12/21. Repeat every year  Bone Density status: Pt declined.  Lung Cancer Screening: (Low Dose CT Chest recommended if Age 12-80 years, 30 pack-year currently smoking OR have quit w/in 15years.) does not qualify.   Additional Screening:  Hepatitis C Screening: does not qualify  Vision Screening: Recommended annual ophthalmology exams for early detection of glaucoma and other disorders of the eye. Is the patient up to date with their annual eye exam?  No  Who is the provider or what is the name of the office in which the patient attends annual eye exams? [facility]. If pt is not established with a provider, would they like to be referred to a provider to establish care? No .   Dental Screening: Recommended annual dental exams for proper oral hygiene  Community Resource Referral / Chronic Care Management: CRR required this visit?  No   CCM required this visit?  No      Plan:     I have personally reviewed and noted the following in the patient's chart:   Medical and social history Use of alcohol, tobacco or illicit drugs  Current medications and supplements including opioid prescriptions. Patient is currently taking opioid prescriptions. Information provided to patient regarding non-opioid alternatives. Patient advised to discuss non-opioid treatment plan  with their provider. Functional ability and status Nutritional status Physical activity Advanced directives List of other physicians Hospitalizations, surgeries, and ER visits in previous 12 months Vitals Screenings to include cognitive, depression, and falls Referrals and appointments  In addition, I have reviewed and discussed with patient certain preventive protocols, quality metrics, and best practice recommendations. A written personalized care plan for preventive services as well as general preventive health recommendations were provided to patient.   Due to this being a  telephonic visit, the after visit summary with patients personalized plan was offered to patient via mail or my-chart. Patient would like to access on my-chart.  Beatris Ship, [place]   11/20/2022   Nurse Notes: None

## 2022-11-20 NOTE — Patient Instructions (Signed)
Wendy Hebert , Thank you for taking time to come for your Medicare Wellness Visit. I appreciate your ongoing commitment to your health goals. Please review the following plan we discussed and let me know if I can assist you in the future.     This is a list of the screening recommended for you and due dates:  Health Maintenance  Topic Date Due   Zoster (Shingles) Vaccine (1 of 2) Never done   Mammogram  07/12/2022   COVID-19 Vaccine (7 - 2023-24 season) 08/05/2022   Medicare Annual Wellness Visit  11/20/2023   DTaP/Tdap/Td vaccine (2 - Td or Tdap) 02/24/2029   Pneumonia Vaccine  Completed   Flu Shot  Completed   DEXA scan (bone density measurement)  Completed   HPV Vaccine  Aged Out    Next appointment: Follow up in one year for your annual wellness visit.   Preventive Care 4 Years and Older, Female Preventive care refers to lifestyle choices and visits with your health care provider that can promote health and wellness. What does preventive care include? A yearly physical exam. This is also called an annual well check. Dental exams once or twice a year. Routine eye exams. Ask your health care provider how often you should have your eyes checked. Personal lifestyle choices, including: Daily care of your teeth and gums. Regular physical activity. Eating a healthy diet. Avoiding tobacco and drug use. Limiting alcohol use. Practicing safe sex. Taking low-dose aspirin every day. Taking vitamin and mineral supplements as recommended by your health care provider. What happens during an annual well check? The services and screenings done by your health care provider during your annual well check will depend on your age, overall health, lifestyle risk factors, and family history of disease. Counseling  Your health care provider may ask you questions about your: Alcohol use. Tobacco use. Drug use. Emotional well-being. Home and relationship well-being. Sexual activity. Eating  habits. History of falls. Memory and ability to understand (cognition). Work and work Statistician. Reproductive health. Screening  You may have the following tests or measurements: Height, weight, and BMI. Blood pressure. Lipid and cholesterol levels. These may be checked every 5 years, or more frequently if you are over 18 years old. Skin check. Lung cancer screening. You may have this screening every year starting at age 37 if you have a 30-pack-year history of smoking and currently smoke or have quit within the past 15 years. Fecal occult blood test (FOBT) of the stool. You may have this test every year starting at age 67. Flexible sigmoidoscopy or colonoscopy. You may have a sigmoidoscopy every 5 years or a colonoscopy every 10 years starting at age 21. Hepatitis C blood test. Hepatitis B blood test. Sexually transmitted disease (STD) testing. Diabetes screening. This is done by checking your blood sugar (glucose) after you have not eaten for a while (fasting). You may have this done every 1-3 years. Bone density scan. This is done to screen for osteoporosis. You may have this done starting at age 61. Mammogram. This may be done every 1-2 years. Talk to your health care provider about how often you should have regular mammograms. Talk with your health care provider about your test results, treatment options, and if necessary, the need for more tests. Vaccines  Your health care provider may recommend certain vaccines, such as: Influenza vaccine. This is recommended every year. Tetanus, diphtheria, and acellular pertussis (Tdap, Td) vaccine. You may need a Td booster every 10 years. Zoster vaccine.  You may need this after age 41. Pneumococcal 13-valent conjugate (PCV13) vaccine. One dose is recommended after age 101. Pneumococcal polysaccharide (PPSV23) vaccine. One dose is recommended after age 95. Talk to your health care provider about which screenings and vaccines you need and how  often you need them. This information is not intended to replace advice given to you by your health care provider. Make sure you discuss any questions you have with your health care provider. Document Released: 09/16/2015 Document Revised: 05/09/2016 Document Reviewed: 06/21/2015 Elsevier Interactive Patient Education  2017 [place] Prevention in the Home Falls can cause injuries. They can happen to people of all ages. There are many things you can do to make your home safe and to help prevent falls. What can I do on the outside of my home? Regularly fix the edges of walkways and driveways and fix any cracks. Remove anything that might make you trip as you walk through a door, such as a raised step or threshold. Trim any bushes or trees on the path to your home. Use bright outdoor lighting. Clear any walking paths of anything that might make someone trip, such as rocks or tools. Regularly check to see if handrails are loose or broken. Make sure that both sides of any steps have handrails. Any raised decks and porches should have guardrails on the edges. Have any leaves, snow, or ice cleared regularly. Use sand or salt on walking paths during winter. Clean up any spills in your garage right away. This includes oil or grease spills. What can I do in the bathroom? Use night lights. Install grab bars by the toilet and in the tub and shower. Do not use towel bars as grab bars. Use non-skid mats or decals in the tub or shower. If you need to sit down in the shower, use a plastic, non-slip stool. Keep the floor dry. Clean up any water that spills on the floor as soon as it happens. Remove soap buildup in the tub or shower regularly. Attach bath mats securely with double-sided non-slip rug tape. Do not have throw rugs and other things on the floor that can make you trip. What can I do in the bedroom? Use night lights. Make sure that you have a light by your bed that is easy to  reach. Do not use any sheets or blankets that are too big for your bed. They should not hang down onto the floor. Have a firm chair that has side arms. You can use this for support while you get dressed. Do not have throw rugs and other things on the floor that can make you trip. What can I do in the kitchen? Clean up any spills right away. Avoid walking on wet floors. Keep items that you use a lot in easy-to-reach places. If you need to reach something above you, use a strong step stool that has a grab bar. Keep electrical cords out of the way. Do not use floor polish or wax that makes floors slippery. If you must use wax, use non-skid floor wax. Do not have throw rugs and other things on the floor that can make you trip. What can I do with my stairs? Do not leave any items on the stairs. Make sure that there are handrails on both sides of the stairs and use them. Fix handrails that are broken or loose. Make sure that handrails are as long as the stairways. Check any carpeting to make sure that it is  firmly attached to the stairs. Fix any carpet that is loose or worn. Avoid having throw rugs at the top or bottom of the stairs. If you do have throw rugs, attach them to the floor with carpet tape. Make sure that you have a light switch at the top of the stairs and the bottom of the stairs. If you do not have them, ask someone to add them for you. What else can I do to help prevent falls? Wear shoes that: Do not have high heels. Have rubber bottoms. Are comfortable and fit you well. Are closed at the toe. Do not wear sandals. If you use a stepladder: Make sure that it is fully opened. Do not climb a closed stepladder. Make sure that both sides of the stepladder are locked into place. Ask someone to hold it for you, if possible. Clearly mark and make sure that you can see: Any grab bars or handrails. First and last steps. Where the edge of each step is. Use tools that help you move  around (mobility aids) if they are needed. These include: Canes. Walkers. Scooters. Crutches. Turn on the lights when you go into a dark area. Replace any light bulbs as soon as they burn out. Set up your furniture so you have a clear path. Avoid moving your furniture around. If any of your floors are uneven, fix them. If there are any pets around you, be aware of where they are. Review your medicines with your doctor. Some medicines can make you feel dizzy. This can increase your chance of falling. Ask your doctor what other things that you can do to help prevent falls. This information is not intended to replace advice given to you by your health care provider. Make sure you discuss any questions you have with your health care provider. Document Released: 06/16/2009 Document Revised: 01/26/2016 Document Reviewed: 09/24/2014 Elsevier Interactive Patient Education  2017 [employer].

## 2022-12-16 NOTE — Progress Notes (Addendum)
[facility] [address], [address] [place], [place] [address] [phone]  Date:  12/19/2022   Name:  Wendy Hebert   DOB:  [birth year]   MRN:  [id number]  PCP:  Pearline Cables, MD    Chief Complaint: Annual Exam (Concerns/ questions: 1. Pt asked about reducing Eliquis. Ear flush, place frozen on face)   History of Present Illness:  Wendy Hebert is a 89 y.o. very pleasant female patient who presents with the following:  Patient seen today for physical  Most recent visit with myself was in January History of breast cancer, atrial fibrillation on Eliquis, lichen sclerosis, SSC on her leg with delayed healing after surgical resection She was seen in the ER at [facility] on 09/09/22-admitted overnight with a an apparent GI bleed At our visit in January her hemoglobin was improving, up to 11.7 Patient seen today for follow-up from recent hospitalization with upper GI bleed.  She was admitted overnight, treated with high-dose PPI as well as antibiotics for H. pylori.  Patient reports she was told to pause her Eliquis just for couple of days and then restart, she is taking it again now She has not seen any more unusual appearing stools, no further vomiting.  No abdominal pain.  She just does not feel great, feels kind of tired out.  We will certainly check her blood work as above.  Advised that some of her symptoms may be also due to metronidazole use, she is finishing this soon She will seek care right away should any of these GI bleed symptoms return I asked her to continue her PPI for at least 2 or 3 months Sheanna notes she is not sure about continuing to live alone, she has started looking into some assisted living facilities.  We discussed this together, this is probably a good idea as her only child, her son does not live nearby  Alprazolam as needed Eliquis 5 twice daily Vitamin D and calcium Diltiazem 180 Lopressor  twice daily Omega-3 Trazodone  Mammogram?- she is aware of this but does not wish to do right now  Can recommend Shingrix and updated COVID booster  Pt notes she is stable overall She notes a presumed left inguinal hernia which bothers her off and on- may hurt for a few days every couple of weeks.  She knows surgery may be difficult at her age, but also states that the pain can be severe at times.  We have not definitively found a hernia on imaging as of yet  She does not plan to move to assisted living unless "absolutely necessary" She is able to move around her home but cannot walk long distances She will use a cane or walker, she uses a power cart to shop, etc She drives, no concerns with her driving.    She notes she was told H pylori is cured per GI She is still taking pantoprazole until she finishes what she has on hand and then plans to stop using it  She notes she is having to get up every 2 hours at night to urinate; this has been an issue for maybe 2 months Could be a bit longer No dysuria She does not typically have urinary leakage except if she waits too long to visit the restroom  She noted an episode of feeling "a gushing" in her lower leg when she woke up from sleep. She is not sure what caused  this, it has not come back   She is bothered by a fungal left great toenail- she will contact her podiatrist    Patient Active Problem List   Diagnosis Date Noted   Osteopenia 08/25/2019   Malignant neoplasm of upper-inner quadrant of right breast in female, estrogen receptor positive 02/16/2019   Atrial fibrillation 10/01/2018   Gait difficulty 03/20/2018   Lichen sclerosus 03/20/2018    Past Medical History:  Diagnosis Date   Arthritis    Atrial fibrillation (HCC)    Cancer (HCC) 2017   1.6 lumpectomy   Depression    Hypertension    Lichen sclerosus     Past Surgical History:  Procedure Laterality Date   APPENDECTOMY     BOWEL RESECTION     BREAST LUMPECTOMY   2017   CARDIOVERSION N/A 11/10/2018   Procedure: CARDIOVERSION;  Surgeon: Chilton Si, MD;  Location: [facility] ENDOSCOPY;  Service: Cardiovascular;  Laterality: N/A;   CESAREAN SECTION     HERNIA REPAIR     LESION REMOVAL Right 04/24/2022   Procedure: WIDE LOCAL EXCISION RIGHT CALF SQUAMOUS CELL CANCER;  Surgeon: Almond Lint, MD;  Location: [facility] OR;  Service: General;  Laterality: Right;   TONSILLECTOMY      Social History   Tobacco Use   Smoking status: Former   Smokeless tobacco: Never   Tobacco comments:    30+ years  Vaping Use   Vaping Use: Never used  Substance Use Topics   Alcohol use: Not Currently    Comment: occasionally   Drug use: Never    Family History  Problem Relation Age of Onset   Cancer Father        Lung   Cancer Sister     No Known Allergies  Medication list has been reviewed and updated.  Current Outpatient Medications on File Prior to Visit  Medication Sig Dispense Refill   ALPRAZolam (XANAX) 0.5 MG tablet TAKE 1/2 TO 1 TABLET BY MOUTH  TWICE DAILY AS NEEDED FOR  ANXIETY 60 tablet 1   apixaban (ELIQUIS) 5 MG TABS tablet Take 1 tablet (5 mg total) by mouth 2 (two) times daily. 180 tablet 3   BIOTIN PO Take 500 mg by mouth daily.     Calcium Carb-Cholecalciferol (CALCIUM 600 + D PO) Take 1 tablet by mouth every evening.     Cholecalciferol (VITAMIN D3) 50 MCG (2000 UT) capsule Take 2,000 Units by mouth daily.     clobetasol cream (TEMOVATE) 0.05 % Apply 1 Application topically once a week.     clonazePAM (KLONOPIN) 0.5 MG tablet Take 0.5-1 tablets (0.25-0.5 mg total) by mouth at bedtime. Use as needed 30 tablet 1   cyanocobalamin (VITAMIN B12) 1000 MCG tablet Take 1,000 mcg by mouth daily.     diltiazem (CARDIZEM CD) 180 MG 24 hr capsule Take 180 mg by mouth daily.     HYDROcodone-acetaminophen (NORCO/VICODIN) 5-325 MG tablet Take 1 tablet by mouth every 6 (six) hours as needed. 20 tablet 0   losartan (COZAAR) 25 MG tablet Take 25 mg by mouth 2 (two)  times daily.     metoprolol tartrate (LOPRESSOR) 25 MG tablet TAKE 1 AND 1/2 TABLETS BY MOUTH  IN THE MORNING AND 1 TABLET BY  MOUTH IN THE EVENING 175 tablet 4   Multiple Vitamin (MULTIVITAMIN WITH MINERALS) TABS tablet Take 1 tablet by mouth every evening.     Multiple Vitamins-Minerals (PRESERVISION AREDS 2) CAPS Take 2 capsules by mouth at bedtime.  Omega 3 1200 MG CAPS Take 1,200 mg by mouth daily at 2 am.     pantoprazole (PROTONIX) 40 MG tablet Take 1 tablet (40 mg total) by mouth daily. 90 tablet 3   traZODone (DESYREL) 50 MG tablet Take 1-2 tablets (50-100 mg total) by mouth at bedtime as needed for sleep. 180 tablet 3   vitamin C (ASCORBIC ACID) 500 MG tablet Take 500 mg by mouth daily.     VITAMIN E PO Take 90 mg by mouth daily.     No current facility-administered medications on file prior to visit.    Review of Systems:  As per HPI- otherwise negative.   Physical Examination: Vitals:   12/19/22 1319  BP: 122/70  Pulse: 82  Resp: 18  Temp: 97.9 F (36.6 C)  SpO2: 99%   Vitals:   12/19/22 1319  Weight: 136 lb (61.7 kg)  Height: 5\' 1"  (1.549 m)   Body mass index is 25.7 kg/m. Ideal Body Weight: Weight in (lb) to have BMI = 25: 132  GEN: no acute distress.  Minimal overweight, looks well  HEENT: Atraumatic, Normocephalic.  Ears and Nose: No external deformity. CV: RRR, No M/G/R. No JVD. No thrill. No extra heart sounds. PULM: CTA B, no wheezes, crackles, rhonchi. No retractions. No resp. distress. No accessory muscle use. ABD: S, NT, ND, +BS. No rebound. No HSM. EXTR: No c/c/e PSYCH: Normally interactive. Conversant.  Thickening of her left great toenail consistent with onychomycosis Abdomen at this time is benign.  I am not able to definitively recognize an abdominal wall or inguinal hernia Area of squamous cell skin cancer resection on the back of her right calf is still healing.  She has scratched the area because it was itchy Assessment and  Plan: Physical exam  Atrial fibrillation, unspecified type  SCC (squamous cell carcinoma)  Upper GI bleed  Left lower quadrant abdominal pain - Plan: CT Abdomen Pelvis W Contrast, CBC, Comprehensive metabolic panel  Urinary frequency - Plan: Urine Culture   Physical exam today.  Encouraged healthy diet and exercise routine to the best of her ability Encouraged her to avoid scratching her leg which caused an excoriation.  Might try Benadryl cream as needed for itchiness Upper GI bleed is resolved, she was treated for H. pylori successfully She notes intermittent left lower quadrant pain.  We did get a CT scan about 18 months ago which showed enteritis, diverticular disease without diverticulitis.  She wonders if there may be a hernia.  She notes that I commented her abdominal contour seem more prominent on the left previously.  I am not able to appreciate a definite abnormality today In any case, we will obtain blood work as above and then repeat a CT abdomen pelvis to look for any explanation for her discomfort Urine culture pending due to urinary frequency She does not wish to start any medication for overactive bladder at this time  Signed Abbe Amsterdam, MD  Addendum 4/18, received labs as below.  Message to patient  Results for orders placed or performed in visit on 12/19/22  Urine Culture   Specimen: Urine  Result Value Ref Range   MICRO NUMBER: [id number]    SPECIMEN QUALITY: Adequate    Sample Source URINE    STATUS: FINAL    ISOLATE 1: Proteus mirabilis (A)       Susceptibility   Proteus mirabilis - URINE CULTURE, REFLEX    AMOX/CLAVULANIC 4 Sensitive     AMPICILLIN <=2 Sensitive  AMPICILLIN/SULBACTAM <=2 Sensitive     CEFAZOLIN* 8 Resistant      * For uncomplicated UTI caused by E. coli, K. pneumoniae or P. mirabilis: Cefazolin is susceptible if MIC <32 mcg/mL and predicts susceptible to the oral agents cefaclor, cefdinir, cefpodoxime, cefprozil, cefuroxime,  cephalexin and loracarbef.     CEFTAZIDIME <=1 Sensitive     CEFEPIME <=1 Sensitive     CEFTRIAXONE <=1 Sensitive     CIPROFLOXACIN <=0.25 Sensitive     LEVOFLOXACIN <=0.12 Sensitive     GENTAMICIN <=1 Sensitive     IMIPENEM 8 Resistant     NITROFURANTOIN 128 Resistant     PIP/TAZO <=4 Sensitive     TOBRAMYCIN <=1 Sensitive     TRIMETH/SULFA* <=20 Sensitive      * For uncomplicated UTI caused by E. coli, K. pneumoniae or P. mirabilis: Cefazolin is susceptible if MIC <32 mcg/mL and predicts susceptible to the oral agents cefaclor, cefdinir, cefpodoxime, cefprozil, cefuroxime, cephalexin and loracarbef. Legend: S = Susceptible  I = Intermediate R = Resistant  NS = Not susceptible * = Not tested  NR = Not reported **NN = See antimicrobic comments   CBC  Result Value Ref Range   WBC 12.6 (H) 4.0 - 10.5 K/uL   RBC 4.68 3.87 - 5.11 Mil/uL   Platelets 332.0 150.0 - 400.0 K/uL   Hemoglobin 13.7 12.0 - 15.0 g/dL   HCT 54.0 98.1 - 19.1 %   MCV 88.6 78.0 - 100.0 fl   MCHC 33.1 30.0 - 36.0 g/dL   RDW 47.8 (H) 29.5 - 62.1 %  Comprehensive metabolic panel  Result Value Ref Range   Sodium 137 135 - 145 mEq/L   Potassium 4.3 3.5 - 5.1 mEq/L   Chloride 102 96 - 112 mEq/L   CO2 25 19 - 32 mEq/L   Glucose, Bld 110 (H) 70 - 99 mg/dL   BUN 25 (H) 6 - 23 mg/dL   Creatinine, Ser 3.08 (H) 0.40 - 1.20 mg/dL   Total Bilirubin 0.6 0.2 - 1.2 mg/dL   Alkaline Phosphatase 94 39 - 117 U/L   AST 21 0 - 37 U/L   ALT 16 0 - 35 U/L   Total Protein 6.8 6.0 - 8.3 g/dL   Albumin 4.1 3.5 - 5.2 g/dL   GFR 65.78 (L) >46.96 mL/min   Calcium 9.1 8.4 - 10.5 mg/dL   Addnd 2/95 Received urine culture- will treat with septra, message to pt

## 2022-12-16 NOTE — Patient Instructions (Incomplete)
It was good to see you again today, recommend shingles series and COVID booster at your pharmacy I would suggest getting a mammogram at your convenience  We will set up a CT scan of your abdomen and pelvis to check out this pain in the left lower belly  Please contact your foot doctor at your convenience to check on your toenail- they may be able to remove it for you  Try a topical benadryl cream for the itchy area on your leg

## 2022-12-19 ENCOUNTER — Ambulatory Visit (INDEPENDENT_AMBULATORY_CARE_PROVIDER_SITE_OTHER): Payer: Medicare Other | Admitting: Family Medicine

## 2022-12-19 VITALS — BP 122/70 | HR 82 | Temp 97.9°F | Resp 18 | Ht 61.0 in | Wt 136.0 lb

## 2022-12-19 DIAGNOSIS — I4891 Unspecified atrial fibrillation: Secondary | ICD-10-CM

## 2022-12-19 DIAGNOSIS — R350 Frequency of micturition: Secondary | ICD-10-CM

## 2022-12-19 DIAGNOSIS — K922 Gastrointestinal hemorrhage, unspecified: Secondary | ICD-10-CM

## 2022-12-19 DIAGNOSIS — Z0001 Encounter for general adult medical examination with abnormal findings: Secondary | ICD-10-CM

## 2022-12-19 DIAGNOSIS — C4492 Squamous cell carcinoma of skin, unspecified: Secondary | ICD-10-CM | POA: Diagnosis not present

## 2022-12-19 DIAGNOSIS — Z0000 Encounter for general adult medical examination without abnormal findings: Secondary | ICD-10-CM

## 2022-12-19 DIAGNOSIS — R1032 Left lower quadrant pain: Secondary | ICD-10-CM | POA: Diagnosis not present

## 2022-12-20 ENCOUNTER — Encounter: Payer: Self-pay | Admitting: Family Medicine

## 2022-12-20 LAB — COMPREHENSIVE METABOLIC PANEL
ALT: 16 U/L (ref 0–35)
AST: 21 U/L (ref 0–37)
Albumin: 4.1 g/dL (ref 3.5–5.2)
Alkaline Phosphatase: 94 U/L (ref 39–117)
BUN: 25 mg/dL — ABNORMAL HIGH (ref 6–23)
CO2: 25 mEq/L (ref 19–32)
Calcium: 9.1 mg/dL (ref 8.4–10.5)
Chloride: 102 mEq/L (ref 96–112)
Creatinine, Ser: 1.26 mg/dL — ABNORMAL HIGH (ref 0.40–1.20)
GFR: 38.35 mL/min — ABNORMAL LOW (ref >60.00)
Glucose, Bld: 110 mg/dL — ABNORMAL HIGH (ref 70–99)
Potassium: 4.3 mEq/L (ref 3.5–5.1)
Sodium: 137 mEq/L (ref 135–145)
Total Bilirubin: 0.6 mg/dL (ref 0.2–1.2)
Total Protein: 6.8 g/dL (ref 6.0–8.3)

## 2022-12-20 LAB — CBC
HCT: 41.4 % (ref 36.0–46.0)
Hemoglobin: 13.7 g/dL (ref 12.0–15.0)
MCHC: 33.1 g/dL (ref 30.0–36.0)
MCV: 88.6 fl (ref 78.0–100.0)
Platelets: 332 10*3/uL (ref 150.0–400.0)
RBC: 4.68 Mil/uL (ref 3.87–5.11)
RDW: 17.7 % — ABNORMAL HIGH (ref 11.5–15.5)
WBC: 12.6 10*3/uL — ABNORMAL HIGH (ref 4.0–10.5)

## 2022-12-20 NOTE — Addendum Note (Signed)
Addended by: Abbe Amsterdam C on: 12/20/2022 12:48 PM   Modules accepted: Level of Service

## 2022-12-21 ENCOUNTER — Ambulatory Visit (HOSPITAL_BASED_OUTPATIENT_CLINIC_OR_DEPARTMENT_OTHER)
Admission: RE | Admit: 2022-12-21 | Discharge: 2022-12-21 | Disposition: A | Discharge status: Home / Self Care | Payer: Medicare Other | Source: Ambulatory Visit | Attending: Family Medicine | Admitting: Family Medicine

## 2022-12-21 ENCOUNTER — Encounter: Payer: Self-pay | Admitting: Family Medicine

## 2022-12-21 DIAGNOSIS — R1032 Left lower quadrant pain: Secondary | ICD-10-CM

## 2022-12-21 DIAGNOSIS — K5730 Diverticulosis of large intestine without perforation or abscess without bleeding: Secondary | ICD-10-CM | POA: Diagnosis not present

## 2022-12-21 DIAGNOSIS — N8500 Endometrial hyperplasia, unspecified: Secondary | ICD-10-CM

## 2022-12-21 LAB — URINE CULTURE
MICRO NUMBER:: 14837298
SPECIMEN QUALITY:: ADEQUATE

## 2022-12-21 MED ORDER — IOHEXOL 300 MG/ML  SOLN
80.0000 mL | Freq: Once | INTRAMUSCULAR | Status: AC | PRN
  Administered 2022-12-21: 80 mL via INTRAVENOUS

## 2022-12-21 MED ORDER — CEPHALEXIN 500 MG PO CAPS: 500.0000 mg | ORAL_CAPSULE | Freq: Two times a day (BID) | ORAL | 0 refills | Status: DC

## 2022-12-21 MED ORDER — SULFAMETHOXAZOLE-TRIMETHOPRIM 800-160 MG PO TABS: 1.0000 | ORAL_TABLET | Freq: Two times a day (BID) | ORAL | 0 refills | Status: DC

## 2022-12-21 NOTE — Addendum Note (Signed)
Addended by: Abbe Amsterdam C on: 12/21/2022 08:03 PM   Modules accepted: Orders

## 2022-12-22 ENCOUNTER — Ambulatory Visit (HOSPITAL_BASED_OUTPATIENT_CLINIC_OR_DEPARTMENT_OTHER): Payer: Medicare Other

## 2022-12-24 ENCOUNTER — Encounter: Payer: Self-pay | Admitting: Family Medicine

## 2022-12-24 NOTE — Addendum Note (Signed)
Addended by: Pearline Cables on: 12/24/2022 04:49 PM   Modules accepted: Orders

## 2022-12-26 DIAGNOSIS — H43813 Vitreous degeneration, bilateral: Secondary | ICD-10-CM | POA: Diagnosis not present

## 2022-12-26 DIAGNOSIS — H353112 Nonexudative age-related macular degeneration, right eye, intermediate dry stage: Secondary | ICD-10-CM | POA: Diagnosis not present

## 2022-12-26 DIAGNOSIS — H353221 Exudative age-related macular degeneration, left eye, with active choroidal neovascularization: Secondary | ICD-10-CM | POA: Diagnosis not present

## 2022-12-26 DIAGNOSIS — H26493 Other secondary cataract, bilateral: Secondary | ICD-10-CM | POA: Diagnosis not present

## 2022-12-27 DIAGNOSIS — H10012 Acute follicular conjunctivitis, left eye: Secondary | ICD-10-CM | POA: Diagnosis not present

## 2023-01-03 DIAGNOSIS — H353221 Exudative age-related macular degeneration, left eye, with active choroidal neovascularization: Secondary | ICD-10-CM | POA: Diagnosis not present

## 2023-01-03 DIAGNOSIS — H26492 Other secondary cataract, left eye: Secondary | ICD-10-CM | POA: Diagnosis not present

## 2023-01-03 DIAGNOSIS — H35363 Drusen (degenerative) of macula, bilateral: Secondary | ICD-10-CM | POA: Diagnosis not present

## 2023-01-03 DIAGNOSIS — Z961 Presence of intraocular lens: Secondary | ICD-10-CM | POA: Diagnosis not present

## 2023-01-03 DIAGNOSIS — H35453 Secondary pigmentary degeneration, bilateral: Secondary | ICD-10-CM | POA: Diagnosis not present

## 2023-01-03 DIAGNOSIS — H53412 Scotoma involving central area, left eye: Secondary | ICD-10-CM | POA: Diagnosis not present

## 2023-01-03 DIAGNOSIS — H353111 Nonexudative age-related macular degeneration, right eye, early dry stage: Secondary | ICD-10-CM | POA: Diagnosis not present

## 2023-01-07 DIAGNOSIS — H353221 Exudative age-related macular degeneration, left eye, with active choroidal neovascularization: Secondary | ICD-10-CM | POA: Diagnosis not present

## 2023-01-18 ENCOUNTER — Encounter: Payer: Self-pay | Admitting: Family Medicine

## 2023-01-31 DIAGNOSIS — L820 Inflamed seborrheic keratosis: Secondary | ICD-10-CM | POA: Diagnosis not present

## 2023-02-06 DIAGNOSIS — H353221 Exudative age-related macular degeneration, left eye, with active choroidal neovascularization: Secondary | ICD-10-CM | POA: Diagnosis not present

## 2023-02-12 DIAGNOSIS — Z1231 Encounter for screening mammogram for malignant neoplasm of breast: Secondary | ICD-10-CM | POA: Diagnosis not present

## 2023-02-12 LAB — HM MAMMOGRAPHY

## 2023-02-12 NOTE — Progress Notes (Unsigned)
[name] [address] [place], [place] [address] [phone]  Date:  02/13/2023   Name:  Wendy Hebert   DOB:  Jul 01, 1933   MRN:  [id number]  PCP:  Pearline Cables, MD    Chief Complaint: Questions/concerns (1. Pt would like to discuss what she though was a hernia and is not but is painful. 2. Derm froze a place on the L thigh and she thinks it needs more. /(Holding further mammograms per previous conversations) )   History of Present Illness:  Wendy Hebert is a 89 y.o. very pleasant female patient who presents with the following:  Patient seen today for follow-up visit Most recently seen by myself about 6 weeks ago for her physical History of breast cancer, atrial fibrillation on Eliquis, lichen sclerosis, SSC on her leg with delayed healing after surgical resection  She had a GI bleed this past January and was admitted overnight at [facility] Notes from our most recent visit on 12/19/2022: Physical exam today.  Encouraged healthy diet and exercise routine to the best of her ability Encouraged her to avoid scratching her leg which caused an excoriation.  Might try Benadryl cream as needed for itchiness Upper GI bleed is resolved, she was treated for H. pylori successfully She notes intermittent left lower quadrant pain.  We did get a CT scan about 18 months ago which showed enteritis, diverticular disease without diverticulitis.  She wonders if there may be a hernia.  She notes that I commented her abdominal contour seem more prominent on the left previously.  I am not able to appreciate a definite abnormality today In any case, we will obtain blood work as above and then repeat a CT abdomen pelvis to look for any explanation for her discomfort Urine culture pending due to urinary frequency She does not wish to start any medication for overactive bladder at this time  CT abdomen pelvis 12/21/2022 1. No  acute intra-abdominal or pelvic pathology. 2. Sigmoid diverticulosis. No bowel obstruction. 3. Thickened appearance of the endometrium. Further evaluation with ultrasound recommended. 4.  Aortic Atherosclerosis (ICD10-I70.0).  She previously chose to defer pelvic ultrasound as she was recently diagnosed with macular degeneration-this is being treated by her ophthalmologist Today she notes she is generally feeling pretty well-the left lower abdominal pain that was bothering her previously seems to have settled down However Sunday she noted a left lower back pain which radiated down her left leg, but this has resolved since that time-she wonders if this may be the "hernia pain" moving to a new location.  I advised her that at this time we have fairly thoroughly worked up any issues with her abdomen except for evaluating the endometrial hyperplasia.  She feels up to moving forward with the pelvic ultrasound at this time.  I will go ahead and order this for her  She notes her dermatologist froze a place on her left posterior knee about a week ago and would like to have this frozen again     Patient Active Problem List   Diagnosis Date Noted   Osteopenia 08/25/2019   Malignant neoplasm of upper-inner quadrant of right breast in female, estrogen receptor positive (HCC) 02/16/2019   Atrial fibrillation (HCC) 10/01/2018   Gait difficulty 03/20/2018   Lichen sclerosus 03/20/2018    Past Medical History:  Diagnosis Date   Arthritis    Atrial fibrillation (HCC)    Cancer (HCC)  2017   1.6 lumpectomy   Depression    Hypertension    Lichen sclerosus     Past Surgical History:  Procedure Laterality Date   APPENDECTOMY     BOWEL RESECTION     BREAST LUMPECTOMY  2017   CARDIOVERSION N/A 11/10/2018   Procedure: CARDIOVERSION;  Surgeon: Chilton Si, MD;  Location: [facility] ENDOSCOPY;  Service: Cardiovascular;  Laterality: N/A;   CESAREAN SECTION     HERNIA REPAIR     LESION REMOVAL Right  04/24/2022   Procedure: WIDE LOCAL EXCISION RIGHT CALF SQUAMOUS CELL CANCER;  Surgeon: Almond Lint, MD;  Location: [facility] OR;  Service: General;  Laterality: Right;   TONSILLECTOMY      Social History   Tobacco Use   Smoking status: Former   Smokeless tobacco: Never   Tobacco comments:    30+ years  Vaping Use   Vaping Use: Never used  Substance Use Topics   Alcohol use: Not Currently    Comment: occasionally   Drug use: Never    Family History  Problem Relation Age of Onset   Cancer Father        Lung   Cancer Sister     No Known Allergies  Medication list has been reviewed and updated.  Current Outpatient Medications on File Prior to Visit  Medication Sig Dispense Refill   ALPRAZolam (XANAX) 0.5 MG tablet TAKE 1/2 TO 1 TABLET BY MOUTH  TWICE DAILY AS NEEDED FOR  ANXIETY 60 tablet 1   apixaban (ELIQUIS) 5 MG TABS tablet Take 1 tablet (5 mg total) by mouth 2 (two) times daily. 180 tablet 3   BIOTIN PO Take 500 mg by mouth daily.     Calcium Carb-Cholecalciferol (CALCIUM 600 + D PO) Take 1 tablet by mouth every evening.     Cholecalciferol (VITAMIN D3) 50 MCG (2000 UT) capsule Take 2,000 Units by mouth daily.     clobetasol cream (TEMOVATE) 0.05 % Apply 1 Application topically once a week.     cyanocobalamin (VITAMIN B12) 1000 MCG tablet Take 1,000 mcg by mouth daily.     diltiazem (CARDIZEM CD) 180 MG 24 hr capsule Take 180 mg by mouth daily.     losartan (COZAAR) 25 MG tablet Take 25 mg by mouth 2 (two) times daily.     metoprolol tartrate (LOPRESSOR) 25 MG tablet TAKE 1 AND 1/2 TABLETS BY MOUTH  IN THE MORNING AND 1 TABLET BY  MOUTH IN THE EVENING 175 tablet 4   Multiple Vitamin (MULTIVITAMIN WITH MINERALS) TABS tablet Take 1 tablet by mouth every evening.     Multiple Vitamins-Minerals (PRESERVISION AREDS 2) CAPS Take 2 capsules by mouth at bedtime.     Omega 3 1200 MG CAPS Take 1,200 mg by mouth daily at 2 am.     vitamin C (ASCORBIC ACID) 500 MG tablet Take 500 mg by  mouth daily.     VITAMIN E PO Take 90 mg by mouth daily.     No current facility-administered medications on file prior to visit.    Review of Systems:  As per HPI- otherwise negative.   Physical Examination: Vitals:   02/13/23 1356  BP: 120/64  Pulse: 68  Resp: 18  Temp: 97.6 F (36.4 C)  SpO2: 97%   Vitals:   02/13/23 1356  Weight: 138 lb (62.6 kg)  Height: 5\' 1"  (1.549 m)   Body mass index is 26.07 kg/m. Ideal Body Weight: Weight in (lb) to have BMI = 25: 132  GEN: no acute distress.  Elderly lady, looks well.  Seated in wheelchair HEENT: Atraumatic, Normocephalic.  Ears and Nose: No external deformity. CV: RRR, No M/G/R. No JVD. No thrill. No extra heart sounds. PULM: CTA B, no wheezes, crackles, rhonchi. No retractions. No resp. distress. No accessory muscle use. ABD: S, NT, ND, +BS. No rebound. No HSM.  Abdominal exam is benign today EXTR: No c/c/e PSYCH: Normally interactive. Conversant.   Patient points out to spot she would like frozen, 1 on her central forehead and the other on the back of her right knee which was previously treated by dermatology.  Verbal consent obtained.  Cryotherapy to both these areas x 3 cycles each Assessment and Plan: Endometrial hyperplasia - Plan: US Pelvic Complete With Transvaginal  Leukocytosis, unspecified type - Plan: CBC  Renal insufficiency - Plan: Basic metabolic panel  Patient seen today with concern of need to follow-up endometrial hyperplasia seen on CT scan.  Order pelvic ultrasound today, we discussed the transvaginal component.  Certainly given her age this may not be possible for her.  She is advised that she can  decline this part of the test  Follow-up in leukocytosis with CBC Follow-up on mild renal insufficiency with BMP  Signed Abbe Amsterdam, MD Received labs as below, message to patient Renal function shows long-term stability  however, leukocytosis is persistent Results for orders placed or performed  in visit on 02/13/23  Basic metabolic panel  Result Value Ref Range   Sodium 140 135 - 145 mEq/L   Potassium 4.2 3.5 - 5.1 mEq/L   Chloride 104 96 - 112 mEq/L   CO2 25 19 - 32 mEq/L   Glucose, Bld 146 (H) 70 - 99 mg/dL   BUN 27 (H) 6 - 23 mg/dL   Creatinine, Ser 1.61 0.40 - 1.20 mg/dL   GFR 09.60 (L) >45.40 mL/min   Calcium 8.9 8.4 - 10.5 mg/dL  CBC  Result Value Ref Range   WBC 13.1 (H) 4.0 - 10.5 K/uL   RBC 4.70 3.87 - 5.11 Mil/uL   Platelets 303.0 150.0 - 400.0 K/uL   Hemoglobin 14.1 12.0 - 15.0 g/dL   HCT 98.1 19.1 - 47.8 %   MCV 92.2 78.0 - 100.0 fl   MCHC 32.6 30.0 - 36.0 g/dL   RDW 29.5 (H) 62.1 - 30.8 %     .this

## 2023-02-13 ENCOUNTER — Ambulatory Visit (INDEPENDENT_AMBULATORY_CARE_PROVIDER_SITE_OTHER): Payer: Medicare Other | Admitting: Family Medicine

## 2023-02-13 VITALS — BP 120/64 | HR 68 | Temp 97.6°F | Resp 18 | Ht 61.0 in | Wt 138.0 lb

## 2023-02-13 DIAGNOSIS — D72829 Elevated white blood cell count, unspecified: Secondary | ICD-10-CM | POA: Diagnosis not present

## 2023-02-13 DIAGNOSIS — N8500 Endometrial hyperplasia, unspecified: Secondary | ICD-10-CM | POA: Diagnosis not present

## 2023-02-13 DIAGNOSIS — N289 Disorder of kidney and ureter, unspecified: Secondary | ICD-10-CM | POA: Diagnosis not present

## 2023-02-13 NOTE — Patient Instructions (Addendum)
It was good to see you today- let me know what else you might need  I ordered your pelvic ultrasound - please schedule asap   [address], [place], [place] [address]  [phone]  I will be in touch with your labs asap Assuming all is well let's visit in 4-6 months

## 2023-02-14 ENCOUNTER — Encounter: Payer: Self-pay | Admitting: Family Medicine

## 2023-02-14 DIAGNOSIS — N8500 Endometrial hyperplasia, unspecified: Secondary | ICD-10-CM

## 2023-02-14 LAB — BASIC METABOLIC PANEL
BUN: 27 mg/dL — ABNORMAL HIGH (ref 6–23)
CO2: 25 mEq/L (ref 19–32)
Calcium: 8.9 mg/dL (ref 8.4–10.5)
Chloride: 104 mEq/L (ref 96–112)
Creatinine, Ser: 1.06 mg/dL (ref 0.40–1.20)
GFR: 47.14 mL/min — ABNORMAL LOW (ref >60.00)
Glucose, Bld: 146 mg/dL — ABNORMAL HIGH (ref 70–99)
Potassium: 4.2 mEq/L (ref 3.5–5.1)
Sodium: 140 mEq/L (ref 135–145)

## 2023-02-14 LAB — CBC
HCT: 43.4 % (ref 36.0–46.0)
Hemoglobin: 14.1 g/dL (ref 12.0–15.0)
MCHC: 32.6 g/dL (ref 30.0–36.0)
MCV: 92.2 fl (ref 78.0–100.0)
Platelets: 303 10*3/uL (ref 150.0–400.0)
RBC: 4.7 Mil/uL (ref 3.87–5.11)
RDW: 17.2 % — ABNORMAL HIGH (ref 11.5–15.5)
WBC: 13.1 10*3/uL — ABNORMAL HIGH (ref 4.0–10.5)

## 2023-02-27 NOTE — Addendum Note (Signed)
Addended by: Pearline Cables on: 02/27/2023 09:23 PM   Modules accepted: Orders

## 2023-03-04 ENCOUNTER — Other Ambulatory Visit (HOSPITAL_COMMUNITY): Payer: Self-pay | Admitting: Family Medicine

## 2023-03-04 DIAGNOSIS — I4891 Unspecified atrial fibrillation: Secondary | ICD-10-CM

## 2023-03-13 ENCOUNTER — Encounter: Payer: Self-pay | Admitting: Family Medicine

## 2023-03-13 DIAGNOSIS — H353221 Exudative age-related macular degeneration, left eye, with active choroidal neovascularization: Secondary | ICD-10-CM | POA: Diagnosis not present

## 2023-03-14 NOTE — Telephone Encounter (Signed)
I called Dr Gus Puma office. Pt is scheduled for 04/17/23 at 10:00 am. This is the earliest they can see her.

## 2023-03-24 NOTE — Progress Notes (Unsigned)
[facility] [address] [place], [place] [address] [phone]  Date:  03/28/2023   Name:  Wendy Hebert   DOB:  October 26, 1932   MRN:  [id number]  PCP:  Pearline Cables, MD    Chief Complaint: No chief complaint on file.   History of Present Illness:  Wendy Hebert is a 89 y.o. 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  CT abdomen pelvis in April was normal except for endometrial thickening We did move forward with a pelvic ultrasound which was completed at [facility] last month  1. Thickened, mildly heterogeneous endometrium. Endometrial thickness is considered abnormal for an asymptomatic post-menopausal female. Endometrial sampling should be considered to exclude carcinoma. 2. Nonvisualized ovaries.   She does have a GYN appointment coming up in mid August  She contacted me recently due to persistent abdominal pain, we made this appointment for recheck  CMP completed in April and normal except for renal insufficiency, repeat lab work in June showed mild leukocytosis Patient Active Problem List   Diagnosis Date Noted   Osteopenia 08/25/2019   Malignant neoplasm of upper-inner quadrant of right breast in female, estrogen receptor positive (HCC) 02/16/2019   Atrial fibrillation (HCC) 10/01/2018   Gait difficulty 03/20/2018   Lichen sclerosus 03/20/2018    Past Medical History:  Diagnosis Date   Arthritis    Atrial fibrillation (HCC)    Cancer (HCC) 2017   1.6 lumpectomy   Depression     Hypertension    Lichen sclerosus     Past Surgical History:  Procedure Laterality Date   APPENDECTOMY     BOWEL RESECTION     BREAST LUMPECTOMY  2017   CARDIOVERSION N/A 11/10/2018   Procedure: CARDIOVERSION;  Surgeon: Chilton Si, MD;  Location: [facility] ENDOSCOPY;  Service: Cardiovascular;  Laterality: N/A;   CESAREAN SECTION     HERNIA REPAIR     LESION REMOVAL Right 04/24/2022   Procedure: WIDE LOCAL EXCISION RIGHT CALF SQUAMOUS CELL CANCER;  Surgeon: Almond Lint, MD;  Location: [facility] OR;  Service: General;  Laterality: Right;   TONSILLECTOMY      Social History   Tobacco Use   Smoking status: Former   Smokeless tobacco: Never   Tobacco comments:    30+ years  Vaping Use   Vaping status: Never Used  Substance Use Topics   Alcohol use: Not Currently    Comment: occasionally   Drug use: Never    Family History  Problem Relation Age of Onset   Cancer Father        Lung   Cancer Sister     No Known Allergies  Medication list has been reviewed and updated.  Current Outpatient Medications on File Prior to Visit  Medication Sig Dispense Refill   ALPRAZolam (XANAX) 0.5 MG tablet TAKE 1/2 TO 1 TABLET BY MOUTH  TWICE DAILY AS NEEDED FOR  ANXIETY 60 tablet 1   apixaban (ELIQUIS) 5 MG TABS tablet Take 1 tablet (5 mg total) by mouth 2 (two) times  daily. 180 tablet 1   BIOTIN PO Take 500 mg by mouth daily.     Calcium Carb-Cholecalciferol (CALCIUM 600 + D PO) Take 1 tablet by mouth every evening.     Cholecalciferol (VITAMIN D3) 50 MCG (2000 UT) capsule Take 2,000 Units by mouth daily.     clobetasol cream (TEMOVATE) 0.05 % Apply 1 Application topically once a week.     cyanocobalamin (VITAMIN B12) 1000 MCG tablet Take 1,000 mcg by mouth daily.     diltiazem (CARDIZEM CD) 180 MG 24 hr capsule Take 180 mg by mouth daily.     losartan (COZAAR) 25 MG tablet Take 25 mg by mouth 2 (two) times daily.     metoprolol tartrate (LOPRESSOR) 25 MG tablet TAKE 1 AND 1/2 TABLETS BY MOUTH   IN THE MORNING AND 1 TABLET BY  MOUTH IN THE EVENING 175 tablet 4   Multiple Vitamin (MULTIVITAMIN WITH MINERALS) TABS tablet Take 1 tablet by mouth every evening.     Multiple Vitamins-Minerals (PRESERVISION AREDS 2) CAPS Take 2 capsules by mouth at bedtime.     Omega 3 1200 MG CAPS Take 1,200 mg by mouth daily at 2 am.     vitamin C (ASCORBIC ACID) 500 MG tablet Take 500 mg by mouth daily.     VITAMIN E PO Take 90 mg by mouth daily.     No current facility-administered medications on file prior to visit.    Review of Systems:  As per HPI- otherwise negative.   Physical Examination: There were no vitals filed for this visit. There were no vitals filed for this visit. There is no height or weight on file to calculate BMI. Ideal Body Weight:    GEN: no acute distress. HEENT: Atraumatic, Normocephalic.  Ears and Nose: No external deformity. CV: RRR, No M/G/R. No JVD. No thrill. No extra heart sounds. PULM: CTA B, no wheezes, crackles, rhonchi. No retractions. No resp. distress. No accessory muscle use. ABD: S, NT, ND, +BS. No rebound. No HSM. EXTR: No c/c/e PSYCH: Normally interactive. Conversant.    Assessment and Plan: ***  Signed Abbe Amsterdam, MD

## 2023-03-28 ENCOUNTER — Ambulatory Visit (INDEPENDENT_AMBULATORY_CARE_PROVIDER_SITE_OTHER): Payer: Medicare Other | Admitting: Family Medicine

## 2023-03-28 VITALS — BP 122/80 | HR 67 | Temp 97.8°F | Resp 18

## 2023-03-28 DIAGNOSIS — R7309 Other abnormal glucose: Secondary | ICD-10-CM

## 2023-03-28 DIAGNOSIS — H6121 Impacted cerumen, right ear: Secondary | ICD-10-CM

## 2023-03-28 DIAGNOSIS — R1084 Generalized abdominal pain: Secondary | ICD-10-CM | POA: Diagnosis not present

## 2023-03-28 DIAGNOSIS — G8929 Other chronic pain: Secondary | ICD-10-CM | POA: Diagnosis not present

## 2023-03-28 NOTE — Patient Instructions (Signed)
It ws good to see you again today- I will be in touch with your labs,  let's get a little bloodwork today if ok with you  I will watch for report from the gynecologist Please let me know if any changes or worsening as far as your abdominal pain

## 2023-04-08 ENCOUNTER — Ambulatory Visit: Payer: Medicare Other | Admitting: Licensed Clinical Social Worker

## 2023-04-08 NOTE — Patient Instructions (Signed)
Social Work Visit Information  Thank you for taking time to visit with me today. Please don't hesitate to contact me if I can be of assistance to you.   Following are the goals we discussed today:   Goals Addressed             This Visit's Progress    COMPLETED: provide copy of advance directive       Activities and task to complete in order to accomplish goals.   Bring a copy of your Advance Directive and Health Care POA to the office to be scanned into your chart or upload via mychart Keep all upcoming appointment discussed today Continue with compliance of taking medication prescribed by Doctor         Patient does not desire continued follow-up by social work. They will contact the office if needed  Please call the care guide team at [phone] if you need to cancel or reschedule your appointment.   If you or anyone you know are experiencing a Mental Health or Behavioral Health Crisis or need someone to talk to, please call the Suicide and Crisis Lifeline: 988 call the [place] National Suicide Prevention Lifeline: 873-697-2193 or TTY: 229-320-5294 TTY 684-415-7989) to talk to a trained counselor call 1-800-273-TALK (toll free, 24 hour hotline) go to [facility] [address], [place] [phone])   Patient verbalizes understanding of instructions and care plan provided today and agrees to view in MyChart. Active MyChart status and patient understanding of how to access instructions and care plan via MyChart confirmed with patient.       Sammuel Hines, LCSW Social Work Care Coordination  [facility] [employer] [phone]

## 2023-04-08 NOTE — Patient Outreach (Signed)
  Care Coordination  Initial Visit Note   04/08/2023 Name: Wendy Hebert MRN: [id number] DOB: [birth year]  Wendy Hebert is a 89 y.o. year old female who sees Copland, Wendy Found, MD for primary care. I spoke with  Wendy Hebert by phone today.  What matters to the patients health and wellness today?    Assessed patient's needs, support system and barriers to care.  Patient reports no concerns or needs from Care Coordination team with health and wellness related to physical or mental heath. .     Goals Addressed             This Visit's Progress    COMPLETED: provide copy of advance directive       Activities and task to complete in order to accomplish goals.   Bring a copy of your Advance Directive and Health Care POA to the office to be scanned into your chart or upload via mychart Keep all upcoming appointment discussed today Continue with compliance of taking medication prescribed by Doctor        SDOH assessments and interventions completed:  recently completed patient reports no needs or changes No   Care Coordination Interventions:  Yes, provided  Interventions Today    Flowsheet Row Most Recent Value  General Interventions   General Interventions Discussed/Reviewed General Interventions Discussed, Level of Care  [Reviewed Care Coordination Services]  Level of Care --  [has someone that cooks for her visits weekly]  Education Interventions   Education Provided Provided Education  Mental Health Interventions   Mental Health Discussed/Reviewed Mental Health Discussed  [has a therapist to connect to when needed,  denies a need at this time]  Nutrition Interventions   Nutrition Discussed/Reviewed Nutrition Discussed  Pharmacy Interventions   Pharmacy Dicussed/Reviewed Pharmacy Topics Discussed  Advanced Directive Interventions   Advanced Directives Discussed/Reviewed Advanced Directives Discussed  Wendy Hebert documents will provide a copy for office/ per patient her son  Wendy Hebert is her HPOA]       Follow up plan: No further intervention required.   Encounter Outcome:  Pt. Visit Completed   Sammuel Hines, LCSW Social Work Care Coordination  [facility] Wendy Hebert [phone]

## 2023-04-17 DIAGNOSIS — R9389 Abnormal findings on diagnostic imaging of other specified body structures: Secondary | ICD-10-CM | POA: Diagnosis not present

## 2023-04-19 ENCOUNTER — Telehealth: Payer: Self-pay | Admitting: Family Medicine

## 2023-04-19 NOTE — Telephone Encounter (Signed)
Pt wanted to inform Dr. Patsy Lager that she saw her gynecologist Wednesday & they referred her to a surgeon. She requested for Dr. Patsy Lager to give her a call to discuss more details. Please advise pt.

## 2023-04-21 NOTE — Telephone Encounter (Signed)
Called patient- she notes she is not able to use her mychart account any longer as "my computer is too old" She has an appt on Friday to follow-up with GYN surgery

## 2023-04-24 DIAGNOSIS — H353221 Exudative age-related macular degeneration, left eye, with active choroidal neovascularization: Secondary | ICD-10-CM | POA: Diagnosis not present

## 2023-04-26 ENCOUNTER — Telehealth: Payer: Self-pay | Admitting: Family Medicine

## 2023-04-26 DIAGNOSIS — R1030 Lower abdominal pain, unspecified: Secondary | ICD-10-CM | POA: Diagnosis not present

## 2023-04-26 DIAGNOSIS — R9389 Abnormal findings on diagnostic imaging of other specified body structures: Secondary | ICD-10-CM | POA: Diagnosis not present

## 2023-04-26 NOTE — Telephone Encounter (Signed)
Patient states that [facility], gynecologic oncology, does plan to go to surgery and will not be put to sleep, but they will do something that will not affect her hearing - just wanted to update the provider

## 2023-05-27 ENCOUNTER — Telehealth: Payer: Self-pay | Admitting: Family Medicine

## 2023-05-27 NOTE — Telephone Encounter (Signed)
Pt notes she is seeing a special GYN next week to discuss her endometrial issue and we think have a hysteroscopy-she was just wondering if this is liable to cause her to miss her Thanksgiving plans.  I advised I do not think a hysteroscopy will have any long-term recovery

## 2023-05-27 NOTE — Telephone Encounter (Signed)
Pt requesting call back from DR. Copland regarding the pain in her side. Pt said she is supposed to be having a hysteroscopy and has a quick question for her and cannot access her mychart. Please call when able to discuss.

## 2023-05-29 DIAGNOSIS — N183 Chronic kidney disease, stage 3 unspecified: Secondary | ICD-10-CM | POA: Diagnosis not present

## 2023-05-29 DIAGNOSIS — I4891 Unspecified atrial fibrillation: Secondary | ICD-10-CM | POA: Diagnosis not present

## 2023-05-29 DIAGNOSIS — H353221 Exudative age-related macular degeneration, left eye, with active choroidal neovascularization: Secondary | ICD-10-CM | POA: Diagnosis not present

## 2023-05-29 DIAGNOSIS — I129 Hypertensive chronic kidney disease with stage 1 through stage 4 chronic kidney disease, or unspecified chronic kidney disease: Secondary | ICD-10-CM | POA: Diagnosis not present

## 2023-05-30 DIAGNOSIS — R0600 Dyspnea, unspecified: Secondary | ICD-10-CM | POA: Diagnosis not present

## 2023-05-30 DIAGNOSIS — I4891 Unspecified atrial fibrillation: Secondary | ICD-10-CM | POA: Diagnosis not present

## 2023-05-30 DIAGNOSIS — K5521 Angiodysplasia of colon with hemorrhage: Secondary | ICD-10-CM | POA: Diagnosis not present

## 2023-06-03 DIAGNOSIS — H353211 Exudative age-related macular degeneration, right eye, with active choroidal neovascularization: Secondary | ICD-10-CM | POA: Diagnosis not present

## 2023-06-07 ENCOUNTER — Ambulatory Visit: Payer: Medicare Other | Admitting: Physician Assistant

## 2023-06-19 DIAGNOSIS — L570 Actinic keratosis: Secondary | ICD-10-CM | POA: Diagnosis not present

## 2023-06-24 ENCOUNTER — Telehealth: Payer: Self-pay | Admitting: Family Medicine

## 2023-06-24 MED ORDER — CLOBETASOL PROPIONATE 0.05 % EX CREA: 1.0000 | TOPICAL_CREAM | CUTANEOUS | 1 refills | Status: AC

## 2023-06-24 NOTE — Telephone Encounter (Signed)
Prescription Request  06/24/2023  Is this a "Controlled Substance" medicine? No  LOV: 03/28/2023  What is the name of the medication or equipment?   clobetasol cream (TEMOVATE) 0.05 % [[id number]]  Have you contacted your pharmacy to request a refill? No   Which pharmacy would you like this sent to?   [facility] - [place], [place] - [address] [place] [address] Phone: [phone] Fax: [phone]    Patient notified that their request is being sent to the clinical staff for review and that they should receive a response within 2 business days.   Please advise at Mobile There is no such number on file (mobile).

## 2023-06-24 NOTE — Addendum Note (Signed)
Addended by: Abbe Amsterdam C on: 06/24/2023 04:54 PM   Modules accepted: Orders

## 2023-06-24 NOTE — Telephone Encounter (Signed)
Okay for refill? Prior RX has "historical provider" on it.

## 2023-07-02 ENCOUNTER — Other Ambulatory Visit: Payer: Self-pay

## 2023-07-02 DIAGNOSIS — I4891 Unspecified atrial fibrillation: Secondary | ICD-10-CM

## 2023-07-02 MED ORDER — APIXABAN 5 MG PO TABS: 5.0000 mg | ORAL_TABLET | Freq: Two times a day (BID) | ORAL | 1 refills | Status: DC

## 2023-07-03 DIAGNOSIS — H353231 Exudative age-related macular degeneration, bilateral, with active choroidal neovascularization: Secondary | ICD-10-CM | POA: Diagnosis not present

## 2023-07-18 DIAGNOSIS — Z79899 Other long term (current) drug therapy: Secondary | ICD-10-CM | POA: Diagnosis not present

## 2023-07-18 DIAGNOSIS — R9389 Abnormal findings on diagnostic imaging of other specified body structures: Secondary | ICD-10-CM | POA: Diagnosis not present

## 2023-07-18 DIAGNOSIS — Z7901 Long term (current) use of anticoagulants: Secondary | ICD-10-CM | POA: Diagnosis not present

## 2023-07-18 DIAGNOSIS — I2109 ST elevation (STEMI) myocardial infarction involving other coronary artery of anterior wall: Secondary | ICD-10-CM | POA: Diagnosis not present

## 2023-07-18 DIAGNOSIS — I129 Hypertensive chronic kidney disease with stage 1 through stage 4 chronic kidney disease, or unspecified chronic kidney disease: Secondary | ICD-10-CM | POA: Diagnosis not present

## 2023-07-18 DIAGNOSIS — Z91040 Latex allergy status: Secondary | ICD-10-CM | POA: Diagnosis not present

## 2023-07-18 DIAGNOSIS — N183 Chronic kidney disease, stage 3 unspecified: Secondary | ICD-10-CM | POA: Diagnosis not present

## 2023-07-18 DIAGNOSIS — I4891 Unspecified atrial fibrillation: Secondary | ICD-10-CM | POA: Diagnosis not present

## 2023-08-07 DIAGNOSIS — H353231 Exudative age-related macular degeneration, bilateral, with active choroidal neovascularization: Secondary | ICD-10-CM | POA: Diagnosis not present

## 2023-08-09 DIAGNOSIS — R9389 Abnormal findings on diagnostic imaging of other specified body structures: Secondary | ICD-10-CM | POA: Diagnosis not present

## 2023-08-10 NOTE — Progress Notes (Addendum)
[facility] [address] [place], [place] [address] [phone]  Date:  08/14/2023   Name:  Wendy Hebert   DOB:  09/02/1933   MRN:  [id number]  PCP:  Pearline Cables, MD    Chief Complaint: Abdominal Pain (Pt asks if you are able to see where the polyp was that was removed. )   History of Present Illness:  Wendy Hebert is a 89 y.o. 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 We got a CT abdomen pelvis in April of this year for  abdominal pain -report showed endometrial thickening but was otherwise normal, we then proceeded to a pelvic ultrasound and she ended up undergoing hysteroscopy on 11/14 per Dr. Festus Holts at [facility] -Per my understanding of pathology no malignancy noted. Pt notes her polyp was found to be "pre-cancerous"- they discussed doing a repeat procedure in 6 months vs hyst.  She is not sure what she wants to do next - she is still thinking about this . She had bleeding for some time after her procedure- in fact it just stopped this week   Pt notes her abd pain does seem to be better since she had the hysteroscopy- I am not sure why but this is great news  She notes she had a bad cramp in her left thigh when she was on vacation in the [place] in October - this resolved.  Explained that a blood clot is unlikely because she is on Eliquis  Negative Cologuard 2020 Most recent blood work done in June  She added some iron to her regimen due to recent bleeding  We also noted mild leukocytosis on recent  blood work, will recheck a CBC and CMP today  She lives alone, plans to drive about an hour and a half to [place] to meet up with her family for Christmas Patient Active Problem List   Diagnosis Date Noted   Osteopenia 08/25/2019   Malignant neoplasm of upper-inner quadrant of right breast in female, estrogen receptor positive (HCC) 02/16/2019   Atrial fibrillation (HCC) 10/01/2018   Gait difficulty 03/20/2018   Lichen sclerosus 03/20/2018    Past Medical History:  Diagnosis Date   Arthritis    Atrial fibrillation (HCC)    Cancer (HCC) 2017   1.6 lumpectomy   Depression    Hypertension    Lichen sclerosus     Past Surgical History:  Procedure Laterality Date   APPENDECTOMY     BOWEL RESECTION     BREAST LUMPECTOMY  2017   CARDIOVERSION N/A 11/10/2018   Procedure: CARDIOVERSION;  Surgeon: Chilton Si, MD;  Location: [facility] ENDOSCOPY;  Service: Cardiovascular;  Laterality: N/A;   CESAREAN SECTION     HERNIA REPAIR     LESION REMOVAL Right 04/24/2022   Procedure: WIDE LOCAL EXCISION RIGHT CALF SQUAMOUS CELL CANCER;  Surgeon: Almond Lint, MD;  Location: [facility] OR;  Service: General;  Laterality: Right;   TONSILLECTOMY      Social History   Tobacco Use   Smoking status: Former   Smokeless  tobacco: Never   Tobacco comments:    30+ years  Vaping Use   Vaping status: Never Used  Substance Use Topics   Alcohol use: Not Currently    Comment: occasionally   Drug use: Never    Family History  Problem Relation Age of Onset   Cancer Father        Lung   Cancer Sister     No Known Allergies  Medication list has been reviewed and updated.  Current Outpatient Medications on File Prior to Visit  Medication Sig Dispense Refill   ALPRAZolam (XANAX) 0.5 MG tablet TAKE 1/2 TO 1 TABLET BY MOUTH  TWICE DAILY AS NEEDED FOR  ANXIETY 60 tablet 1   apixaban (ELIQUIS) 5 MG TABS tablet Take 1 tablet (5 mg total) by mouth 2 (two) times daily. 180 tablet 1   BIOTIN PO Take 500 mg by  mouth daily.     Calcium Carb-Cholecalciferol (CALCIUM 600 + D PO) Take 1 tablet by mouth every evening.     Cholecalciferol (VITAMIN D3) 50 MCG (2000 UT) capsule Take 2,000 Units by mouth daily.     clobetasol cream (TEMOVATE) 0.05 % Apply 1 Application topically once a week. Use as needed for eczema and rash 60 g 1   cyanocobalamin (VITAMIN B12) 1000 MCG tablet Take 1,000 mcg by mouth daily.     diltiazem (CARDIZEM CD) 180 MG 24 hr capsule Take 180 mg by mouth daily.     losartan (COZAAR) 25 MG tablet Take 25 mg by mouth 2 (two) times daily.     metoprolol tartrate (LOPRESSOR) 25 MG tablet TAKE 1 AND 1/2 TABLETS BY MOUTH  IN THE MORNING AND 1 TABLET BY  MOUTH IN THE EVENING 175 tablet 4   Multiple Vitamin (MULTIVITAMIN WITH MINERALS) TABS tablet Take 1 tablet by mouth every evening.     Multiple Vitamins-Minerals (PRESERVISION AREDS 2) CAPS Take 2 capsules by mouth at bedtime.     Omega 3 1200 MG CAPS Take 1,200 mg by mouth daily at 2 am.     vitamin C (ASCORBIC ACID) 500 MG tablet Take 500 mg by mouth daily.     VITAMIN E PO Take 90 mg by mouth daily.     No current facility-administered medications on file prior to visit.    Review of Systems:  As per HPI- otherwise negative.   Physical Examination: Vitals:   08/14/23 1322  BP: 122/80  Resp: 18  Temp: 98.2 F (36.8 C)  SpO2: 95%   Vitals:   08/14/23 1322  Weight: 140 lb (63.5 kg)   Body mass index is 26.45 kg/m. Ideal Body Weight:    GEN: no acute distress.  Normal weight for age, appears well in her typical self.  Seated in wheelchair HEENT: Atraumatic, Normocephalic.  Ears and Nose: No external deformity. CV: RRR, No M/G/R. No JVD. No thrill. No extra heart sounds. PULM: CTA B, no wheezes, crackles, rhonchi. No retractions. No resp. distress. No accessory muscle use. ABD: S, NT, ND, +BS. No rebound. No HSM. EXTR: No c/c/e PSYCH: Normally interactive. Conversant.    Assessment and Plan: Atrial fibrillation,  unspecified type [facility])  Renal insufficiency - Plan: Comprehensive metabolic panel  Acute blood loss anemia - Plan: CBC  Patient seen today for follow-up.  She has rate controlled atrial fibrillation on appropriate anticoagulation-we are able to give her some samples of Eliquis today She asked where exactly her uterus the polyp was located.  I explained that I am  not sure, but postmenopause the uterus is very small so the exact location in the uterus would not typically affect the location of her pain.  She states understanding.    Follow-up on renal insufficiency and postoperative blood loss with lab work today  I asked her to see me in 3 to 4 months for follow-up  Signed Abbe Amsterdam, MD  Addendum 12/12, received blood work as below. Called patient as her renal function is worse-CBC back to normal She notes her urination is about the same, no pain, no blood She may get up to pee 3-4x at night but this is not new  Admits she often does not drink very much liquid at all during the day.  I made her an appointment to recheck her renal function next week, she will try to drink more water before hand.  If her kidney function is not trending back towards her typical baseline I will obtain renal ultrasound  Results for orders placed or performed in visit on 08/14/23  CBC   Collection Time: 08/14/23  1:49 PM  Result Value Ref Range   WBC 10.3 4.0 - 10.5 K/uL   RBC 3.99 3.87 - 5.11 Mil/uL   Platelets 354.0 150.0 - 400.0 K/uL   Hemoglobin 12.7 12.0 - 15.0 g/dL   HCT 95.6 21.3 - 08.6 %   MCV 94.8 78.0 - 100.0 fl   MCHC 33.6 30.0 - 36.0 g/dL   RDW 57.8 46.9 - 62.9 %  Comprehensive metabolic panel   Collection Time: 08/14/23  1:49 PM  Result Value Ref Range   Sodium 140 135 - 145 mEq/L   Potassium 3.7 3.5 - 5.1 mEq/L   Chloride 103 96 - 112 mEq/L   CO2 25 19 - 32 mEq/L   Glucose, Bld 129 (H) 70 - 99 mg/dL   BUN 32 (H) 6 - 23 mg/dL   Creatinine, Ser 5.28 (H) 0.40 - 1.20 mg/dL   Total  Bilirubin 0.6 0.2 - 1.2 mg/dL   Alkaline Phosphatase 101 39 - 117 U/L   AST 25 0 - 37 U/L   ALT 20 0 - 35 U/L   Total Protein 6.5 6.0 - 8.3 g/dL   Albumin 4.0 3.5 - 5.2 g/dL   GFR 41.32 (L) >44.01 mL/min   Calcium 8.8 8.4 - 10.5 mg/dL   Received updated labs 12/20- renal function treading in a good direction She is trying to drink more water Plan to visit in 2 month for recheck   Chemistry      Component Value Date/Time   NA 139 08/20/2023 1345   NA 141 07/03/2018 0000   K 3.9 08/20/2023 1345   CL 103 08/20/2023 1345   CL 102 07/03/2018 0000   CO2 26 08/20/2023 1345   CO2 23 07/03/2018 0000   BUN 29 (H) 08/20/2023 1345   BUN 16 07/03/2018 0000   CREATININE 1.45 (H) 08/20/2023 1345   CREATININE 0.94 11/22/2020 1431   GLU 100 07/03/2018 0000      Component Value Date/Time   CALCIUM 8.9 08/20/2023 1345   CALCIUM 9.3 07/03/2018 0000   ALKPHOS 101 08/14/2023 1349   AST 25 08/14/2023 1349   AST 26 11/22/2020 1431   ALT 20 08/14/2023 1349   ALT 19 11/22/2020 1431   BILITOT 0.6 08/14/2023 1349   BILITOT 0.6 11/22/2020 1431

## 2023-08-14 ENCOUNTER — Ambulatory Visit: Payer: Medicare Other | Admitting: Family Medicine

## 2023-08-14 VITALS — BP 122/80 | Temp 98.2°F | Resp 18 | Wt 140.0 lb

## 2023-08-14 DIAGNOSIS — D62 Acute posthemorrhagic anemia: Secondary | ICD-10-CM

## 2023-08-14 DIAGNOSIS — N289 Disorder of kidney and ureter, unspecified: Secondary | ICD-10-CM

## 2023-08-14 DIAGNOSIS — I4891 Unspecified atrial fibrillation: Secondary | ICD-10-CM | POA: Diagnosis not present

## 2023-08-15 LAB — COMPREHENSIVE METABOLIC PANEL
ALT: 20 U/L (ref 0–35)
AST: 25 U/L (ref 0–37)
Albumin: 4 g/dL (ref 3.5–5.2)
Alkaline Phosphatase: 101 U/L (ref 39–117)
BUN: 32 mg/dL — ABNORMAL HIGH (ref 6–23)
CO2: 25 meq/L (ref 19–32)
Calcium: 8.8 mg/dL (ref 8.4–10.5)
Chloride: 103 meq/L (ref 96–112)
Creatinine, Ser: 1.61 mg/dL — ABNORMAL HIGH (ref 0.40–1.20)
GFR: 28.45 mL/min — ABNORMAL LOW (ref >60.00)
Glucose, Bld: 129 mg/dL — ABNORMAL HIGH (ref 70–99)
Potassium: 3.7 meq/L (ref 3.5–5.1)
Sodium: 140 meq/L (ref 135–145)
Total Bilirubin: 0.6 mg/dL (ref 0.2–1.2)
Total Protein: 6.5 g/dL (ref 6.0–8.3)

## 2023-08-15 LAB — CBC
HCT: 37.8 % (ref 36.0–46.0)
Hemoglobin: 12.7 g/dL (ref 12.0–15.0)
MCHC: 33.6 g/dL (ref 30.0–36.0)
MCV: 94.8 fL (ref 78.0–100.0)
Platelets: 354 10*3/uL (ref 150.0–400.0)
RBC: 3.99 Mil/uL (ref 3.87–5.11)
RDW: 15 % (ref 11.5–15.5)
WBC: 10.3 10*3/uL (ref 4.0–10.5)

## 2023-08-15 NOTE — Addendum Note (Signed)
Addended by: Abbe Amsterdam C on: 08/15/2023 12:58 PM   Modules accepted: Orders

## 2023-08-20 ENCOUNTER — Other Ambulatory Visit (INDEPENDENT_AMBULATORY_CARE_PROVIDER_SITE_OTHER): Payer: Medicare Other

## 2023-08-20 DIAGNOSIS — N289 Disorder of kidney and ureter, unspecified: Secondary | ICD-10-CM

## 2023-08-21 LAB — BASIC METABOLIC PANEL
BUN: 29 mg/dL — ABNORMAL HIGH (ref 6–23)
CO2: 26 meq/L (ref 19–32)
Calcium: 8.9 mg/dL (ref 8.4–10.5)
Chloride: 103 meq/L (ref 96–112)
Creatinine, Ser: 1.45 mg/dL — ABNORMAL HIGH (ref 0.40–1.20)
GFR: 32.25 mL/min — ABNORMAL LOW (ref >60.00)
Glucose, Bld: 123 mg/dL — ABNORMAL HIGH (ref 70–99)
Potassium: 3.9 meq/L (ref 3.5–5.1)
Sodium: 139 meq/L (ref 135–145)

## 2023-09-05 ENCOUNTER — Telehealth: Payer: Self-pay

## 2023-09-05 NOTE — Telephone Encounter (Signed)
 Initial Comment Caller states she has had some upper respiratory symptoms going on for a few days caller states she thinks it may be head congestion. Translation No Disp. Time Titus Time) Disposition Final User 09/04/2023 1:52:37 PM Send To Nurse Acey Muzzy, RN, Dorothyann 09/04/2023 2:18:54 PM Send To RN Personal Kristi, RN, Windy 09/04/2023 2:43:28 PM FINAL ATTEMPT MADE - message left Yes Estanislado, RN, Karna 09/04/2023 2:43:37 PM Send to RN Final Attempt Vallery Estanislado, RN, Denise Final Disposition 09/04/2023 2:43:28 PM FINAL ATTEMPT MADE - message left Yes Carmon, RN, Karna

## 2023-09-06 NOTE — Telephone Encounter (Signed)
 Called and spoke with the pt. She says she is feeling better and was just nervous about having RSV but is sure it was only in her head.  She did ask about needing a hysterectomy and your opinion on it. She says the Polyp is precancerous. She wonders: 1. How long it would take to turn into a cancer?  If cancer occurs, what would her prognosis be? - considering her age, would a hysterectomy be worth it?

## 2023-09-09 NOTE — Telephone Encounter (Signed)
I called her and we discussed.

## 2023-09-17 DIAGNOSIS — H353231 Exudative age-related macular degeneration, bilateral, with active choroidal neovascularization: Secondary | ICD-10-CM | POA: Diagnosis not present

## 2023-09-20 DIAGNOSIS — R9389 Abnormal findings on diagnostic imaging of other specified body structures: Secondary | ICD-10-CM | POA: Diagnosis not present

## 2023-09-30 ENCOUNTER — Other Ambulatory Visit: Payer: Self-pay | Admitting: Family Medicine

## 2023-09-30 DIAGNOSIS — I4891 Unspecified atrial fibrillation: Secondary | ICD-10-CM

## 2023-10-01 ENCOUNTER — Telehealth: Payer: Self-pay

## 2023-10-01 NOTE — Telephone Encounter (Signed)
Called her back-no answer, left message on machine.  I am not quite sure what procedure she is talking about, I think she did have an endometrial biopsy recently.  We are glad to see her, but I also want to offer any more urgent help if needed.  Please let me know what we can do

## 2023-10-01 NOTE — Telephone Encounter (Signed)
Copied from CRM [id number]. Topic: Appointments - Appointment Scheduling >> Oct 01, 2023  9:36 AM Elmarie Shiley H wrote: Patient/patient representative is calling to schedule an appointment. Refer to attachments for appointment information.   Patient advised that she is having persistent pain post op.

## 2023-10-01 NOTE — Telephone Encounter (Signed)
Should pt f/u with gyn?

## 2023-10-09 ENCOUNTER — Telehealth: Payer: Self-pay | Admitting: *Deleted

## 2023-10-09 NOTE — Telephone Encounter (Signed)
 Pt is scheduled for labs on 10/24/23.  There are no future orders in place.  Per 12/11 office note documentation of lab results pt was advised to follow up in 2 months to recheck.  Is she supposed to be having an office visit or lab appt only?  If lab only can you place future order?

## 2023-10-10 NOTE — Telephone Encounter (Signed)
 Upon further chart review, it appears that pt has OV with PCP on 2/20 at 2pm. I went ahead and cancelled lab appt since labs will be done with OV if they are needed at time of visit.

## 2023-10-10 NOTE — Telephone Encounter (Signed)
 Attempted to reach pt to r/s lab appt to OV with PCP and left message for her to return my call.

## 2023-10-14 ENCOUNTER — Telehealth: Payer: Self-pay | Admitting: Family Medicine

## 2023-10-14 NOTE — Telephone Encounter (Signed)
 Copied from CRM [id number]. Topic: Medicare AWV >> Oct 14, 2023  1:50 PM Juliana Ocean wrote: Reason for CRM: Called LVM 10/14/2023 to schedule AWV. Please schedule Virtual or Telehealth visits ONLY.   Rosalee Collins; Care Guide Ambulatory Clinical Support [place] [facility] Direct Dial: [phone]

## 2023-10-16 ENCOUNTER — Other Ambulatory Visit: Payer: Medicare Other

## 2023-10-16 ENCOUNTER — Ambulatory Visit: Payer: Medicare Other | Admitting: Family Medicine

## 2023-10-16 DIAGNOSIS — H353231 Exudative age-related macular degeneration, bilateral, with active choroidal neovascularization: Secondary | ICD-10-CM | POA: Diagnosis not present

## 2023-10-17 ENCOUNTER — Other Ambulatory Visit: Payer: Self-pay | Admitting: Family Medicine

## 2023-10-17 MED ORDER — TRAZODONE HCL 50 MG PO TABS: 25.0000 mg | ORAL_TABLET | Freq: Every evening | ORAL | 3 refills | Status: DC | PRN

## 2023-10-17 NOTE — Telephone Encounter (Signed)
Copied from CRM [id number]. Topic: Clinical - Medication Refill >> Oct 17, 2023 11:28 AM Dennison Nancy wrote: Most Recent Primary Care Visit:  Provider: [facility]  Department: [facility]  Visit Type: LAB  Date: 08/20/2023  Medication: Trazodone mg. 50 mg ( patient stated the medication is for her sleep)  Has the patient contacted their pharmacy? No (Agent: If no, request that the patient contact the pharmacy for the refill. If patient does not wish to contact the pharmacy document the reason why and proceed with request.) (Agent: If yes, when and what did the pharmacy advise?)  Is this the correct pharmacy for this prescription? Yes If no, delete pharmacy and type the correct one.  This is the patient's preferred pharmacy:   [facility] - [place], [place] - [address] [place] [address] Phone: [phone] Fax: [phone]   Has the prescription been filled recently? No  Is the patient out of the medication? No have about 2 more nights   Has the patient been seen for an appointment in the last year OR does the patient have an upcoming appointment? Yes  Can we respond through MyChart? No  Agent: Please be advised that Rx refills may take up to 3 business days. We ask that you follow-up with your pharmacy.

## 2023-10-22 ENCOUNTER — Other Ambulatory Visit: Payer: Medicare Other

## 2023-10-23 ENCOUNTER — Ambulatory Visit: Payer: Medicare Other | Admitting: Family Medicine

## 2023-10-23 NOTE — Progress Notes (Unsigned)
 [facility] [address] [place], [place] [address] [phone]  Date:  10/24/2023   Name:  Wendy Hebert   DOB:  12/16/1932   MRN:  [id number]  PCP:  Pearline Cables, MD    Chief Complaint: No chief complaint on file.   History of Present Illness:  Wendy Hebert is a 89 y.o. very pleasant female patient who presents with the following:  Pt seen today for recheck visit Last seen by myself in December when she was still dealing with prolonged abd pain History of breast cancer, atrial fibrillation on Eliquis, lichen sclerosis, SSC on her leg with delayed healing after surgical resection   Abd pain work up: Most recent visit with myself was in [place] had complaint of generalized abdominal pain at that time as well-pain has been present now for about 18 months if not longer. We got a CT abdomen pelvis in April of this year for  abdominal pain -report showed endometrial thickening but was otherwise normal, we then proceeded to a pelvic ultrasound and she ended up undergoing hysteroscopy on 11/14 per Dr. Festus Holts at [facility] -Per my understanding of pathology no malignancy noted. Pt notes her polyp was found to be "pre-cancerous"- they discussed doing a repeat procedure in 6 months vs hyst.  She is not sure what she wants to do next - she is still thinking about this . She had bleeding for some time after her procedure- in fact it just stopped this week  Pt notes her abd pain does seem to be better since she had the hysteroscopy- I am not sure why but this is great news  Labs done in December:  Covid booster Shingrix Flu vaccine   Patient Active Problem List   Diagnosis Date Noted   Osteopenia 08/25/2019   Malignant neoplasm of upper-inner quadrant of right breast in female, estrogen receptor positive (HCC) 02/16/2019   Atrial fibrillation (HCC) 10/01/2018   Gait difficulty 03/20/2018   Lichen  sclerosus 03/20/2018    Past Medical History:  Diagnosis Date   Arthritis    Atrial fibrillation (HCC)    Cancer (HCC) 2017   1.6 lumpectomy   Depression    Hypertension    Lichen sclerosus     Past Surgical History:  Procedure Laterality Date   APPENDECTOMY     BOWEL RESECTION     BREAST LUMPECTOMY  2017   CARDIOVERSION N/A 11/10/2018   Procedure: CARDIOVERSION;  Surgeon: Chilton Si, MD;  Location: [facility] ENDOSCOPY;  Service: Cardiovascular;  Laterality: N/A;   CESAREAN SECTION     HERNIA REPAIR     LESION REMOVAL Right 04/24/2022   Procedure: WIDE LOCAL EXCISION RIGHT CALF SQUAMOUS CELL CANCER;  Surgeon: Almond Lint, MD;  Location: [facility] OR;  Service: General;  Laterality: Right;   TONSILLECTOMY      Social History   Tobacco Use   Smoking status: Former   Smokeless tobacco: Never   Tobacco comments:    30+ years  Vaping Use   Vaping status: Never Used  Substance Use Topics   Alcohol use: Not Currently    Comment: occasionally   Drug use: Never    Family History  Problem Relation Age of Onset   Cancer Father        Lung   Cancer Sister     No Known Allergies  Medication list has been reviewed and updated.  Current Outpatient Medications  on File Prior to Visit  Medication Sig Dispense Refill   ALPRAZolam (XANAX) 0.5 MG tablet TAKE 1/2 TO 1 TABLET BY MOUTH  TWICE DAILY AS NEEDED FOR  ANXIETY 60 tablet 1   apixaban (ELIQUIS) 5 MG TABS tablet Take 1 tablet (5 mg total) by mouth 2 (two) times daily. 180 tablet 1   BIOTIN PO Take 500 mg by mouth daily.     Calcium Carb-Cholecalciferol (CALCIUM 600 + D PO) Take 1 tablet by mouth every evening.     Cholecalciferol (VITAMIN D3) 50 MCG (2000 UT) capsule Take 2,000 Units by mouth daily.     clobetasol cream (TEMOVATE) 0.05 % Apply 1 Application topically once a week. Use as needed for eczema and rash 60 g 1   cyanocobalamin (VITAMIN B12) 1000 MCG tablet Take 1,000 mcg by mouth daily.     diltiazem (CARDIZEM CD) 180  MG 24 hr capsule Take 180 mg by mouth daily.     losartan (COZAAR) 25 MG tablet Take 25 mg by mouth 2 (two) times daily.     metoprolol tartrate (LOPRESSOR) 25 MG tablet TAKE 1 AND 1/2 TABLETS BY MOUTH  IN THE MORNING AND 1 TABLET BY  MOUTH IN THE EVENING 225 tablet 2   Multiple Vitamin (MULTIVITAMIN WITH MINERALS) TABS tablet Take 1 tablet by mouth every evening.     Multiple Vitamins-Minerals (PRESERVISION AREDS 2) CAPS Take 2 capsules by mouth at bedtime.     Omega 3 1200 MG CAPS Take 1,200 mg by mouth daily at 2 am.     traZODone (DESYREL) 50 MG tablet Take 0.5-1 tablets (25-50 mg total) by mouth at bedtime as needed for sleep. 30 tablet 3   vitamin C (ASCORBIC ACID) 500 MG tablet Take 500 mg by mouth daily.     VITAMIN E PO Take 90 mg by mouth daily.     No current facility-administered medications on file prior to visit.    Review of Systems:  As per HPI- otherwise negative.   Physical Examination: There were no vitals filed for this visit. There were no vitals filed for this visit. There is no height or weight on file to calculate BMI. Ideal Body Weight:    GEN: no acute distress. HEENT: Atraumatic, Normocephalic.  Ears and Nose: No external deformity. CV: RRR, No M/G/R. No JVD. No thrill. No extra heart sounds. PULM: CTA B, no wheezes, crackles, rhonchi. No retractions. No resp. distress. No accessory muscle use. ABD: S, NT, ND, +BS. No rebound. No HSM. EXTR: No c/c/e PSYCH: Normally interactive. Conversant.    Assessment and Plan: ***  Signed Abbe Amsterdam, MD

## 2023-10-24 ENCOUNTER — Ambulatory Visit: Payer: Medicare Other | Admitting: Family Medicine

## 2023-10-24 ENCOUNTER — Other Ambulatory Visit: Payer: Medicare Other

## 2023-10-24 ENCOUNTER — Encounter: Payer: Self-pay | Admitting: Family Medicine

## 2023-10-24 ENCOUNTER — Ambulatory Visit (HOSPITAL_BASED_OUTPATIENT_CLINIC_OR_DEPARTMENT_OTHER)
Admission: RE | Admit: 2023-10-24 | Discharge: 2023-10-24 | Disposition: A | Discharge status: Home / Self Care | Payer: Medicare Other | Source: Ambulatory Visit | Attending: Family Medicine | Admitting: Family Medicine

## 2023-10-24 VITALS — BP 104/76 | HR 76 | Ht 61.0 in | Wt 134.0 lb

## 2023-10-24 DIAGNOSIS — I4891 Unspecified atrial fibrillation: Secondary | ICD-10-CM

## 2023-10-24 DIAGNOSIS — M549 Dorsalgia, unspecified: Secondary | ICD-10-CM

## 2023-10-24 LAB — BASIC METABOLIC PANEL
BUN: 25 mg/dL — ABNORMAL HIGH (ref 6–23)
CO2: 26 meq/L (ref 19–32)
Calcium: 8.8 mg/dL (ref 8.4–10.5)
Chloride: 103 meq/L (ref 96–112)
Creatinine, Ser: 1.5 mg/dL — ABNORMAL HIGH (ref 0.40–1.20)
GFR: 30.93 mL/min — ABNORMAL LOW (ref >60.00)
Glucose, Bld: 88 mg/dL (ref 70–99)
Potassium: 3.8 meq/L (ref 3.5–5.1)
Sodium: 139 meq/L (ref 135–145)

## 2023-10-24 LAB — D-DIMER, QUANTITATIVE: D-Dimer, Quant: 0.54 ug{FEU}/mL — ABNORMAL HIGH (ref <0.50)

## 2023-10-24 LAB — TROPONIN I (HIGH SENSITIVITY): High Sens Troponin I: 8 ng/L (ref 2–17)

## 2023-10-24 NOTE — Patient Instructions (Addendum)
 Good to see you Your EKG does not look alarming Please go to lab- we will check a troponin to watch for any sign of heart muscle distress, and a D dimer to rule out a blood clot in the lungs Also will get a chest x-ray for you  Please let me know if anything is changing or getting worse

## 2023-10-25 NOTE — Addendum Note (Signed)
 Addended by: Abbe Amsterdam C on: 10/25/2023 01:22 PM   Modules accepted: Orders

## 2023-11-20 DIAGNOSIS — H353231 Exudative age-related macular degeneration, bilateral, with active choroidal neovascularization: Secondary | ICD-10-CM | POA: Diagnosis not present

## 2023-12-03 ENCOUNTER — Telehealth: Payer: Self-pay

## 2023-12-03 NOTE — Telephone Encounter (Signed)
 Unsuccessful attempts to reach patient on preferred number listed in notes for scheduled AWV. Left message on voicemail okay to reschedule.

## 2023-12-23 ENCOUNTER — Other Ambulatory Visit: Payer: Self-pay | Admitting: Family Medicine

## 2023-12-23 DIAGNOSIS — I4891 Unspecified atrial fibrillation: Secondary | ICD-10-CM

## 2023-12-25 DIAGNOSIS — H353231 Exudative age-related macular degeneration, bilateral, with active choroidal neovascularization: Secondary | ICD-10-CM | POA: Diagnosis not present

## 2024-01-14 ENCOUNTER — Telehealth: Payer: Self-pay

## 2024-01-14 MED ORDER — TRAZODONE HCL 50 MG PO TABS
25.0000 mg | ORAL_TABLET | Freq: Every evening | ORAL | 3 refills | Status: DC | PRN
Start: 2024-01-14 — End: 2024-03-09

## 2024-01-14 NOTE — Telephone Encounter (Signed)
 Rx sent.

## 2024-01-14 NOTE — Telephone Encounter (Signed)
 Copied from CRM [id number]. Topic: Clinical - Medication Question >> Jan 14, 2024  9:53 AM Sophia H wrote: Reason for CRM: Pt is needing to refill her medication to help her sleep, she is unaware of the name of the medication. Requesting prescription be sent in to [id number] - [place],  - [address] AT [facility] [address]. Please advise [phone]

## 2024-01-28 ENCOUNTER — Telehealth: Payer: Self-pay

## 2024-01-28 NOTE — Telephone Encounter (Signed)
 Copied from CRM [id number]. Topic: General - Other >> Jan 28, 2024 12:49 PM Adaysia C wrote: Reason for CRM: [place] with [employer] called to verify patients Atrial fibrillation [facility]) diagnoses for health plan eligibility; Verified patients diagnoses with [employer]; please follow up with [employer] if necessary [phone]

## 2024-01-29 DIAGNOSIS — H353231 Exudative age-related macular degeneration, bilateral, with active choroidal neovascularization: Secondary | ICD-10-CM | POA: Diagnosis not present

## 2024-02-04 NOTE — Progress Notes (Unsigned)
 [facility] [address], [address] [place], [place] [address] [phone]  Date:  02/05/2024   Name:  Wendy Hebert   DOB:  1932/10/03   MRN:  [id number]  PCP:  Kaylee Partridge, MD    Chief Complaint: No chief complaint on file.   History of Present Illness:  Wendy Hebert is a 89 y.o. very pleasant female patient who presents with the following:  Patient seen today with concern of "lung issue" Most recent visit with myself was in February of this year History of breast cancer, atrial fibrillation on Eliquis , lichen sclerosis, SSC on her leg with delayed healing after surgical resection  Earlier this year we were dealing with chronic abdominal pain of uncertain etiology. During her workup we did find an endometrial polyp, she plans undergo hysteroscopy and D&C in July per Dr. Loetta Ringer with [facility]  Eliquis  5 twice daily Diltiazem 180 once daily Metoprolol  twice daily Trazodone  at bedtime as needed Patient Active Problem List   Diagnosis Date Noted   Osteopenia 08/25/2019   Malignant neoplasm of upper-inner quadrant of right breast in female, estrogen receptor positive (HCC) 02/16/2019   Atrial fibrillation (HCC) 10/01/2018   Gait difficulty 03/20/2018   Lichen sclerosus 03/20/2018    Past Medical History:  Diagnosis Date   Arthritis    Atrial fibrillation (HCC)    Cancer (HCC) 2017   1.6 lumpectomy   Depression    Hypertension    Lichen sclerosus     Past Surgical History:  Procedure Laterality Date   APPENDECTOMY     BOWEL RESECTION     BREAST LUMPECTOMY  2017   CARDIOVERSION N/A 11/10/2018   Procedure: CARDIOVERSION;  Surgeon: Maudine Sos, MD;  Location: [facility] ENDOSCOPY;  Service: Cardiovascular;  Laterality: N/A;   CESAREAN SECTION     HERNIA REPAIR     LESION REMOVAL Right 04/24/2022   Procedure: WIDE LOCAL EXCISION RIGHT CALF SQUAMOUS CELL CANCER;  Surgeon: Lockie Rima, MD;   Location: [facility] OR;  Service: General;  Laterality: Right;   TONSILLECTOMY      Social History   Tobacco Use   Smoking status: Former   Smokeless tobacco: Never   Tobacco comments:    30+ years  Vaping Use   Vaping status: Never Used  Substance Use Topics   Alcohol use: Not Currently    Comment: occasionally   Drug use: Never    Family History  Problem Relation Age of Onset   Cancer Father        Lung   Cancer Sister     No Known Allergies  Medication list has been reviewed and updated.  Current Outpatient Medications on File Prior to Visit  Medication Sig Dispense Refill   ALPRAZolam  (XANAX ) 0.5 MG tablet TAKE 1/2 TO 1 TABLET BY MOUTH  TWICE DAILY AS NEEDED FOR  ANXIETY (Patient not taking: Reported on 10/24/2023) 60 tablet 1   apixaban  (ELIQUIS ) 5 MG TABS tablet TAKE 1 TABLET BY MOUTH TWICE  DAILY 200 tablet 0   BIOTIN PO Take 500 mg by mouth daily.     Calcium Carb-Cholecalciferol  (CALCIUM 600 + D PO) Take 1 tablet by mouth every evening. (Patient not taking: Reported on 10/24/2023)     Cholecalciferol  (VITAMIN D3) 50 MCG (2000 UT) capsule Take 2,000 Units by mouth daily. (Patient not taking: Reported on 10/24/2023)     clobetasol  cream (TEMOVATE ) 0.05 % Apply 1 Application topically  once a week. Use as needed for eczema and rash 60 g 1   cyanocobalamin  (VITAMIN B12) 1000 MCG tablet Take 1,000 mcg by mouth daily. (Patient not taking: Reported on 10/24/2023)     diltiazem (CARDIZEM CD) 180 MG 24 hr capsule Take 180 mg by mouth daily.     losartan (COZAAR) 25 MG tablet Take 25 mg by mouth 2 (two) times daily. (Patient not taking: Reported on 10/24/2023)     metoprolol  tartrate (LOPRESSOR ) 25 MG tablet TAKE 1 AND 1/2 TABLETS BY MOUTH  IN THE MORNING AND 1 TABLET BY  MOUTH IN THE EVENING (Patient taking differently: TAKE 1 AND 1/2 TABLETS BY MOUTH  IN THE MORNING AND 1 TABLET BY  MOUTH IN THE EVENING) 225 tablet 2   Multiple Vitamin (MULTIVITAMIN WITH MINERALS) TABS tablet Take 1  tablet by mouth every evening. (Patient not taking: Reported on 10/24/2023)     Multiple Vitamins-Minerals (PRESERVISION AREDS 2) CAPS Take 2 capsules by mouth at bedtime. (Patient not taking: Reported on 10/24/2023)     Omega 3 1200 MG CAPS Take 1,200 mg by mouth daily at 2 am. (Patient not taking: Reported on 10/24/2023)     traZODone  (DESYREL ) 50 MG tablet Take 0.5-1 tablets (25-50 mg total) by mouth at bedtime as needed for sleep. 30 tablet 3   vitamin C (ASCORBIC ACID) 500 MG tablet Take 500 mg by mouth daily. (Patient not taking: Reported on 10/24/2023)     VITAMIN E PO Take 90 mg by mouth daily. (Patient not taking: Reported on 10/24/2023)     No current facility-administered medications on file prior to visit.    Review of Systems:  As per HPI- otherwise negative.   Physical Examination: There were no vitals filed for this visit. There were no vitals filed for this visit. There is no height or weight on file to calculate BMI. Ideal Body Weight:    GEN: no acute distress. HEENT: Atraumatic, Normocephalic.  Ears and Nose: No external deformity. CV: RRR, No M/G/R. No JVD. No thrill. No extra heart sounds. PULM: CTA B, no wheezes, crackles, rhonchi. No retractions. No resp. distress. No accessory muscle use. ABD: S, NT, ND, +BS. No rebound. No HSM. EXTR: No c/c/e PSYCH: Normally interactive. Conversant.    Assessment and Plan: ***  Signed Gates Kasal, MD

## 2024-02-04 NOTE — Patient Instructions (Incomplete)
It was great to see you again today as always

## 2024-02-05 ENCOUNTER — Encounter

## 2024-02-05 ENCOUNTER — Ambulatory Visit (INDEPENDENT_AMBULATORY_CARE_PROVIDER_SITE_OTHER): Admitting: Family Medicine

## 2024-02-05 ENCOUNTER — Encounter: Payer: Self-pay | Admitting: Family Medicine

## 2024-02-05 ENCOUNTER — Ambulatory Visit (HOSPITAL_BASED_OUTPATIENT_CLINIC_OR_DEPARTMENT_OTHER)
Admission: RE | Admit: 2024-02-05 | Discharge: 2024-02-05 | Disposition: A | Discharge status: Home / Self Care | Source: Ambulatory Visit | Attending: Family Medicine | Admitting: Family Medicine

## 2024-02-05 VITALS — BP 124/72 | HR 85 | Ht 61.0 in

## 2024-02-05 DIAGNOSIS — I4891 Unspecified atrial fibrillation: Secondary | ICD-10-CM

## 2024-02-05 DIAGNOSIS — R051 Acute cough: Secondary | ICD-10-CM

## 2024-02-05 DIAGNOSIS — R059 Cough, unspecified: Secondary | ICD-10-CM | POA: Diagnosis not present

## 2024-02-05 MED ORDER — APIXABAN 2.5 MG PO TABS: 2.5000 mg | ORAL_TABLET | Freq: Two times a day (BID) | ORAL | 3 refills | Status: DC

## 2024-02-05 MED ORDER — DOXYCYCLINE HYCLATE 100 MG PO CAPS: 100.0000 mg | ORAL_CAPSULE | Freq: Two times a day (BID) | ORAL | 0 refills | Status: AC

## 2024-02-06 LAB — CBC
HCT: 42.6 % (ref 36.0–46.0)
Hemoglobin: 14.6 g/dL (ref 12.0–15.0)
MCHC: 34.2 g/dL (ref 30.0–36.0)
MCV: 94 fl (ref 78.0–100.0)
Platelets: 268 10*3/uL (ref 150.0–400.0)
RBC: 4.53 Mil/uL (ref 3.87–5.11)
RDW: 14 % (ref 11.5–15.5)
WBC: 7.7 10*3/uL (ref 4.0–10.5)

## 2024-02-07 ENCOUNTER — Encounter: Payer: Self-pay | Admitting: Family Medicine

## 2024-02-07 LAB — COMPREHENSIVE METABOLIC PANEL WITH GFR
ALT: 23 U/L (ref 0–35)
AST: 32 U/L (ref 0–37)
Albumin: 4.3 g/dL (ref 3.5–5.2)
Alkaline Phosphatase: 97 U/L (ref 39–117)
BUN: 24 mg/dL — ABNORMAL HIGH (ref 6–23)
CO2: 15 meq/L — ABNORMAL LOW (ref 19–32)
Calcium: 8.9 mg/dL (ref 8.4–10.5)
Chloride: 105 meq/L (ref 96–112)
Creatinine, Ser: 1.52 mg/dL — ABNORMAL HIGH (ref 0.40–1.20)
GFR: 30.38 mL/min — ABNORMAL LOW (ref >60.00)
Glucose, Bld: 132 mg/dL — ABNORMAL HIGH (ref 70–99)
Potassium: 4 meq/L (ref 3.5–5.1)
Sodium: 143 meq/L (ref 135–145)
Total Bilirubin: 0.5 mg/dL (ref 0.2–1.2)
Total Protein: 7 g/dL (ref 6.0–8.3)

## 2024-02-11 ENCOUNTER — Ambulatory Visit (INDEPENDENT_AMBULATORY_CARE_PROVIDER_SITE_OTHER)

## 2024-02-11 ENCOUNTER — Telehealth: Payer: Self-pay

## 2024-02-11 VITALS — Ht 61.0 in | Wt 134.0 lb

## 2024-02-11 DIAGNOSIS — R051 Acute cough: Secondary | ICD-10-CM

## 2024-02-11 DIAGNOSIS — Z0000 Encounter for general adult medical examination without abnormal findings: Secondary | ICD-10-CM

## 2024-02-11 MED ORDER — PREDNISONE 20 MG PO TABS: ORAL_TABLET | ORAL | 0 refills | Status: AC

## 2024-02-11 NOTE — Telephone Encounter (Signed)
 Patient seen for AWV and wanted to update provider that cough is getting no better. Still productive.  Still has 2 days left of ABT.

## 2024-02-11 NOTE — Patient Instructions (Signed)
 [name] , Thank you for taking time out of your busy schedule to complete your Annual Wellness Visit with me. I enjoyed our conversation and look forward to speaking with you again next year. I, as well as your care team,  appreciate your ongoing commitment to your health goals. Please review the following plan we discussed and let me know if I can assist you in the future. Your Game plan/ To Do List     Follow up Visits: Next Medicare AWV with our clinical staff: In 1 year    Have you seen your provider in the last 6 months (3 months if uncontrolled diabetes)? Yes Next Office Visit with your provider: To be scheduled   Clinician Recommendations:  Aim for 30 minutes of exercise or brisk walking, 6-8 glasses of water, and 5 servings of fruits and vegetables each day.       This is a list of the screening recommended for you and due dates:  Health Maintenance  Topic Date Due   Zoster (Shingles) Vaccine (1 of 2) Never done   COVID-19 Vaccine (7 - 2024-25 season) 05/05/2023   Mammogram  02/12/2024   Flu Shot  04/03/2024   Medicare Annual Wellness Visit  02/10/2025   DTaP/Tdap/Td vaccine (2 - Td or Tdap) 02/24/2029   Pneumonia Vaccine  Completed   DEXA scan (bone density measurement)  Completed   HPV Vaccine  Aged Out   Meningitis B Vaccine  Aged Out    Advanced directives: (ACP Link)Information on Advanced Care Planning can be found at [employer]  Best boy Advance Health Care Directives Advance Health Care Directives. [email or web address]   Advance Care Planning is important because it:  [x]  Makes sure you receive the medical care that is consistent with your values, goals, and preferences  [x]  It provides guidance to your family and loved ones and reduces their decisional burden about whether or not they are making the right decisions based on your wishes.  Follow the link provided in your after visit summary or read over the paperwork we have mailed to you to help you started  getting your Advance Directives in place. If you need assistance in completing these, please reach out to us  so that we can help you!  See attachments for Preventive Care and Fall Prevention Tips.

## 2024-02-11 NOTE — Telephone Encounter (Signed)
 Called pt.

## 2024-02-11 NOTE — Progress Notes (Signed)
 Subjective:   Wendy Hebert is a 89 y.o. who presents for a Medicare Wellness preventive visit.  As a reminder, Annual Wellness Visits don't include a physical exam, and some assessments may be limited, especially if this visit is performed virtually. We may recommend an in-person follow-up visit with your provider if needed.  Visit Complete: Virtual I connected with  Wendy Hebert on 02/11/24 by a audio enabled telemedicine application and verified that I am speaking with the correct person using two identifiers.  Patient Location: Home  Provider Location: Home Office  I discussed the limitations of evaluation and management by telemedicine. The patient expressed understanding and agreed to proceed.  Vital Signs: Because this visit was a virtual/telehealth visit, some criteria may be missing or patient reported. Any vitals not documented were not able to be obtained and vitals that have been documented are patient reported.  VideoDeclined- This patient declined Librarian, academic. Therefore the visit was completed with audio only.  Persons Participating in Visit: Patient.  AWV Questionnaire: No: Patient Medicare AWV questionnaire was not completed prior to this visit.  Cardiac Risk Factors include: advanced age (>19men, >1 women);hypertension     Objective:     Today's Vitals   02/11/24 0901  Weight: 134 lb (60.8 kg)  Height: 5\' 1"  (1.549 m)   Body mass index is 25.32 kg/m.     02/11/2024   10:02 AM 11/20/2022    3:42 PM 04/24/2022    9:59 AM 05/22/2021    3:09 PM 11/10/2018    9:26 AM 10/17/2018   11:32 PM  Advanced Directives  Does Patient Have a Medical Advance Directive? No Yes Yes Yes Yes Yes  Type of Special educational needs teacher of [place];Living will Healthcare Power of [place];Living will Healthcare Power of [place];Living will Healthcare Power of [employer] of [place];Living will  Does patient want to  make changes to medical advance directive?  No - Patient declined      Copy of Healthcare Power of Attorney in Chart?  No - copy requested  No - copy requested No - copy requested No - copy requested  Would patient like information on creating a medical advance directive? Yes (MAU/Ambulatory/Procedural Areas - Information given)     No - Patient declined    Current Medications (verified) Outpatient Encounter Medications as of 02/11/2024  Medication Sig   apixaban  (ELIQUIS ) 2.5 MG TABS tablet Take 1 tablet (2.5 mg total) by mouth 2 (two) times daily.   BIOTIN PO Take 500 mg by mouth daily.   clobetasol  cream (TEMOVATE ) 0.05 % Apply 1 Application topically once a week. Use as needed for eczema and rash   diltiazem (CARDIZEM CD) 180 MG 24 hr capsule Take 180 mg by mouth daily.   doxycycline  (VIBRAMYCIN ) 100 MG capsule Take 1 capsule (100 mg total) by mouth 2 (two) times daily.   metoprolol  tartrate (LOPRESSOR ) 25 MG tablet TAKE 1 AND 1/2 TABLETS BY MOUTH  IN THE MORNING AND 1 TABLET BY  MOUTH IN THE EVENING (Patient taking differently: TAKE 1 AND 1/2 TABLETS BY MOUTH  IN THE MORNING AND 1 TABLET BY  MOUTH IN THE EVENING)   traZODone  (DESYREL ) 50 MG tablet Take 0.5-1 tablets (25-50 mg total) by mouth at bedtime as needed for sleep.   cyanocobalamin  (VITAMIN B12) 1000 MCG tablet Take 1,000 mcg by mouth daily. (Patient not taking: Reported on 10/24/2023)   losartan (COZAAR) 25 MG tablet Take 25 mg by mouth  2 (two) times daily. (Patient not taking: Reported on 10/24/2023)   Multiple Vitamin (MULTIVITAMIN WITH MINERALS) TABS tablet Take 1 tablet by mouth every evening. (Patient not taking: Reported on 10/24/2023)   Multiple Vitamins-Minerals (PRESERVISION AREDS 2) CAPS Take 2 capsules by mouth at bedtime. (Patient not taking: Reported on 10/24/2023)   Omega 3 1200 MG CAPS Take 1,200 mg by mouth daily at 2 am. (Patient not taking: Reported on 10/24/2023)   vitamin C (ASCORBIC ACID) 500 MG tablet Take 500 mg by  mouth daily. (Patient not taking: Reported on 10/24/2023)   VITAMIN E PO Take 90 mg by mouth daily. (Patient not taking: Reported on 10/24/2023)   No facility-administered encounter medications on file as of 02/11/2024.    Allergies (verified) Patient has no known allergies.   History: Past Medical History:  Diagnosis Date   Arthritis    Atrial fibrillation (HCC)    Cancer (HCC) 2017   1.6 lumpectomy   Depression    Hypertension    Lichen sclerosus    Past Surgical History:  Procedure Laterality Date   APPENDECTOMY     BOWEL RESECTION     BREAST LUMPECTOMY  2017   CARDIOVERSION N/A 11/10/2018   Procedure: CARDIOVERSION;  Surgeon: Maudine Sos, MD;  Location: [facility] ENDOSCOPY;  Service: Cardiovascular;  Laterality: N/A;   CESAREAN SECTION     HERNIA REPAIR     LESION REMOVAL Right 04/24/2022   Procedure: WIDE LOCAL EXCISION RIGHT CALF SQUAMOUS CELL CANCER;  Surgeon: Lockie Rima, MD;  Location: [facility] OR;  Service: General;  Laterality: Right;   TONSILLECTOMY     Family History  Problem Relation Age of Onset   Cancer Father        Lung   Cancer Sister    Social History   Socioeconomic History   Marital status: Widowed    Spouse name: joe   Number of children: 6   Years of education: Not on file   Highest education level: Bachelor's degree (e.g., BA, AB, BS)  Occupational History   Occupation: Set designer    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 She drinks 4 cups of coffee and 4-5 glasses of tea a day.   Social Drivers of Research scientist (physical sciences) Strain: Low Risk  (02/11/2024)   Overall Financial Resource Strain (CARDIA)    Difficulty of Paying Living Expenses: Not hard at all  Food Insecurity: No Food Insecurity (02/11/2024)   Hunger Vital Sign    Worried About Running Out of Food in the Last Year: Never true    Ran Out of Food in the Last Year: Never true  Transportation Needs: No Transportation Needs (02/11/2024)   PRAPARE - Administrator, Civil Service (Medical): No    Lack of Transportation (Non-Medical): No  Physical Activity: Inactive (02/11/2024)   Exercise Vital Sign    Days of Exercise per Week: 0 days    Minutes of Exercise per Session: 0 min  Stress: No  Stress Concern Present (02/11/2024)   [employer] of Occupational Health - Occupational Stress Questionnaire    Feeling of Stress : Only a little  Social Connections: Moderately Integrated (02/11/2024)   Social Connection and Isolation Panel [NHANES]    Frequency of Communication with Friends and Family: Three times a week    Frequency of Social Gatherings with Friends and Family: Once a week    Attends Religious Services: More than 4 times per year    Active Member of [employer] or Organizations: Yes    Attends Banker Meetings: More than 4 times per year    Marital Status: Widowed    Tobacco Counseling Counseling given: Not Answered Tobacco comments: 30+ years    Clinical Intake:  Pre-visit preparation completed: Yes  Pain : No/denies pain     Diabetes: No  Lab Results  Component Value Date   HGBA1C 6.1 02/25/2019     How often do you need to have someone help you when you read instructions, pamphlets, or other written materials from your doctor or pharmacy?: 1 - Never  Interpreter Needed?: No  Information entered by :: Seabron Cypress LPN   Activities of Daily Living     02/11/2024   10:02 AM  In your present state of health, do you have any difficulty performing the following activities:  Hearing? 0   Vision? 0  Difficulty concentrating or making decisions? 0  Walking or climbing stairs? 1  Dressing or bathing? 0  Doing errands, shopping? 1  Preparing Food and eating ? N  Using the Toilet? N  In the past six months, have you accidently leaked urine? N  Do you have problems with loss of bowel control? N  Managing your Medications? N  Managing your Finances? N  Housekeeping or managing your Housekeeping? N    Patient Care Team: Copland, Skipper Dumas, MD as PCP - General (Family Medicine) Asa Lauth, NP (Inactive) as Nurse Practitioner (Nurse Practitioner) Magrinat, Rozella Cornfield, MD (Inactive) as Consulting Physician (Oncology) Ezzie Holstein, PA-C as Physician Assistant (Obstetrics and Gynecology) Alethia Huxley, MD as Referring Physician (Oncology) Ziolkowska, Aldona, MD as Consulting Physician (Internal Medicine) Annetta Killian, MD as Consulting Physician (Surgical Oncology)  I have updated your Care Teams any recent Medical Services you may have received from other providers in the past year.     Assessment:    This is a routine wellness examination for Wendy Hebert.  Hearing/Vision screen Hearing Screening - Comments:: Denies hearing difficulties   Vision Screening - Comments:: Wears rx glasses - up to date with routine eye exams with Dr. Deetta Farrow    Goals Addressed             This Visit's Progress    Patient Stated   On track    Drink more water       Depression Screen     02/11/2024   10:01 AM 11/20/2022    3:56 PM 01/31/2022    3:19 PM 05/22/2021    3:14 PM 11/03/2020    2:31 PM 03/20/2018    2:03 PM  PHQ 2/9 Scores  PHQ - 2 Score 0 0 0 1 0 2    Fall Risk     02/11/2024   10:02 AM 11/20/2022    3:49 PM 01/31/2022    3:19 PM 05/22/2021    3:11 PM 03/15/2021    3:28 PM  Fall Risk   Falls in the past year? 0 1 0 0 0  Number falls  in past yr: 0 1 0 0 1  Injury with Fall? 0 0 0 0 1  Risk for fall due to : Impaired mobility;Impaired balance/gait Impaired  balance/gait;History of fall(s)   Impaired balance/gait  Follow up Falls prevention discussed;Education provided;Falls evaluation completed Falls evaluation completed  Falls prevention discussed Falls evaluation completed    MEDICARE RISK AT HOME:  Medicare Risk at Home Any stairs in or around the home?: Yes If so, are there any without handrails?: No Home free of loose throw rugs in walkways, pet beds, electrical cords, etc?: Yes Adequate lighting in your home to reduce risk of falls?: Yes Life alert?: No Use of a cane, walker or w/c?: Yes Grab bars in the bathroom?: Yes Shower chair or bench in shower?: No Elevated toilet seat or a handicapped toilet?: Yes  TIMED UP AND GO:  Was the test performed?  No  Cognitive Function: 6CIT completed    11/20/2022    4:18 PM  MMSE - Mini Mental State Exam  Not completed: Unable to complete        02/11/2024   10:02 AM  6CIT Screen  What Year? 0 points  What month? 0 points  What time? 0 points  Count back from 20 0 points  Months in reverse 2 points  Repeat phrase 0 points  Total Score 2 points    Immunizations Immunization History  Administered Date(s) Administered   Fluad Quad(high Dose 65+) 06/06/2019, 05/28/2022   Influenza Split 05/31/2021   Influenza, High Dose Seasonal PF 06/26/2017, 05/30/2018, 06/06/2019   Influenza, Seasonal, Injecte, Preservative Fre 05/30/2018   Influenza-Unspecified 05/30/2018, 06/06/2019, 06/23/2020   PFIZER(Purple Top)SARS-COV-2 Vaccination 09/17/2019, 10/08/2019, 06/16/2020, 12/09/2020   PNEUMOCOCCAL CONJUGATE-20 08/08/2022   Pfizer Covid-19 Vaccine Bivalent Booster 47yrs & up 05/31/2021   Pneumococcal Polysaccharide-23 08/25/1987, 09/03/2012   Respiratory Syncytial Virus Vaccine,Recomb Aduvanted(Arexvy) 06/17/2022   Tdap 02/25/2019   Unspecified SARS-COV-2 Vaccination 06/10/2022    Screening Tests Health Maintenance  Topic Date Due   Zoster Vaccines- Shingrix (1 of 2) Never done    COVID-19 Vaccine (7 - 2024-25 season) 05/05/2023   MAMMOGRAM  02/12/2024   INFLUENZA VACCINE  04/03/2024   Medicare Annual Wellness (AWV)  02/10/2025   DTaP/Tdap/Td (2 - Td or Tdap) 02/24/2029   Pneumonia Vaccine 57+ Years old  Completed   DEXA SCAN  Completed   HPV VACCINES  Aged Out   Meningococcal B Vaccine  Aged Out    Health Maintenance  Health Maintenance Due  Topic Date Due   Zoster Vaccines- Shingrix (1 of 2) Never done   COVID-19 Vaccine (7 - 2024-25 season) 05/05/2023    Additional Screening:  Vision Screening: Recommended annual ophthalmology exams for early detection of glaucoma and other disorders of the eye. Would you like a referral to an eye doctor? No    Dental Screening: Recommended annual dental exams for proper oral hygiene  Community Resource Referral / Chronic Care Management: CRR required this visit?  No   CCM required this visit?  No   Plan:    I have personally reviewed and noted the following in the patient's chart:   Medical and social history Use of alcohol, tobacco or illicit drugs  Current medications and supplements including opioid prescriptions. Patient is not currently taking opioid prescriptions. Functional ability and status Nutritional status Physical activity Advanced directives List of other physicians Hospitalizations, surgeries, and ER visits in previous 12 months Vitals Screenings to include cognitive, depression, and falls Referrals and appointments  In addition,  I have reviewed and discussed with patient certain preventive protocols, quality metrics, and best practice recommendations. A written personalized care plan for preventive services as well as general preventive health recommendations were provided to patient.   Seabron Cypress [place], [place]   9/62/9528   After Visit Summary: (MyChart) Due to this being a telephonic visit, the after visit summary with patients personalized plan was offered to patient via  MyChart   Notes: Nothing significant to report at this time.

## 2024-02-12 ENCOUNTER — Telehealth: Payer: Self-pay | Admitting: Family Medicine

## 2024-02-12 NOTE — Telephone Encounter (Signed)
  FYI Only or Action Required?: FYI only for provider  Patient was last seen in primary care on 02/05/2024 by Copland, Skipper Dumas, MD. Called Nurse Triage reporting Advice Only. Symptoms began today. Interventions attempted: Prescription medications: wanted to make sure to take the doxycycline  and Deltasone  together. Symptoms are: unchanged.  Triage Disposition: No disposition on file.  Patient/caregiver understands and will follow disposition?:    Copied from CRM [id number]. Topic: Clinical - Medication Question >> Feb 12, 2024 11:19 AM Alyse July wrote: Reason for CRM: Patient would like to know if she should continue to take doxycycline  (VIBRAMYCIN ) 100 MG capsule in addition to her predniSONE  (DELTASONE ) 20 MG tablet. Please contact patient to advise CB# [phone]  This RN instructed her to take both medications together.

## 2024-02-18 ENCOUNTER — Encounter: Payer: Self-pay | Admitting: Family Medicine

## 2024-02-18 ENCOUNTER — Ambulatory Visit: Payer: Self-pay

## 2024-02-18 ENCOUNTER — Ambulatory Visit (INDEPENDENT_AMBULATORY_CARE_PROVIDER_SITE_OTHER): Admitting: Family Medicine

## 2024-02-18 VITALS — BP 122/72 | HR 78 | Temp 97.5°F | Resp 16 | Ht 61.0 in | Wt 134.0 lb

## 2024-02-18 DIAGNOSIS — K5900 Constipation, unspecified: Secondary | ICD-10-CM | POA: Diagnosis not present

## 2024-02-18 NOTE — Progress Notes (Signed)
 Chief Complaint  Patient presents with   Abdominal Pain    Abdominal Pain and Conspiation     Wendy Hebert is here for abdominal pain.  Duration: 2 months dealing w constipation; pain started last night Nighttime awakenings? No Bleeding? No Weight loss? No Palliation: none Provocation: none Associated symptoms: constipation Had a small BM after lunch that did improve s/s's Denies: fever, nausea, and vomiting Treatment to date: enema  Past Medical History:  Diagnosis Date   Arthritis    Atrial fibrillation (HCC)    Cancer (HCC) 2017   1.6 lumpectomy   Depression    Hypertension    Lichen sclerosus     BP 122/72 (BP Location: Left Arm, Patient Position: Sitting)   Pulse 78   Temp (!) 97.5 F (36.4 C) (Oral)   Resp 16   Ht 5' 1 (1.549 m)   Wt 134 lb (60.8 kg)   SpO2 98%   BMI 25.32 kg/m  Gen.: Awake, alert, appears stated age HEENT: Mucous membranes moist without mucosal lesions Heart: Regular rate and rhythm without murmurs Lungs: Clear auscultation bilaterally, no rales or wheezing, normal effort without accessory muscle use. Abdomen: Bowel sounds are present. Abdomen is soft, mild ttp in the lower quadrants, nondistended, no masses or organomegaly. Negative Murphy's, Rovsing's, McBurney's, and Carnett's sign. Psych: Age appropriate judgment and insight. Normal mood and affect.  Constipation, unspecified constipation type  Stay hydrated. Fiber supp rec'd w water intake. Milk of Mg + prune juice rec'd. Enema or suppository if that does not work. No s/s's of SBO.  F/u prn Pt voiced understanding and agreement to the plan.  Shellie Dials Bradford, DO 02/18/24 2:55 PM

## 2024-02-18 NOTE — Patient Instructions (Addendum)
 Try to drink 55-60 oz of water daily outside of exercise.  Take Metamucil or Benefiber daily.  Try 2 tablespoons of milk of mag in 4 oz of warm prune juice. Do that and wait a couple hours. If no improvement, try a Dulcolax suppository or enema and then let me know if we are still having issues.   Let us  know if you need anything.

## 2024-02-18 NOTE — Telephone Encounter (Signed)
 FYI Only or Action Required?: FYI only for provider  Patient was last seen in primary care on 02/05/2024 by Copland, Skipper Dumas, MD. Called Nurse Triage reporting Constipation. Symptoms began several months ago. Interventions attempted: OTC medications: stool softener, laxatives (thinks Miralax). Symptoms are: constipation, abdominal pain gradually worsening.  Triage Disposition: See HCP Within 4 Hours (Or PCP Triage)  Patient/caregiver understands and will follow disposition?: Yes                       Constipation on and off x 6 weeks    Reason for Disposition  [1] Constant abdominal pain AND [2] present > 2 hours  Answer Assessment - Initial Assessment Questions 1. STOOL PATTERN OR FREQUENCY: How often do you have a bowel movement (BM)?  (Normal range: 3 times a day to every 3 days)  When was your last BM?       About every 2 weeks. Last bowel movement 6-7 day ago, was the size of half a baseball.  2. STRAINING: Do you have to strain to have a BM?      Yes.  3. RECTAL PAIN: Does your rectum hurt when the stool comes out? If Yes, ask: Do you have hemorrhoids? How bad is the pain?  (Scale 1-10; or mild, moderate, severe)     No rectal pain. No hemorrhoids.  4. STOOL COMPOSITION: Are the stools hard?      Yes.  5. BLOOD ON STOOLS: Has there been any blood on the toilet tissue or on the surface of the BM? If Yes, ask: When was the last time?     No.  6. CHRONIC CONSTIPATION: Is this a new problem for you?  If No, ask: How long have you had this problem? (days, weeks, months)      No, she states it has been about 2 months.  7. CHANGES IN DIET OR HYDRATION: Have there been any recent changes in your diet? How much fluids are you drinking on a daily basis?  How much have you had to drink today?     She states she doesn't like water so she doesn't drink enough. She states she doesn't eat a lot of leafy greens.  8. MEDICINES: Have you been  taking any new medicines? Are you taking any narcotic pain medicines? (e.g., Dilaudid, morphine , Percocet, Vicodin)     No iron pills or narcotics. She states she was on doxycycline  and prednisone  as new medications.  9. LAXATIVES: Have you been using any stool softeners, laxatives, or enemas?  If Yes, ask What, how often, and when was the last time?     She states she has stool softeners but last time she took it was about a month ago. She took 2 laxatives on different days (she thinks it might have been Miralax).  10. ACTIVITY:  How much walking do you do every day?  Has your activity level decreased in the past week?        Not a whole lot of walking daily, she states she is physically impaired. She states she walks around her house about 2-3 times daily. No decreases in her activity level.  11. CAUSE: What do you think is causing the constipation?        Diet related, not enough leafy greens or water.  12. OTHER SYMPTOMS: Do you have any other symptoms? (e.g., abdomen pain, bloating, fever, vomiting)       Abdominal pain (not severe, constant)  13.  MEDICAL HISTORY: Do you have a history of hemorrhoids, rectal fissures, or rectal surgery or rectal abscess?         She states about 30 years ago she developed adhesions from a surgery and she had a kink in her bowel.  14. PREGNANCY: Is there any chance you are pregnant? When was your last menstrual period?       N/A.   Patient unsure about any abdominal bloating, denies any nausea/vomiting/fever.  Protocols used: Constipation-A-AH

## 2024-02-25 ENCOUNTER — Other Ambulatory Visit: Payer: Self-pay | Admitting: Family Medicine

## 2024-03-09 ENCOUNTER — Other Ambulatory Visit: Payer: Self-pay | Admitting: Family Medicine

## 2024-03-09 DIAGNOSIS — H353231 Exudative age-related macular degeneration, bilateral, with active choroidal neovascularization: Secondary | ICD-10-CM | POA: Diagnosis not present

## 2024-03-09 MED ORDER — TRAZODONE HCL 50 MG PO TABS: 25.0000 mg | ORAL_TABLET | Freq: Every evening | ORAL | 0 refills | Status: AC | PRN

## 2024-03-09 NOTE — Telephone Encounter (Signed)
 Copied from CRM [id number]. Topic: Clinical - Medication Refill >> Mar 09, 2024 11:54 AM Turkey A wrote: Medication: traZODone  (DESYREL ) 50 MG tablet  Has the patient contacted their pharmacy? No (Agent: If no, request that the patient contact the pharmacy for the refill. If patient does not wish to contact the pharmacy document the reason why and proceed with request.) (Agent: If yes, when and what did the pharmacy advise?)  This is the patient's preferred pharmacy:   [facility] [place] - [address] [place] [address] Phone: [phone] Fax: [phone]  Is this the correct pharmacy for this prescription? Yes If no, delete pharmacy and type the correct one.   Has the prescription been filled recently? No  Is the patient out of the medication? Yes  Has the patient been seen for an appointment in the last year OR does the patient have an upcoming appointment? Yes  Can we respond through MyChart? Yes  Agent: Please be advised that Rx refills may take up to 3 business days. We ask that you follow-up with your pharmacy.

## 2024-03-25 ENCOUNTER — Telehealth: Payer: Self-pay

## 2024-03-25 DIAGNOSIS — F411 Generalized anxiety disorder: Secondary | ICD-10-CM

## 2024-03-25 NOTE — Telephone Encounter (Signed)
 Copied from CRM #[id number]. Topic: Clinical - Medication Question >> Mar 25, 2024 12:06 PM Thersia BROCKS wrote: Reason for CRM: Patient called in stated she wanted to know if Dr.Copland could prescribe her a prescription for her anxiety, would like for her or her nurses to give her a callback

## 2024-03-25 NOTE — Telephone Encounter (Signed)
 Called her back- no answer right now,  will try again

## 2024-03-26 MED ORDER — FLUOXETINE HCL 10 MG PO TABS: 10.0000 mg | ORAL_TABLET | Freq: Every day | ORAL | 1 refills | Status: DC

## 2024-03-26 NOTE — Telephone Encounter (Signed)
 Gave her a call on 7/24.  Pt notes she has been feeling really stressed out.  Her son lost his job which is really stressful for them  She is sleeping ok and is taking her trazodone  still  She does not feel like she is depressed Will add fluoxetine  10 mg to her regimen; would like to avoid any medications that are likely to increase fall risk, she is 57 and lives alone Pt states understanding and agreement, she will let me know how this works for her over the next couple of weeks

## 2024-03-27 DIAGNOSIS — R9389 Abnormal findings on diagnostic imaging of other specified body structures: Secondary | ICD-10-CM | POA: Diagnosis not present

## 2024-03-30 MED ORDER — ALPRAZOLAM 0.25 MG PO TABS: 0.2500 mg | ORAL_TABLET | Freq: Two times a day (BID) | ORAL | 0 refills | Status: AC | PRN

## 2024-03-30 NOTE — Addendum Note (Signed)
 Addended by: WATT RAISIN C on: 03/30/2024 12:35 PM   Modules accepted: Orders

## 2024-03-30 NOTE — Telephone Encounter (Signed)
 I called pt- she is upset about son losing his job and an issue with her neighbor.  I sent in a small amount of xanax  which she has used in the past   Meds ordered this encounter  Medications   FLUoxetine  (PROZAC ) 10 MG tablet    Sig: Take 1 tablet (10 mg total) by mouth daily.    Dispense:  90 tablet    Refill:  1   ALPRAZolam  (XANAX ) 0.25 MG tablet    Sig: Take 1 tablet (0.25 mg total) by mouth 2 (two) times daily as needed for anxiety.    Dispense:  20 tablet    Refill:  0

## 2024-03-30 NOTE — Telephone Encounter (Unsigned)
 Copied from CRM [id number]. Topic: Clinical - Medical Advice >> Mar 30, 2024 11:56 AM Laymon HERO wrote: Reason for CRM: Patient called in very agitated, stating that was Prozac  was not working. She said she needs something that it going to help her immediately today, Wanting to Speak to Dr Watt as soon as she can. She refused to speak to a triage nurse.

## 2024-04-13 DIAGNOSIS — H353231 Exudative age-related macular degeneration, bilateral, with active choroidal neovascularization: Secondary | ICD-10-CM | POA: Diagnosis not present

## 2024-04-27 DIAGNOSIS — R0600 Dyspnea, unspecified: Secondary | ICD-10-CM | POA: Diagnosis not present

## 2024-04-27 DIAGNOSIS — K5521 Angiodysplasia of colon with hemorrhage: Secondary | ICD-10-CM | POA: Diagnosis not present

## 2024-04-27 DIAGNOSIS — I4891 Unspecified atrial fibrillation: Secondary | ICD-10-CM | POA: Diagnosis not present

## 2024-05-06 DIAGNOSIS — R0600 Dyspnea, unspecified: Secondary | ICD-10-CM | POA: Diagnosis not present

## 2024-05-06 DIAGNOSIS — I4891 Unspecified atrial fibrillation: Secondary | ICD-10-CM | POA: Diagnosis not present

## 2024-05-06 DIAGNOSIS — K5521 Angiodysplasia of colon with hemorrhage: Secondary | ICD-10-CM | POA: Diagnosis not present

## 2024-05-15 ENCOUNTER — Other Ambulatory Visit: Payer: Self-pay | Admitting: Family Medicine

## 2024-05-15 DIAGNOSIS — I4891 Unspecified atrial fibrillation: Secondary | ICD-10-CM

## 2024-05-27 DIAGNOSIS — H353231 Exudative age-related macular degeneration, bilateral, with active choroidal neovascularization: Secondary | ICD-10-CM | POA: Diagnosis not present

## 2024-06-02 ENCOUNTER — Encounter: Payer: Self-pay | Admitting: Family Medicine

## 2024-06-22 ENCOUNTER — Telehealth: Payer: Self-pay

## 2024-06-22 DIAGNOSIS — F411 Generalized anxiety disorder: Secondary | ICD-10-CM

## 2024-06-22 MED ORDER — FLUOXETINE HCL 10 MG PO TABS: 10.0000 mg | ORAL_TABLET | Freq: Every day | ORAL | 1 refills | Status: AC

## 2024-06-22 NOTE — Telephone Encounter (Signed)
 Rx sent.

## 2024-06-22 NOTE — Telephone Encounter (Signed)
 Copied from CRM #[id number]. Topic: Clinical - Prescription Issue >> Jun 22, 2024 11:51 AM Aleatha C wrote: Reason for CRM: Patient wants to know if she still should be taking the  FLUoxetine  (PROZAC ) 10 MG tablet, and if she should be getting a refill and if so to have it called [facility] - [place], [place] - [address] [place] [address] Phone: [phone] Fax: [phone] Hours: Not open 24 hours   And to call her to confirm doesn't access to mychart right now

## 2024-06-30 ENCOUNTER — Encounter: Payer: Self-pay | Admitting: Family Medicine

## 2024-06-30 DIAGNOSIS — M2550 Pain in unspecified joint: Secondary | ICD-10-CM

## 2024-06-30 NOTE — Addendum Note (Signed)
 Addended by: WATT RAISIN C on: 06/30/2024 08:35 PM   Modules accepted: Orders

## 2024-07-19 NOTE — Progress Notes (Addendum)
 [facility] [address], [address] [place], [place] [address] [phone]  Date:  07/23/2024   Name:  Wendy Hebert   DOB:  1933-05-23   MRN:  [id number]  PCP:  Watt Harlene BROCKS, MD    Chief Complaint: Follow-up (To discuss arthritis radiation therapy )   History of Present Illness:  Wendy Hebert is a 89 y.o. very pleasant female patient who presents with the following:  Patient here today to discuss the possibility of radiation treatment for arthritis-it looks like this is something offered through radiation oncology  History of breast cancer, atrial fibrillation on Eliquis , lichen sclerosis, SSC on her leg with delayed healing after surgical resection   Discussed the use of AI scribe software for clinical note transcription with the patient, who gave verbal consent to proceed.  History of Present Illness Wendy Hebert is an 89 year old female who presents with concerns about arthritis and potential radiation treatment. (Our health system has recently ran some advertisements regarding low-dose radiation for osteoarthritis) She is considering radiation treatment for arthritis and has been informed about a low-dose radiation treatment used mostly in [place]. She is concerned about the potential cost if not covered by insurance.  She experiences mobility issues, particularly in her legs, and suspects arthritis in her knees. She has episodes of joint pain every three to five years but does not have consistent hip, ankle, or foot pain. She experiences itching in her ankles and arms from the elbow down, which she attributes to past reactions to medications like elastazol and letrozole.  She has a history of constipation followed by diarrhea after taking laxatives, lasting over a week. She reports feeling progressively better since Monday and no longer feels nauseous.  She has received her flu, COVID, and RSV vaccinations  and is up to date with her bone graft shots. She has decreased her salt intake and now only gets up twice a night to urinate.    Patient Active Problem List   Diagnosis Date Noted   Osteopenia 08/25/2019   Malignant neoplasm of upper-inner quadrant of right breast in female, estrogen receptor positive (HCC) 02/16/2019   Atrial fibrillation (HCC) 10/01/2018   Gait difficulty 03/20/2018   Lichen sclerosus 03/20/2018    Past Medical History:  Diagnosis Date   Arthritis    Atrial fibrillation (HCC)    Cancer (HCC) 2017   1.6 lumpectomy   Depression    Hypertension    Lichen sclerosus     Past Surgical History:  Procedure Laterality Date   APPENDECTOMY     BOWEL RESECTION     BREAST LUMPECTOMY  2017   CARDIOVERSION N/A 11/10/2018   Procedure: CARDIOVERSION;  Surgeon: Raford Riggs, MD;  Location: [facility] ENDOSCOPY;  Service: Cardiovascular;  Laterality: N/A;   CESAREAN SECTION     HERNIA REPAIR     LESION REMOVAL Right 04/24/2022   Procedure: WIDE LOCAL EXCISION RIGHT CALF SQUAMOUS CELL CANCER;  Surgeon: Aron Shoulders, MD;  Location: [facility] OR;  Service: General;  Laterality: Right;   TONSILLECTOMY      Social History   Tobacco Use   Smoking status: Former   Smokeless tobacco: Never   Tobacco comments:    30+ years  Vaping Use   Vaping status: Never Used  Substance Use Topics   Alcohol use: Not Currently    Comment: occasionally   Drug use: Never    Family History  Problem Relation  Age of Onset   Cancer Father        Lung   Cancer Sister     No Known Allergies  Medication list has been reviewed and updated.  Current Outpatient Medications on File Prior to Visit  Medication Sig Dispense Refill   apixaban  (ELIQUIS ) 2.5 MG TABS tablet Take 1 tablet (2.5 mg total) by mouth 2 (two) times daily. 180 tablet 3   clobetasol  cream (TEMOVATE ) 0.05 % Apply 1 Application topically once a week. Use as needed for eczema and rash 60 g 1   diltiazem (CARDIZEM CD) 180 MG 24 hr  capsule Take 180 mg by mouth daily.     ALPRAZolam  (XANAX ) 0.25 MG tablet Take 1 tablet (0.25 mg total) by mouth 2 (two) times daily as needed for anxiety. 20 tablet 0   BIOTIN PO Take 500 mg by mouth daily.     cyanocobalamin  (VITAMIN B12) 1000 MCG tablet Take 1,000 mcg by mouth daily. (Patient not taking: Reported on 07/23/2024)     doxycycline  (VIBRAMYCIN ) 100 MG capsule Take 1 capsule (100 mg total) by mouth 2 (two) times daily. 20 capsule 0   FLUoxetine  (PROZAC ) 10 MG tablet Take 1 tablet (10 mg total) by mouth daily. 90 tablet 1   losartan (COZAAR) 25 MG tablet Take 25 mg by mouth 2 (two) times daily. (Patient not taking: Reported on 10/24/2023)     metoprolol  tartrate (LOPRESSOR ) 25 MG tablet Take 1.5 tablets (37.5 mg total) by mouth in the morning AND 1 tablet (25 mg total) every evening. 225 tablet 0   Multiple Vitamin (MULTIVITAMIN WITH MINERALS) TABS tablet Take 1 tablet by mouth every evening. (Patient not taking: Reported on 10/24/2023)     Multiple Vitamins-Minerals (PRESERVISION AREDS 2) CAPS Take 2 capsules by mouth at bedtime. (Patient not taking: Reported on 10/24/2023)     Omega 3 1200 MG CAPS Take 1,200 mg by mouth daily at 2 am. (Patient not taking: Reported on 10/24/2023)     predniSONE  (DELTASONE ) 20 MG tablet Take 20 mg by mouth daily for 3-5 days 5 tablet 0   traZODone  (DESYREL ) 50 MG tablet Take 0.5-1 tablets (25-50 mg total) by mouth at bedtime as needed for sleep. 90 tablet 0   vitamin C (ASCORBIC ACID) 500 MG tablet Take 500 mg by mouth daily. (Patient not taking: Reported on 10/24/2023)     VITAMIN E PO Take 90 mg by mouth daily. (Patient not taking: Reported on 10/24/2023)     No current facility-administered medications on file prior to visit.    Review of Systems:  As per HPI- otherwise negative.    Physical Examination: Vitals:   07/23/24 1323  BP: 124/72  Pulse: 69  SpO2: 92%   Vitals:   07/23/24 1323  Weight: 131 lb (59.4 kg)  Height: 5' 1 (1.549 m)    Body mass index is 24.75 kg/m. Ideal Body Weight: Weight in (lb) to have BMI = 25: 132  GEN: no acute distress.  Looks well, seated in clinic provided wheelchair.  Appears her normal self HEENT: Atraumatic, Normocephalic.  Ears and Nose: No external deformity. CV: RRR, No M/G/R. No JVD. No thrill. No extra heart sounds. PULM: CTA B, no wheezes, crackles, rhonchi. No retractions. No resp. distress. No accessory muscle use. ABD: S, NT, ND EXTR: No c/c/e PSYCH: Normally interactive. Conversant.     Assessment and Plan: Constipation, unspecified constipation type  Atrial fibrillation, unspecified type (HCC)  Generalized joint pain  Assessment & Plan Suspected  osteoarthritis of knees Suspected osteoarthritis affecting mobility. Interested in low-dose radiation therapy which has been advertised locally-we discussed limited evidence and uncertain insurance coverage. Low risk due to age and radiation dose. - I offered a referral to radiation oncology for evaluation of radiation therapy.  She wants to check on her insurance coverage first.  I also advised we likely would need to get some plain films of her joints first to show that she has arthritis before making a referral  Constipation Recent constipation episode resolved after laxative use, followed by diarrhea.  She is now back at baseline.  She will let me know if any other concerns  General Health Maintenance Vaccinations up to date including flu, COVID, and RSV.   She continues to take Eliquis  for atrial fibrillation.  No complications noted of medication.  I was able to give her a few weeks worth of samples to ease her financial burden  Signed Harlene Schroeder, MD

## 2024-07-23 ENCOUNTER — Ambulatory Visit (INDEPENDENT_AMBULATORY_CARE_PROVIDER_SITE_OTHER): Admitting: Family Medicine

## 2024-07-23 ENCOUNTER — Encounter: Payer: Self-pay | Admitting: Family Medicine

## 2024-07-23 VITALS — BP 124/72 | HR 69 | Ht 61.0 in | Wt 131.0 lb

## 2024-07-23 DIAGNOSIS — K5900 Constipation, unspecified: Secondary | ICD-10-CM | POA: Diagnosis not present

## 2024-07-23 DIAGNOSIS — M2550 Pain in unspecified joint: Secondary | ICD-10-CM | POA: Diagnosis not present

## 2024-07-23 DIAGNOSIS — I4891 Unspecified atrial fibrillation: Secondary | ICD-10-CM | POA: Diagnosis not present

## 2024-08-11 ENCOUNTER — Telehealth: Payer: Self-pay

## 2024-08-11 DIAGNOSIS — M2550 Pain in unspecified joint: Secondary | ICD-10-CM

## 2024-08-11 NOTE — Telephone Encounter (Signed)
 Copied from CRM [id number]. Topic: General - Other >> Aug 11, 2024 10:58 AM Mercedes MATSU wrote: Reason for CRM: Patient called in asking to send a message to Dr. Ubaldo, she said she is older and her hearing is really bad. Every time she calls her insurance she can not understand the rep. She said dr. ubaldo told her to find out if insurance would cover low dosage radiation treatment for arthritis. She said her insurance would cover it, her part C. Patient states she can be reached at [phone].

## 2024-08-26 NOTE — Progress Notes (Signed)
 Site of osteoarthritis: Bilateral knees, and right shoulder.  How long have you had pain? She has experienced sx for years resulting in issues with mobility. She has pain flares every few years.   What over the counter or prescription medications have you tried? She reports no.  Does anything make the pain better or worse?      Harlene Schroeder, MD  Ambulatory status? Walker? Wheelchair?: Ambulatory with Assistance  SAFETY ISSUES: Prior radiation? No Pacemaker/ICD? No Possible current pregnancy? No Is the patient on methotrexate? No  Current Complaints / other details:    BP 116/77 (BP Location: Right Arm, Patient Position: Sitting, Cuff Size: Large)   Pulse 68   Temp (!) 97.5 F (36.4 C)   Resp (!) 24   Ht 5' 1 (1.549 m)   Wt 133 lb 9.6 oz (60.6 kg)   SpO2 98%   BMI 25.24 kg/m

## 2024-08-26 NOTE — Progress Notes (Signed)
 " Radiation Oncology         [phone] ________________________________  Name: Wendy Hebert        MRN: [id number]  Date of Service: 08/31/2024 DOB: 12-30-1932  RR:Rneojwi, Harlene BROCKS, MD  Copland, Harlene BROCKS, MD     REFERRING PHYSICIAN: Copland, Harlene BROCKS, MD  DIAGNOSIS: The primary encounter diagnosis was Primary osteoarthritis of both knees. A diagnosis of Primary osteoarthritis of right shoulder was also pertinent to this visit. M17.0, M19.011    HISTORY OF PRESENT ILLNESS: Wendy Hebert is a 89 y.o. female seen in consultation for radiation therapy.  She has a hx of multijoint arthritis, most pronounced and symptomatic over the last 5-10 years. The joints most notably impacted include the bilateral knees and R shoulder. She has pain flares every so often that result pain that is up to a 7/10 in severity. The pain prevents her from leaving her home. She likes to be active and go out but is limited due to lack of mobility and pain in her knees. She went to a basketball game yesterday and had to use a wheelchair. She tries to avoid walking due to pain. She is using a cane here in the hospital. The cane doesn't  help with pain but rather with her balance. Tylenol  isn't effective. Naproxen helps somewhat. She's tried steroid injections that were minimally beneficial.   Her R shoulder is also very symptomatic. It is hard for her to reach and pick up items due to the pain and limited mobility. When laying down to sleep she has to make sure her R shoulder is positioned very carefully so as not to cause too much pain.   She's had imaging of the bilateral knees and R shoulder demonstrating advanced degenerative disease. A 2021 R knee x-ray demonstrated advanced tricompartmental degenerative disease with bone on bone medial compartment, peripheral osteophytes and subchondral sclerosis. 2020 imaging of the L knee also revealed significant degenerative arthritis. 2019 imaging of the R shoulder  showed significant glenohumeral joint degenerative arthritis with large inferior humeral head osteophyte formation.    PREVIOUS RADIATION THERAPY: No  AUTOIMMUNE DISEASE: No  MEDICAL DEVICES: No  PREGNANCY: No    PAST MEDICAL HISTORY:  Past Medical History:  Diagnosis Date   Arthritis    Atrial fibrillation (HCC)    Cancer (HCC) 2017   1.6 lumpectomy   Depression    Hypertension    Lichen sclerosus        PAST SURGICAL HISTORY: Past Surgical History:  Procedure Laterality Date   APPENDECTOMY     BOWEL RESECTION     BREAST LUMPECTOMY  2017   CARDIOVERSION N/A 11/10/2018   Procedure: CARDIOVERSION;  Surgeon: Raford Riggs, MD;  Location: [facility] ENDOSCOPY;  Service: Cardiovascular;  Laterality: N/A;   CESAREAN SECTION     HERNIA REPAIR     LESION REMOVAL Right 04/24/2022   Procedure: WIDE LOCAL EXCISION RIGHT CALF SQUAMOUS CELL CANCER;  Surgeon: Aron Shoulders, MD;  Location: [facility] OR;  Service: General;  Laterality: Right;   TONSILLECTOMY       FAMILY HISTORY:  Family History  Problem Relation Age of Onset   Cancer Father        Lung   Cancer Sister      SOCIAL HISTORY:  reports that she has quit smoking. She has never used smokeless tobacco. She reports that she does not currently use alcohol. She reports that she does not use drugs.   ALLERGIES: Patient has no known  allergies.   MEDICATIONS:  Current Outpatient Medications  Medication Sig Dispense Refill   ALPRAZolam  (XANAX ) 0.25 MG tablet Take 1 tablet (0.25 mg total) by mouth 2 (two) times daily as needed for anxiety. 20 tablet 0   apixaban  (ELIQUIS ) 2.5 MG TABS tablet Take 1 tablet (2.5 mg total) by mouth 2 (two) times daily. 180 tablet 3   BIOTIN PO Take 500 mg by mouth daily.     clobetasol  cream (TEMOVATE ) 0.05 % Apply 1 Application topically once a week. Use as needed for eczema and rash 60 g 1   diltiazem (CARDIZEM CD) 180 MG 24 hr capsule Take 180 mg by mouth daily.     FLUoxetine  (PROZAC ) 10 MG  tablet Take 1 tablet (10 mg total) by mouth daily. 90 tablet 1   losartan (COZAAR) 25 MG tablet Take 25 mg by mouth 2 (two) times daily.     cyanocobalamin  (VITAMIN B12) 1000 MCG tablet Take 1,000 mcg by mouth daily. (Patient not taking: Reported on 08/31/2024)     doxycycline  (VIBRAMYCIN ) 100 MG capsule Take 1 capsule (100 mg total) by mouth 2 (two) times daily. (Patient not taking: Reported on 08/31/2024) 20 capsule 0   metoprolol  tartrate (LOPRESSOR ) 25 MG tablet Take 1.5 tablets (37.5 mg total) by mouth in the morning AND 1 tablet (25 mg total) every evening. 225 tablet 1   Multiple Vitamin (MULTIVITAMIN WITH MINERALS) TABS tablet Take 1 tablet by mouth every evening. (Patient not taking: Reported on 08/31/2024)     Multiple Vitamins-Minerals (PRESERVISION AREDS 2) CAPS Take 2 capsules by mouth at bedtime. (Patient not taking: Reported on 08/31/2024)     Omega 3 1200 MG CAPS Take 1,200 mg by mouth daily at 2 am. (Patient not taking: Reported on 08/31/2024)     predniSONE  (DELTASONE ) 20 MG tablet Take 20 mg by mouth daily for 3-5 days (Patient not taking: Reported on 08/31/2024) 5 tablet 0   traZODone  (DESYREL ) 50 MG tablet Take 0.5-1 tablets (25-50 mg total) by mouth at bedtime as needed for sleep. (Patient not taking: Reported on 08/31/2024) 90 tablet 0   vitamin C (ASCORBIC ACID) 500 MG tablet Take 500 mg by mouth daily. (Patient not taking: Reported on 08/31/2024)     VITAMIN E PO Take 90 mg by mouth daily. (Patient not taking: Reported on 08/31/2024)     No current facility-administered medications for this encounter.     REVIEW OF SYSTEMS: Patient does report some recent issues with memory, otherwise she is generally doing well.  Pertinent ROS per HPI and a review of symptoms is otherwise negative.    PHYSICAL EXAM:  Wt Readings from Last 3 Encounters:  08/31/24 133 lb 9.6 oz (60.6 kg)  07/23/24 131 lb (59.4 kg)  02/18/24 134 lb (60.8 kg)   Temp Readings from Last 3 Encounters:   08/31/24 (!) 97.5 F (36.4 C)  02/18/24 (!) 97.5 F (36.4 C) (Oral)  08/14/23 98.2 F (36.8 C) (Temporal)   BP Readings from Last 3 Encounters:  08/31/24 116/77  07/23/24 124/72  02/18/24 122/72   Pulse Readings from Last 3 Encounters:  08/31/24 68  07/23/24 69  02/18/24 78   Pain Assessment Pain Score: 0-No pain (Pain in bilateral knees and  right shoulder only when moving.)/10   Physical Exam Vitals and nursing note reviewed.  Constitutional:      General: She is not in acute distress. HENT:     Head: Normocephalic.  Eyes:     Extraocular Movements: Extraocular  movements intact.  Cardiovascular:     Rate and Rhythm: Normal rate.  Pulmonary:     Effort: Pulmonary effort is normal. No respiratory distress.  Abdominal:     General: There is no distension.  Musculoskeletal:        General: No swelling.     Cervical back: Normal range of motion.  Skin:    General: Skin is dry.  Neurological:     General: No focal deficit present.     Mental Status: She is alert.     Comments: Ambulates with a cane  Psychiatric:        Mood and Affect: Mood normal.      LABORATORY DATA:  Lab Results  Component Value Date   WBC 7.7 02/05/2024   HGB 14.6 02/05/2024   HCT 42.6 02/05/2024   MCV 94.0 02/05/2024   PLT 268.0 02/05/2024   Lab Results  Component Value Date   NA 143 02/05/2024   K 4.0 02/05/2024   CL 105 02/05/2024   CO2 15 (L) 02/05/2024   Lab Results  Component Value Date   ALT 23 02/05/2024   AST 32 02/05/2024   ALKPHOS 97 02/05/2024   BILITOT 0.5 02/05/2024      RADIOGRAPHY:   XR Knee 3 Views Right 08/19/20:  XR Knee 3 Views Right Standing AP, lateral and sunrise views demonstrated advanced  tricompartmental degenerative disease with bone on bone medial  compartment, peripheral osteophytes and subchondral sclerosis.    XR Knee Left 09/18/2018:  Non-standing x-ray: Significant degenerative arthritis    XR Shoulder 2 Views Right  10/08/2017:  No acute findings. Significant glenohumeral joint  degenerative arthritis with large inferior humeral head osteophyte  formation. Humeral acromial distance appears to be fairly well maintained.   PATHOLOGY: No pertinent pathology     IMPRESSION/PLAN:   Ms. Cerino is an 89 yo F w/ a hx of moderate-severe multijoint arthritis most pronounced in her bilateral knees and R shoulder. After a detailed discussion of the patients history, prior treatment response, and current goals, we reviewed the proposed protocol for LDRT targeting the bilateral knees and R shoulder. Our institutional approach follows a regimen of 3 Gy total, delivered in 6 fractions of 0.5 Gy on a Monday-Wednesday-Friday schedule over two weeks. This dosing is consistent with published guidelines and peer-reviewed data for non-malignant musculoskeletal conditions.  We reviewed:   Goals of care: symptom relief and improved mobility Expected timeline for therapeutic benefit: typically several weeks to months Potential risks, including rare but theoretical concerns regarding late tissue effects or secondary malignancy (risk estimated to be extremely low at this dose and age) Lack of immunosuppression and inactive autoimmune history, minimizing concern for immune-mediated complications   My goal with low dose radiation therapy is to help reduce chronic joint pain and improve mobility, particularly for activities like walking. This treatment does not reverse joint damage, but it can calm the inflammation in and around the joint that contributes to pain. Based on published data, about 60-80% of patients with osteoarthritis experience meaningful pain relief within a few weeks to months after completing a short course of low-dose radiation. While not everyone responds, the majority of patients do report improvement, and the treatment is generally well tolerated.  The patient was agreeable to proceed. We will arrange CT  simulation for planning and initiate treatment pending final review and setup. A follow-up visit will be scheduled approximately 8-12 weeks after completion of LDRT to assess clinical response.   We personally  spent 60 minutes in this encounter including chart review, reviewing radiological studies, meeting face-to-face with the patient, entering orders and completing documentation.    Estefana HERO. Maritza, M.D.   "

## 2024-08-31 ENCOUNTER — Ambulatory Visit
Admission: RE | Admit: 2024-08-31 | Discharge: 2024-08-31 | Disposition: A | Discharge status: Home / Self Care | Source: Ambulatory Visit | Attending: Radiation Oncology | Admitting: Radiation Oncology

## 2024-08-31 ENCOUNTER — Encounter: Payer: Self-pay | Admitting: Radiation Oncology

## 2024-08-31 ENCOUNTER — Other Ambulatory Visit: Payer: Self-pay | Admitting: Family Medicine

## 2024-08-31 VITALS — BP 116/77 | HR 68 | Temp 97.5°F | Resp 24 | Ht 61.0 in | Wt 133.6 lb

## 2024-08-31 DIAGNOSIS — I10 Essential (primary) hypertension: Secondary | ICD-10-CM | POA: Insufficient documentation

## 2024-08-31 DIAGNOSIS — I4891 Unspecified atrial fibrillation: Secondary | ICD-10-CM | POA: Insufficient documentation

## 2024-08-31 DIAGNOSIS — L900 Lichen sclerosus et atrophicus: Secondary | ICD-10-CM | POA: Diagnosis not present

## 2024-08-31 DIAGNOSIS — Z809 Family history of malignant neoplasm, unspecified: Secondary | ICD-10-CM | POA: Insufficient documentation

## 2024-08-31 DIAGNOSIS — M170 Bilateral primary osteoarthritis of knee: Secondary | ICD-10-CM | POA: Diagnosis present

## 2024-08-31 DIAGNOSIS — Z79899 Other long term (current) drug therapy: Secondary | ICD-10-CM | POA: Insufficient documentation

## 2024-08-31 DIAGNOSIS — Z87891 Personal history of nicotine dependence: Secondary | ICD-10-CM | POA: Insufficient documentation

## 2024-08-31 DIAGNOSIS — M19011 Primary osteoarthritis, right shoulder: Secondary | ICD-10-CM | POA: Diagnosis not present

## 2024-08-31 DIAGNOSIS — Z853 Personal history of malignant neoplasm of breast: Secondary | ICD-10-CM | POA: Insufficient documentation

## 2024-08-31 DIAGNOSIS — Z801 Family history of malignant neoplasm of trachea, bronchus and lung: Secondary | ICD-10-CM | POA: Diagnosis not present

## 2024-08-31 DIAGNOSIS — Z7952 Long term (current) use of systemic steroids: Secondary | ICD-10-CM | POA: Insufficient documentation

## 2024-08-31 DIAGNOSIS — Z7901 Long term (current) use of anticoagulants: Secondary | ICD-10-CM | POA: Diagnosis not present

## 2024-09-01 ENCOUNTER — Telehealth: Payer: Self-pay

## 2024-09-01 ENCOUNTER — Telehealth: Payer: Self-pay | Admitting: Radiation Oncology

## 2024-09-01 NOTE — Telephone Encounter (Signed)
 12/30 Patient left voicemail for her CT Sim appt to be change to afternoon.  Email forward to CT Sim and copied Support RTT, so they are aware.

## 2024-09-01 NOTE — Telephone Encounter (Signed)
 Copied from CRM #[id number]. Topic: General - Other >> Sep 01, 2024 12:05 PM Alfonso HERO wrote: Reason for CRM: patient calling to inform Dr Ubaldo that she went to see Dr Rivka about the dose radiation treatments for arthritis and its going to be too many treatments for her to find reliable transportation.

## 2024-09-09 ENCOUNTER — Ambulatory Visit: Admitting: Radiation Oncology

## 2024-09-14 ENCOUNTER — Ambulatory Visit
Admission: RE | Admit: 2024-09-14 | Discharge: 2024-09-14 | Disposition: A | Discharge status: Home / Self Care | Source: Ambulatory Visit | Attending: Radiation Oncology | Admitting: Radiation Oncology

## 2024-09-14 DIAGNOSIS — M170 Bilateral primary osteoarthritis of knee: Secondary | ICD-10-CM | POA: Insufficient documentation

## 2024-09-25 ENCOUNTER — Telehealth: Payer: Self-pay | Admitting: Radiation Oncology

## 2024-09-25 NOTE — Telephone Encounter (Signed)
 1/23 Received call from patient to r/s her treatment appt from 1/26 due to bad weather.  Called Support RTT, transfer call as requested.

## 2024-09-28 ENCOUNTER — Ambulatory Visit: Admitting: Radiation Oncology

## 2024-09-30 ENCOUNTER — Ambulatory Visit: Admitting: Radiation Oncology

## 2024-10-02 ENCOUNTER — Ambulatory Visit

## 2024-10-05 ENCOUNTER — Ambulatory Visit: Admitting: Radiation Oncology

## 2024-10-06 ENCOUNTER — Telehealth: Payer: Self-pay

## 2024-10-06 DIAGNOSIS — I4891 Unspecified atrial fibrillation: Secondary | ICD-10-CM

## 2024-10-06 MED ORDER — APIXABAN 2.5 MG PO TABS: 2.5000 mg | ORAL_TABLET | Freq: Two times a day (BID) | ORAL | 3 refills | Status: AC

## 2024-10-06 NOTE — Addendum Note (Signed)
 Addended by: Tenia Goh D on: 10/06/2024 01:16 PM   Modules accepted: Orders

## 2024-10-07 ENCOUNTER — Ambulatory Visit

## 2024-10-09 ENCOUNTER — Ambulatory Visit

## 2024-10-12 ENCOUNTER — Ambulatory Visit

## 2024-10-13 ENCOUNTER — Ambulatory Visit: Admitting: Radiation Oncology

## 2024-10-14 ENCOUNTER — Ambulatory Visit

## 2024-10-15 ENCOUNTER — Ambulatory Visit

## 2024-10-16 ENCOUNTER — Ambulatory Visit

## 2024-10-19 ENCOUNTER — Ambulatory Visit: Admitting: Radiation Oncology

## 2024-10-20 ENCOUNTER — Ambulatory Visit

## 2024-10-22 ENCOUNTER — Ambulatory Visit

## 2024-10-27 ENCOUNTER — Ambulatory Visit: Admitting: Radiation Oncology

## 2024-10-29 ENCOUNTER — Ambulatory Visit

## 2025-02-16 ENCOUNTER — Ambulatory Visit
